# Patient Record
Sex: Female | Born: 1945 | Race: White | Hispanic: No | Marital: Married | Smoking: Former smoker
Health system: Southern US, Community
[De-identification: ages and names within clinical notes are randomized; demographics above are authoritative.]

## PROBLEM LIST (undated history)

## (undated) DIAGNOSIS — F32A Depression, unspecified: Secondary | ICD-10-CM

## (undated) DIAGNOSIS — I10 Essential (primary) hypertension: Secondary | ICD-10-CM

## (undated) DIAGNOSIS — F419 Anxiety disorder, unspecified: Secondary | ICD-10-CM

## (undated) DIAGNOSIS — F329 Major depressive disorder, single episode, unspecified: Secondary | ICD-10-CM

## (undated) DIAGNOSIS — I509 Heart failure, unspecified: Secondary | ICD-10-CM

## (undated) HISTORY — DX: Anxiety disorder, unspecified: F41.9

## (undated) HISTORY — PX: TONSILLECTOMY AND ADENOIDECTOMY: SUR1326

## (undated) HISTORY — PX: ABDOMINAL HYSTERECTOMY: SHX81

## (undated) HISTORY — DX: Essential (primary) hypertension: I10

## (undated) HISTORY — DX: Heart failure, unspecified: I50.9

## (undated) HISTORY — DX: Depression, unspecified: F32.A

## (undated) HISTORY — DX: Major depressive disorder, single episode, unspecified: F32.9

---

## 2006-06-13 ENCOUNTER — Ambulatory Visit: Payer: Self-pay | Admitting: Cardiology

## 2006-06-19 ENCOUNTER — Ambulatory Visit: Payer: Self-pay | Admitting: Cardiology

## 2006-06-26 ENCOUNTER — Ambulatory Visit: Payer: Self-pay | Admitting: Cardiology

## 2006-07-01 ENCOUNTER — Ambulatory Visit: Payer: Self-pay | Admitting: Cardiology

## 2008-09-26 ENCOUNTER — Ambulatory Visit: Payer: Self-pay | Admitting: Cardiology

## 2014-02-04 ENCOUNTER — Other Ambulatory Visit: Payer: Self-pay | Admitting: *Deleted

## 2014-02-04 ENCOUNTER — Inpatient Hospital Stay
Admission: RE | Admit: 2014-02-04 | Discharge: 2014-02-23 | Disposition: A | Discharge status: Home / Self Care | Payer: Medicare Other | Source: Ambulatory Visit | Attending: Internal Medicine | Admitting: Internal Medicine

## 2014-02-04 MED ORDER — ALPRAZOLAM 0.25 MG PO TABS: ORAL_TABLET | ORAL | Status: DC

## 2014-02-04 NOTE — Telephone Encounter (Signed)
rx faxed to [facility] @ [phone] 

## 2014-02-05 LAB — GLUCOSE, CAPILLARY
GLUCOSE-CAPILLARY: 171 mg/dL — AB (ref 70–99)
Glucose-Capillary: 181 mg/dL — ABNORMAL HIGH (ref 70–99)
Glucose-Capillary: 188 mg/dL — ABNORMAL HIGH (ref 70–99)

## 2014-02-06 LAB — GLUCOSE, CAPILLARY
GLUCOSE-CAPILLARY: 206 mg/dL — AB (ref 70–99)
Glucose-Capillary: 131 mg/dL — ABNORMAL HIGH (ref 70–99)
Glucose-Capillary: 252 mg/dL — ABNORMAL HIGH (ref 70–99)

## 2014-02-07 ENCOUNTER — Non-Acute Institutional Stay (SKILLED_NURSING_FACILITY): Payer: Medicare Other | Admitting: Internal Medicine

## 2014-02-07 ENCOUNTER — Encounter: Payer: Self-pay | Admitting: Internal Medicine

## 2014-02-07 DIAGNOSIS — F3289 Other specified depressive episodes: Secondary | ICD-10-CM

## 2014-02-07 DIAGNOSIS — F32A Depression, unspecified: Secondary | ICD-10-CM

## 2014-02-07 DIAGNOSIS — F329 Major depressive disorder, single episode, unspecified: Secondary | ICD-10-CM

## 2014-02-07 DIAGNOSIS — K7460 Unspecified cirrhosis of liver: Secondary | ICD-10-CM | POA: Insufficient documentation

## 2014-02-07 DIAGNOSIS — F411 Generalized anxiety disorder: Secondary | ICD-10-CM | POA: Insufficient documentation

## 2014-02-07 DIAGNOSIS — E039 Hypothyroidism, unspecified: Secondary | ICD-10-CM

## 2014-02-07 DIAGNOSIS — G459 Transient cerebral ischemic attack, unspecified: Secondary | ICD-10-CM

## 2014-02-07 DIAGNOSIS — I5030 Unspecified diastolic (congestive) heart failure: Secondary | ICD-10-CM

## 2014-02-07 DIAGNOSIS — I509 Heart failure, unspecified: Secondary | ICD-10-CM

## 2014-02-07 DIAGNOSIS — J189 Pneumonia, unspecified organism: Secondary | ICD-10-CM | POA: Insufficient documentation

## 2014-02-07 LAB — GLUCOSE, CAPILLARY
GLUCOSE-CAPILLARY: 216 mg/dL — AB (ref 70–99)
Glucose-Capillary: 206 mg/dL — ABNORMAL HIGH (ref 70–99)
Glucose-Capillary: 112 mg/dL — ABNORMAL HIGH (ref 70–99)
Glucose-Capillary: 108 mg/dL — ABNORMAL HIGH (ref 70–99)
Glucose-Capillary: 92 mg/dL (ref 70–99)

## 2014-02-07 NOTE — Progress Notes (Signed)
Patient ID: KESTREL MIS, female   DOB: 06/22/1946, 68 y.o.   MRN: [id number]   This is an acute visit.  Level of care skilled.  Facility [facility].  Chief complaint-acute visit status post hospitalization for CHF-pneumonia.  History of present illness.  Patient is a very pleasant 68 year old female who was admitted to [facility] with worsening shortness of breath and slurred speech.  There was concern for TIA initially-MRI only showed small vessel ischemia no definite CVA.  She also had a CT of the chest done-she was diagnosed with diastolic CHF as well as pneumonia.  She also received an abdominal ultrasound that showed mild ascites and cirrhosis of the liver.  Her ammonia level was slightly elevated at 46.  In regards to the CHF she was treated with diuresis Lasix 80 mg twice a day she has been discharged on 60 mg twice a day.  Aldactone and propanolol also were added  Cardiac echo did show a normal ejection fraction.  Patient was also seen by gastroenterologist and recommendation for followup for a colonoscopy.  Patient was also seen by pulmonology-she continues on Duo nebs 4 times a day for shortness of breath and also is completing a course of Levaquin for the pneumonia.  She is here for rehabilitation  currently lives in a duplex near this facility with her husband.  Previous medical history.  CHF likely diastolic.  Pneumonia resolving.  Cirrhosis via abdominal ultrasound.  Diabetes type 2 diet controlled.  Hypertension  Apparently some previous history of TIA.  GERD.  Vitamin D deficiency.  Hypothyroidism.  Deviated nasal septum.  Fibromyalgia.  Hyperlipidemia.  History of degenerative disc disease.  Depression.  Anxiety.  Seasonal allergies.  Urinary incontinence.  Previous surgeries.  Tonsillectomy -- adenoidectomy-hysterectomy  Family history. Father with history of MI and kidney problems.  Mother with history of lung  cancer  .  Social history.  She is a former smoker no significant history of alcohol or illicit drug use.  No particular dust or fume exposure that patient is aware of.  She is married and lives with her husband in a duplex near this facility.  She has 4 children 3 are still living    Hospital studies as noted below.  Cardio echo showed normal ejection fraction-with an ejection fraction of 65% and aortic valve gradient of 16 mm HG  aortic valve appeared to be calcified.  . CT of the chest did not show any evidence of pulmonary embolism did show bilateral pleural effusions with airspace disease consistent with pulmonary edema and heart failure although pneumonia not excluded.  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  MRI of the brain showed small vessel ischemia otherwise negative for acute process.  Medications.  Levaquin 500 mg daily until 02/11/2014.  Lasix 60 mg twice a day.  Potassium 10 mEq daily.  Effexor XR 75 mg twice a day.  Duo-nebs 4 times a day when necessary.  Aspirin 81 mg daily.  Aldactone 12.5 mg twice a day.  Tylenol 650 mg every 4 hours when necessary fever or pain.  Synthroid 112 mcg daily  Propanolol 10 mg twice a day.  Symbicort 160-4.5 mcg actuation 2 puffs twice a day inhale  Xanax 0.25 mg when necessary 3 times a day.  Review of systems.  In general no complaints of fever chills.  Skin does not complaining of any rash or itching.  Head ears eyes nose mouth and  throat-does not complaining of visual changes or sore throat.  Respiratory-history of pneumonia CHF but does not complaining of shortness of breath or cough currently.  Cardiac-does not complaining of chest pain or significant lower extremity edema.  GI-no complaints of nausea vomiting diarrhea constipation or abdominal pain.  Muscle skeletal-is not complaining of joint pain does have a history of DJD currently  controlled on current pain medications.  Neurologic-no complaints of syncope headache dizziness or numbness.  Psych does have a history of anxiety and depression at this point does not complaining of either   Physical exam.  Temperature is 97.7 pulse 56 respirations 20 blood pressure 127/74 O2 saturation is 95% on room air weight is 210.  In general this is a somewhat obese elderly female in no distress sitting comfortably in her chair.  Her skin is warm and dry.  Eyes pupils appear reactive to light sclera and conjunctiva are clear visual acuity appears grossly intact extraocular movements intact.  Oropharynx is clear mucous membranes moist.  Chest is clear to auscultation there is no labored breathing.  Heart is regular rate and rhythm without murmur gallop or rub she is slightly bradycardic in the 50s--quite minimal lower extremity edema.  Abdomen is soft nontender with positive bowel sounds somewhat obese.  Muscle skeletal-moves all extremities x4 I do not note any deformities she does ambulate with the assistance of a cane-grip strength appears to be intact and appropriate.  Neurologic is grossly intact speech is clear no lateralizing findings.  Psych she is alert and oriented x3 pleasant and appropriate.  Labs.  02/07/2014.  WBC 6.9 hemoglobin 10.1 platelets 155.  Sodium 137 potassium 4.2 BUN 16 creatinine 0.8.  Liver function tests within normal limits except a bilirubin of 1.4 AST of 43 and albumin of 2.8   Assessment and plan.  #1-history of diastolic CHF- continues on Lasix with potassium supplementation we'll order weights  Monday Wednesday Friday notify provider gain greater than 3 pounds Continues on propanolol as well as Aldactone as well.  #2-history of pneumonia she is completing course of Levaquin this appears to be stable and resolving clinically she is on nebulizers as needed as well as on Symbicort routinely.  History of diabetes type 2 apparently  this is diet controlled CBGs so far appear to be in the low 100s in the morning-more mid 100s at noon-higher 100s to 200 range at 4 PM as well as at at bedtime we'll continue to monitor for now  #4-history hypothyroidism-she is on Synthroid we'll update TSH.  #5-history of cirrhosis of liver-recommendation for outpatient GI consultation this has been written apparently she is scheduled to at some point get a colonoscopy as well--he  Also will update a CBC and liver function tests.  #6 past history depression she continues on Effexor will have  nursing staff check to make sure she is on the correct dose of twice a day this appears to be stable clinically.  #7-anxiety she is on Xanax as needed again this appears clinically to be stable.  #8 past history of anemia-hemoglobin appears to be stable - there was also some thrombocytopenia in hospital again this will be rechecked  #9-history of TIA-she is on aspirin   CPT--99310-of note greater than 40 minutes spent assessing patient-reviewing her hospital records- and coordinating and formulating a plan of care-of note greater than 50% of time spent coordinating plan of care.  .Marland Kitchen

## 2014-02-08 ENCOUNTER — Encounter: Payer: Self-pay | Admitting: Internal Medicine

## 2014-02-08 LAB — GLUCOSE, CAPILLARY
GLUCOSE-CAPILLARY: 237 mg/dL — AB (ref 70–99)
GLUCOSE-CAPILLARY: 307 mg/dL — AB (ref 70–99)
GLUCOSE-CAPILLARY: 269 mg/dL — AB (ref 70–99)
Glucose-Capillary: 138 mg/dL — ABNORMAL HIGH (ref 70–99)
Glucose-Capillary: 116 mg/dL — ABNORMAL HIGH (ref 70–99)
Glucose-Capillary: 270 mg/dL — ABNORMAL HIGH (ref 70–99)

## 2014-02-10 LAB — GLUCOSE, CAPILLARY
GLUCOSE-CAPILLARY: 172 mg/dL — AB (ref 70–99)
GLUCOSE-CAPILLARY: 269 mg/dL — AB (ref 70–99)
Glucose-Capillary: 162 mg/dL — ABNORMAL HIGH (ref 70–99)
Glucose-Capillary: 139 mg/dL — ABNORMAL HIGH (ref 70–99)
Glucose-Capillary: 181 mg/dL — ABNORMAL HIGH (ref 70–99)
Glucose-Capillary: 172 mg/dL — ABNORMAL HIGH (ref 70–99)
Glucose-Capillary: 258 mg/dL — ABNORMAL HIGH (ref 70–99)

## 2014-02-11 LAB — GLUCOSE, CAPILLARY
GLUCOSE-CAPILLARY: 200 mg/dL — AB (ref 70–99)
GLUCOSE-CAPILLARY: 203 mg/dL — AB (ref 70–99)
Glucose-Capillary: 101 mg/dL — ABNORMAL HIGH (ref 70–99)
Glucose-Capillary: 184 mg/dL — ABNORMAL HIGH (ref 70–99)

## 2014-02-12 LAB — GLUCOSE, CAPILLARY
Glucose-Capillary: 106 mg/dL — ABNORMAL HIGH (ref 70–99)
Glucose-Capillary: 154 mg/dL — ABNORMAL HIGH (ref 70–99)
Glucose-Capillary: 202 mg/dL — ABNORMAL HIGH (ref 70–99)
Glucose-Capillary: 228 mg/dL — ABNORMAL HIGH (ref 70–99)

## 2014-02-13 LAB — GLUCOSE, CAPILLARY
GLUCOSE-CAPILLARY: 105 mg/dL — AB (ref 70–99)
GLUCOSE-CAPILLARY: 311 mg/dL — AB (ref 70–99)

## 2014-02-14 LAB — GLUCOSE, CAPILLARY
GLUCOSE-CAPILLARY: 152 mg/dL — AB (ref 70–99)
GLUCOSE-CAPILLARY: 158 mg/dL — AB (ref 70–99)
GLUCOSE-CAPILLARY: 261 mg/dL — AB (ref 70–99)
Glucose-Capillary: 246 mg/dL — ABNORMAL HIGH (ref 70–99)
Glucose-Capillary: 114 mg/dL — ABNORMAL HIGH (ref 70–99)
Glucose-Capillary: 121 mg/dL — ABNORMAL HIGH (ref 70–99)
Glucose-Capillary: 141 mg/dL — ABNORMAL HIGH (ref 70–99)

## 2014-02-15 LAB — GLUCOSE, CAPILLARY
GLUCOSE-CAPILLARY: 229 mg/dL — AB (ref 70–99)
Glucose-Capillary: 114 mg/dL — ABNORMAL HIGH (ref 70–99)
Glucose-Capillary: 187 mg/dL — ABNORMAL HIGH (ref 70–99)
Glucose-Capillary: 142 mg/dL — ABNORMAL HIGH (ref 70–99)

## 2014-02-16 LAB — GLUCOSE, CAPILLARY
GLUCOSE-CAPILLARY: 211 mg/dL — AB (ref 70–99)
Glucose-Capillary: 129 mg/dL — ABNORMAL HIGH (ref 70–99)
Glucose-Capillary: 117 mg/dL — ABNORMAL HIGH (ref 70–99)

## 2014-02-17 LAB — GLUCOSE, CAPILLARY
GLUCOSE-CAPILLARY: 158 mg/dL — AB (ref 70–99)
GLUCOSE-CAPILLARY: 199 mg/dL — AB (ref 70–99)
Glucose-Capillary: 194 mg/dL — ABNORMAL HIGH (ref 70–99)
Glucose-Capillary: 128 mg/dL — ABNORMAL HIGH (ref 70–99)
Glucose-Capillary: 217 mg/dL — ABNORMAL HIGH (ref 70–99)

## 2014-02-18 LAB — GLUCOSE, CAPILLARY
GLUCOSE-CAPILLARY: 119 mg/dL — AB (ref 70–99)
GLUCOSE-CAPILLARY: 254 mg/dL — AB (ref 70–99)
Glucose-Capillary: 183 mg/dL — ABNORMAL HIGH (ref 70–99)
Glucose-Capillary: 159 mg/dL — ABNORMAL HIGH (ref 70–99)

## 2014-02-19 LAB — GLUCOSE, CAPILLARY
GLUCOSE-CAPILLARY: 131 mg/dL — AB (ref 70–99)
GLUCOSE-CAPILLARY: 197 mg/dL — AB (ref 70–99)
GLUCOSE-CAPILLARY: 175 mg/dL — AB (ref 70–99)
Glucose-Capillary: 286 mg/dL — ABNORMAL HIGH (ref 70–99)

## 2014-02-20 LAB — GLUCOSE, CAPILLARY
GLUCOSE-CAPILLARY: 117 mg/dL — AB (ref 70–99)
Glucose-Capillary: 194 mg/dL — ABNORMAL HIGH (ref 70–99)
Glucose-Capillary: 171 mg/dL — ABNORMAL HIGH (ref 70–99)
Glucose-Capillary: 265 mg/dL — ABNORMAL HIGH (ref 70–99)

## 2014-02-21 LAB — GLUCOSE, CAPILLARY
GLUCOSE-CAPILLARY: 109 mg/dL — AB (ref 70–99)
Glucose-Capillary: 172 mg/dL — ABNORMAL HIGH (ref 70–99)
Glucose-Capillary: 157 mg/dL — ABNORMAL HIGH (ref 70–99)
Glucose-Capillary: 166 mg/dL — ABNORMAL HIGH (ref 70–99)

## 2014-02-22 ENCOUNTER — Non-Acute Institutional Stay (SKILLED_NURSING_FACILITY): Payer: Medicare Other | Admitting: Internal Medicine

## 2014-02-22 ENCOUNTER — Encounter: Payer: Self-pay | Admitting: Internal Medicine

## 2014-02-22 DIAGNOSIS — J189 Pneumonia, unspecified organism: Secondary | ICD-10-CM

## 2014-02-22 DIAGNOSIS — F32A Depression, unspecified: Secondary | ICD-10-CM

## 2014-02-22 DIAGNOSIS — I509 Heart failure, unspecified: Secondary | ICD-10-CM

## 2014-02-22 DIAGNOSIS — E039 Hypothyroidism, unspecified: Secondary | ICD-10-CM

## 2014-02-22 DIAGNOSIS — F411 Generalized anxiety disorder: Secondary | ICD-10-CM

## 2014-02-22 DIAGNOSIS — F3289 Other specified depressive episodes: Secondary | ICD-10-CM

## 2014-02-22 DIAGNOSIS — F329 Major depressive disorder, single episode, unspecified: Secondary | ICD-10-CM

## 2014-02-22 DIAGNOSIS — G459 Transient cerebral ischemic attack, unspecified: Secondary | ICD-10-CM

## 2014-02-22 DIAGNOSIS — I5030 Unspecified diastolic (congestive) heart failure: Secondary | ICD-10-CM

## 2014-02-22 DIAGNOSIS — K7460 Unspecified cirrhosis of liver: Secondary | ICD-10-CM

## 2014-02-22 LAB — GLUCOSE, CAPILLARY
GLUCOSE-CAPILLARY: 249 mg/dL — AB (ref 70–99)
Glucose-Capillary: 132 mg/dL — ABNORMAL HIGH (ref 70–99)
Glucose-Capillary: 198 mg/dL — ABNORMAL HIGH (ref 70–99)
Glucose-Capillary: 160 mg/dL — ABNORMAL HIGH (ref 70–99)

## 2014-02-22 NOTE — Progress Notes (Signed)
Patient ID: Nancy Baldwin, female   DOB: 15-Jan-1946, 68 y.o.   MRN: [id number]   This is a discharge note.  Level of care skilled.  Facility [facility].   Chief complain-discharge note   History of present illness .  Patient is a very pleasant 68 year old female who was admitted to [facility] with worsening shortness of breath and slurred speech.  There was concern for TIA initially-MRI only showed small vessel ischemia no definite CVA.  She also had a CT of the chest done-she was diagnosed with diastolic CHF as well as pneumonia.  She also received an abdominal ultrasound that showed mild ascites and cirrhosis of the liver.  Her ammonia level was slightly elevated at 46.  In regards to the CHF she was treated with diuresis Lasix 80 mg twice a day she has been discharged on 60 mg twice a day.  Aldactone and propanolol also were added  Cardiac echo did show a normal ejection fraction.  Patient was also seen by gastroenterologist and recommendation for followup for a colonoscopy--they are  Following this.  Patient was also seen by pulmonology-she continues on Duo nebs 4 times a day for shortness of breath  But rarely needs this---and has completed course of Levaquin for the pneumonia.  She was here for rehabilitation currently lives in a duplex near this facility with her husband--who is very supportive in fact he is in the room with her today .  Previous medical history.  CHF likely diastolic.  Pneumonia resolving.  Cirrhosis via abdominal ultrasound.  Diabetes type 2 diet controlled.  Hypertension  Apparently some previous history of TIA.  GERD.  Vitamin D deficiency.  Hypothyroidism.  Deviated nasal septum.  Fibromyalgia.  Hyperlipidemia.  History of degenerative disc disease.  Depression.  Anxiety.  Seasonal allergies.  Urinary incontinence.  Previous surgeries.  Tonsillectomy -- adenoidectomy-hysterectomy  Family history.  Father with history of MI and kidney problems.    Mother with history of lung cancer  .  Social history.  She is a former smoker no significant history of alcohol or illicit drug use.  No particular dust or fume exposure that patient is aware of.  She is married and lives with her husband in a duplex near this facility.  She has 4 children 3 are still living   Hospital studies as noted below.  Cardio echo showed normal ejection fraction-with an ejection fraction of 65% and aortic valve gradient of 16 mm HG aortic valve appeared to be calcified.  .  CT of the chest did not show any evidence of pulmonary embolism did show bilateral pleural effusions with airspace disease consistent with pulmonary edema and heart failure although pneumonia not excluded.  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  MRI of the brain showed small vessel ischemia otherwise negative for acute process.   Medications.    Lasix 60 mg twice a day.  Potassium 10 mEq daily.  Effexor XR 75 mg twice a day.  Duo-nebs 4 times a day when necessary.  Aspirin 81 mg daily.  Aldactone 12.5 mg twice a day.  Tylenol 650 mg every 4 hours when necessary fever or pain.  Synthroid 112 mcg daily  Propanolol 10 mg twice a day.  Symbicort 160-4.5 mcg actuation 2 puffs twice a day inhale  Xanax 0.25 mg when necessary 3 times a day .  Review of systems.  In general no complaints of fever chills.  Skin  does not complaining of any rash or itching.  Head ears eyes nose mouth and throat-does not complaining of visual changes or sore throat.  Respiratory-history of pneumonia CHF but does not complaining of shortness of breath or cough currently.  Cardiac-does not complaining of chest pain or significant lower extremity edema.--no palpitations  GI-no complaints of nausea vomiting diarrhea constipation or abdominal pain.  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  Neurologic-no complaints of syncope headache dizziness or numbness.  Psych does have a history of anxiety and depression at this point does not complaining of either   Physical exam.  . temperature 97.6 pulse 68 respirations 20 blood pressure 130/44 weight is relatively stable at 214 she appears to fluctuate between 210-214 area  In general this is a somewhat obese elderly female in no distress s  Her skin is warm and dry.  Eyes pupils appear reactive to light sclera and conjunctiva are clear visual acuity appears grossly intact extraocular movements intact.  Oropharynx is clear mucous membranes moist.  Chest is clear to auscultation there is no labored breathing.  Heart is regular rate and rhythm without murmur gallop or rub --there is no lower extremity edema.  Abdomen is soft nontender with positive bowel sounds somewhat obese.  Muscle skeletal-moves all extremities x4 I do not note any deformities she does ambulate with the assistance of a cane-appears to ambulate fairly well  With some weakness... but would benefit from an assistance device i.e. rolling walker   Neurologic is grossly intact speech is clear no lateralizing findings.  Psych she is alert and oriented x3 pleasant and appropriate .  Labs. 02/10/2014.  WBC 7.7 hemoglobin 10.3 platelets 145.  Sodium 140 potassium 3.8 BUN 22 creatinine 0.97.  Liver function tests within normal limits except AST 39 and albumin of 2.9  TSH-3.392    02/07/2014.  WBC 6.9 hemoglobin 10.1 platelets 155.  Sodium 137 potassium 4.2 BUN 16 creatinine 0.8.  Liver function tests within normal limits except a bilirubin of 1.4 AST of 43 and albumin of 2.8   Assessment and plan .  #1-history of diastolic CHF- continues on Lasix with potassium supplementation --  Continues on propanolol as well as Aldactone as well-- she has been seen by cardiology and followup has been scheduled--Will update a metabolic panel.-- \ Clinically she  appears stable do not really see any significant edema no complaints of shortness of breath she does not really use her nebulizers anymore--does not require oxygen  #2-history of pneumonia she has completed a course of Levaquin this appears to be stable and resolving clinically she is on nebulizers as needed but not use these much at all as well as on Symbicort routinely.  History of diabetes type 2 apparently this is diet controlled --CBGs the morning  in the lower 100s and in the mid 100s-lower 200s later in the day will defer aggressive workup of this to her primary care provider #4-history hypothyroidism-she is on Synthroid TSH was within normal limits on lab done here  #5-history of cirrhosis of liver-recommendation for outpatient GI consultation--and she has seen them followup has been scheduled by GI   .  #6 past history depression she continues on Effexor-- this appears to be stable clinically.  #7-anxiety she is on Xanax as needed again this appears clinically to be stable.  #8 past history of anemia-hemoglobin appears to be stable - there was also some thrombocytopenia in  hospital  this will be rechecked  #9-history of TIA-she is on aspirin   10-history of deconditioning-she would benefit from continued PT and OT for her weakness also would benefit from home health to follow her numerous medical issues including CHF. Also will need a rolling walker secondary to her weakness with ambulation and fall risk  Again she will be going home to a house near the facility she lives with her husband  CPT--99316--of note greater than 30 minutes spent assessing patient and preparing this discharge summary-of note greater than 50% of time spent coordinating plan of care .  .Marland Kitchen

## 2014-02-23 LAB — GLUCOSE, CAPILLARY
Glucose-Capillary: 117 mg/dL — ABNORMAL HIGH (ref 70–99)
Glucose-Capillary: 173 mg/dL — ABNORMAL HIGH (ref 70–99)

## 2014-04-22 ENCOUNTER — Other Ambulatory Visit (HOSPITAL_BASED_OUTPATIENT_CLINIC_OR_DEPARTMENT_OTHER): Payer: Self-pay | Admitting: Internal Medicine

## 2014-08-15 ENCOUNTER — Other Ambulatory Visit (HOSPITAL_COMMUNITY): Payer: Self-pay | Admitting: Interventional Radiology

## 2014-08-15 ENCOUNTER — Telehealth (HOSPITAL_COMMUNITY): Payer: Self-pay | Admitting: Interventional Radiology

## 2014-08-15 DIAGNOSIS — M4854XA Collapsed vertebra, not elsewhere classified, thoracic region, initial encounter for fracture: Secondary | ICD-10-CM

## 2014-08-15 NOTE — Telephone Encounter (Signed)
Called pt, left VM for her to call to schedule new pt consult with Deveshwar. JM

## 2014-08-17 ENCOUNTER — Ambulatory Visit (HOSPITAL_COMMUNITY)
Admission: RE | Admit: 2014-08-17 | Discharge: 2014-08-17 | Disposition: A | Discharge status: Home / Self Care | Payer: Medicare Other | Source: Ambulatory Visit | Attending: Interventional Radiology | Admitting: Interventional Radiology

## 2014-08-17 DIAGNOSIS — M4854XA Collapsed vertebra, not elsewhere classified, thoracic region, initial encounter for fracture: Secondary | ICD-10-CM

## 2014-09-08 ENCOUNTER — Other Ambulatory Visit (HOSPITAL_COMMUNITY): Payer: Self-pay | Admitting: Interventional Radiology

## 2014-09-08 DIAGNOSIS — IMO0002 Reserved for concepts with insufficient information to code with codable children: Secondary | ICD-10-CM

## 2014-09-08 DIAGNOSIS — M546 Pain in thoracic spine: Secondary | ICD-10-CM

## 2014-09-16 ENCOUNTER — Other Ambulatory Visit: Payer: Self-pay | Admitting: Radiology

## 2014-09-21 ENCOUNTER — Other Ambulatory Visit: Payer: Self-pay | Admitting: Radiology

## 2014-09-22 ENCOUNTER — Ambulatory Visit (HOSPITAL_COMMUNITY)
Admission: RE | Admit: 2014-09-22 | Discharge: 2014-09-22 | Disposition: A | Discharge status: Home / Self Care | Payer: Medicare Other | Source: Ambulatory Visit | Attending: Interventional Radiology | Admitting: Interventional Radiology

## 2014-09-22 ENCOUNTER — Other Ambulatory Visit (HOSPITAL_COMMUNITY): Payer: Self-pay | Admitting: Interventional Radiology

## 2014-09-22 DIAGNOSIS — S22000S Wedge compression fracture of unspecified thoracic vertebra, sequela: Secondary | ICD-10-CM | POA: Diagnosis present

## 2014-09-22 DIAGNOSIS — I10 Essential (primary) hypertension: Secondary | ICD-10-CM | POA: Insufficient documentation

## 2014-09-22 DIAGNOSIS — M546 Pain in thoracic spine: Secondary | ICD-10-CM

## 2014-09-22 DIAGNOSIS — F419 Anxiety disorder, unspecified: Secondary | ICD-10-CM | POA: Insufficient documentation

## 2014-09-22 DIAGNOSIS — F329 Major depressive disorder, single episode, unspecified: Secondary | ICD-10-CM | POA: Insufficient documentation

## 2014-09-22 DIAGNOSIS — Z87891 Personal history of nicotine dependence: Secondary | ICD-10-CM | POA: Diagnosis not present

## 2014-09-22 DIAGNOSIS — Y92009 Unspecified place in unspecified non-institutional (private) residence as the place of occurrence of the external cause: Secondary | ICD-10-CM | POA: Insufficient documentation

## 2014-09-22 DIAGNOSIS — S22060S Wedge compression fracture of T7-T8 vertebra, sequela: Secondary | ICD-10-CM | POA: Insufficient documentation

## 2014-09-22 DIAGNOSIS — W19XXXA Unspecified fall, initial encounter: Secondary | ICD-10-CM | POA: Diagnosis not present

## 2014-09-22 DIAGNOSIS — I509 Heart failure, unspecified: Secondary | ICD-10-CM | POA: Diagnosis not present

## 2014-09-22 DIAGNOSIS — IMO0002 Reserved for concepts with insufficient information to code with codable children: Secondary | ICD-10-CM

## 2014-09-22 LAB — BASIC METABOLIC PANEL
ANION GAP: 10 (ref 5–15)
BUN: 24 mg/dL — ABNORMAL HIGH (ref 6–23)
CO2: 30 mEq/L (ref 19–32)
Calcium: 9.3 mg/dL (ref 8.4–10.5)
Chloride: 101 mEq/L (ref 96–112)
Creatinine, Ser: 1.17 mg/dL — ABNORMAL HIGH (ref 0.50–1.10)
GFR calc Af Amer: 55 mL/min — ABNORMAL LOW (ref >90)
GFR, EST NON AFRICAN AMERICAN: 47 mL/min — AB (ref >90)
Glucose, Bld: 127 mg/dL — ABNORMAL HIGH (ref 70–99)
Potassium: 4.2 mEq/L (ref 3.7–5.3)
SODIUM: 141 meq/L (ref 137–147)

## 2014-09-22 LAB — CBC
HCT: 38.3 % (ref 36.0–46.0)
Hemoglobin: 12.3 g/dL (ref 12.0–15.0)
MCH: 31 pg (ref 26.0–34.0)
MCHC: 32.1 g/dL (ref 30.0–36.0)
MCV: 96.5 fL (ref 78.0–100.0)
PLATELETS: 100 10*3/uL — AB (ref 150–400)
RBC: 3.97 MIL/uL (ref 3.87–5.11)
RDW: 15.1 % (ref 11.5–15.5)
WBC: 5.7 10*3/uL (ref 4.0–10.5)

## 2014-09-22 LAB — GLUCOSE, CAPILLARY: Glucose-Capillary: 116 mg/dL — ABNORMAL HIGH (ref 70–99)

## 2014-09-22 LAB — PROTIME-INR
INR: 1.4 (ref 0.00–1.49)
PROTHROMBIN TIME: 17.3 s — AB (ref 11.6–15.2)

## 2014-09-22 LAB — APTT: aPTT: 30 seconds (ref 24–37)

## 2014-09-22 MED ORDER — VANCOMYCIN HCL IN DEXTROSE 1-5 GM/200ML-% IV SOLN
1000.0000 mg | Freq: Once | INTRAVENOUS | Status: AC
  Administered 2014-09-22: 1000 mg via INTRAVENOUS
  Filled 2014-09-22: qty 200

## 2014-09-22 MED ORDER — TOBRAMYCIN SULFATE 1.2 G IJ SOLR
INTRAMUSCULAR | Status: AC
  Filled 2014-09-22: qty 1.2

## 2014-09-22 MED ORDER — IOHEXOL 300 MG/ML  SOLN
50.0000 mL | Freq: Once | INTRAMUSCULAR | Status: AC | PRN
  Administered 2014-09-22: 5 mL

## 2014-09-22 MED ORDER — HYDROMORPHONE HCL 1 MG/ML IJ SOLN
INTRAMUSCULAR | Status: AC
  Filled 2014-09-22: qty 2

## 2014-09-22 MED ORDER — SODIUM CHLORIDE 0.9 % IV SOLN
Freq: Once | INTRAVENOUS | Status: AC
  Administered 2014-09-22: 1000 mL via INTRAVENOUS

## 2014-09-22 MED ORDER — FENTANYL CITRATE 0.05 MG/ML IJ SOLN
INTRAMUSCULAR | Status: AC
  Filled 2014-09-22: qty 4

## 2014-09-22 MED ORDER — FENTANYL CITRATE 0.05 MG/ML IJ SOLN
INTRAMUSCULAR | Status: DC | PRN
  Administered 2014-09-22: 12.5 ug via INTRAVENOUS
  Administered 2014-09-22: 25 ug via INTRAVENOUS

## 2014-09-22 MED ORDER — MIDAZOLAM HCL 2 MG/2ML IJ SOLN
INTRAMUSCULAR | Status: DC | PRN
  Administered 2014-09-22: 0.5 mg via INTRAVENOUS
  Administered 2014-09-22: 1 mg via INTRAVENOUS

## 2014-09-22 MED ORDER — BUPIVACAINE HCL (PF) 0.25 % IJ SOLN
INTRAMUSCULAR | Status: AC
  Filled 2014-09-22: qty 30

## 2014-09-22 MED ORDER — MIDAZOLAM HCL 5 MG/ML IJ SOLN
INTRAMUSCULAR | Status: AC
  Filled 2014-09-22: qty 1

## 2014-09-22 MED ORDER — GELATIN ABSORBABLE 12-7 MM EX MISC
CUTANEOUS | Status: AC
  Filled 2014-09-22: qty 1

## 2014-09-22 MED ORDER — SODIUM CHLORIDE 0.9 % IV SOLN: INTRAVENOUS | Status: AC

## 2014-09-22 NOTE — Sedation Documentation (Signed)
Patient denies pain and is resting comfortably.  

## 2014-09-22 NOTE — Discharge Instructions (Signed)
1.No stooping,bending or lifting more than 10 lbs for 2 weeks 2.Use walker for ambulation for 2 weeks. 3.RTC in 2 weeks  KYPHOPLASTY/VERTEBROPLASTY DISCHARGE INSTRUCTIONS  Medications: (check all that apply)     Resume all home medications as before procedure.       Resume your (aspirin/Plavix/Coumadin) on [phone].                  Continue your pain medications as prescribed as needed.  Over the next 3-5 days, decrease your pain medication as tolerated.  Over the counter medications (i.e. Tylenol, ibuprofen, and aleve) may be substituted once severe/moderate pain symptoms have subsided.   Wound Care: - Bandages may be removed the day following your procedure.  You may get your incision wet once bandages are removed.  Bandaids may be used to cover the incisions until scab formation.  Topical ointments are optional.  - If you develop a fever greater than 101 degrees, have increased skin redness at the incision sites or pus-like oozing from incisions occurring within 1 week of the procedure, contact radiology at [phone] or [phone].  - Ice pack to back for 15-20 minutes 2-3 time per day for first 2-3 days post procedure.  The ice will expedite muscle healing and help with the pain from the incisions.   Activity: - Bedrest today with limited activity for 24 hours post procedure.  - No driving for 48 hours.  - Increase your activity as tolerated after bedrest (with assistance if necessary).  - Refrain from any strenuous activity or heavy lifting (greater than 10 lbs.).   Follow up: - Contact radiology at [phone] or [phone] if any questions/concerns.  - A physician assistant from radiology will contact you in approximately 1 week.  - If a biopsy was performed at the time of your procedure, your referring physician should receive the results in usually 2-3 days.

## 2014-09-22 NOTE — H&P (Signed)
Chief Complaint: Continued back pain Worsening pain T7/T8 fx  Referring Physician(s): Deveshwar,Sanjeev K  History of Present Illness: Nancy Baldwin is a 68 y.o. female   Pt fell at home in June 2015 Has had continued and worsening back pain MRI reveals acute fracture at Thoracic 7 and 8 Consulted with Dr Corliss Skainseveshwar Now scheduled for T7/8 vertebroplasty/kyphoplasty   Past Medical History  Diagnosis Date  . CHF (congestive heart failure)   . Depression   . Hypertension   . Anxiety   . Lung cancer     Past Surgical History  Procedure Laterality Date  . Abdominal hysterectomy    . Tonsillectomy and adenoidectomy      Allergies: Amoxicillin; Codeine; Decongest-aid; Decongestant; Metformin and related; and Tricor  Medications: Prior to Admission medications   Medication Sig Start Date End Date Taking? Authorizing Provider  acetaminophen (TYLENOL) 325 MG tablet Take 650 mg by mouth every 4 (four) hours as needed.   Yes Historical Provider, MD  ALPRAZolam (XANAX) 0.25 MG tablet Take 1 tablet by mouth 3 times daily as needed for Anxiety. 02/04/14  Yes Arlo C Trudie ReedLassen, PA-C  aspirin 81 MG tablet Take 81 mg by mouth daily.   Yes Historical Provider, MD  budesonide-formoterol (SYMBICORT) 160-4.5 MCG/ACT inhaler Inhale 2 puffs into the lungs 2 (two) times daily.   Yes Historical Provider, MD  furosemide (LASIX) 20 MG tablet Take 60 mg by mouth 2 (two) times daily.    Yes Historical Provider, MD  insulin glargine (LANTUS) 100 unit/mL SOPN Inject 30 Units into the skin daily before breakfast.   Yes Historical Provider, MD  ipratropium-albuterol (DUONEB) 0.5-2.5 (3) MG/3ML SOLN Take 3 mLs by nebulization every 6 (six) hours as needed.   Yes Historical Provider, MD  levothyroxine (SYNTHROID, LEVOTHROID) 112 MCG tablet Take 112 mcg by mouth daily before breakfast.   Yes Historical Provider, MD  levothyroxine (SYNTHROID, LEVOTHROID) 88 MCG tablet Take 88 mcg by mouth daily before  breakfast.   Yes Historical Provider, MD  potassium chloride (K-DUR) 10 MEQ tablet Take 10 mEq by mouth daily.   Yes Historical Provider, MD  propranolol (INDERAL) 10 MG tablet Take 10 mg by mouth 2 (two) times daily.   Yes Historical Provider, MD  propranolol ER (INDERAL LA) 60 MG 24 hr capsule Take 60 mg by mouth daily.   Yes Historical Provider, MD  spironolactone (ALDACTONE) 25 MG tablet Take 12.5 mg by mouth 2 (two) times daily.   Yes Historical Provider, MD  venlafaxine XR (EFFEXOR-XR) 75 MG 24 hr capsule Take 75 mg by mouth 2 (two) times daily.   Yes Historical Provider, MD    Family History  Problem Relation Age of Onset  . Lung cancer Mother   . Heart disease Father   . Kidney disease Father   . Heart attack Father     History   Social History  . Marital Status: Married    Spouse Name: N/A    Number of Children: N/A  . Years of Education: N/A   Social History Main Topics  . Smoking status: Former Games developermoker  . Smokeless tobacco: Not on file  . Alcohol Use: No  . Drug Use: No  . Sexual Activity: Not on file   Other Topics Concern  . Not on file   Social History Narrative  . No narrative on file     Review of Systems: A 12 point ROS discussed and pertinent positives are indicated in the HPI above.  All other  systems are negative.  Review of Systems  Constitutional: Positive for activity change and fatigue. Negative for appetite change.  Respiratory: Negative for cough and shortness of breath.   Cardiovascular: Negative for chest pain.  Gastrointestinal: Negative for abdominal pain.  Genitourinary: Negative for difficulty urinating.  Musculoskeletal: Positive for back pain.  Neurological: Positive for weakness. Negative for dizziness, facial asymmetry and headaches.  Psychiatric/Behavioral: Negative for behavioral problems and confusion.    Vital Signs: BP 112/57 mmHg  Pulse 49  Temp(Src) 97.7 F (36.5 C) (Oral)  Resp 20  Ht 5\' 6"  (1.676 m)  Wt 93.895 kg  (207 lb)  BMI 33.43 kg/m2  SpO2 100%  Physical Exam  Constitutional: She is oriented to person, place, and time. She appears well-nourished.  Cardiovascular: Normal rate and regular rhythm.   Murmur heard. Pulmonary/Chest: Effort normal and breath sounds normal. She has no wheezes.  Abdominal: Soft. Bowel sounds are normal. There is no tenderness.  Musculoskeletal: Normal range of motion.  Back pain  Neurological: She is alert and oriented to person, place, and time.  Skin: Skin is warm and dry.  Psychiatric: She has a normal mood and affect. Her behavior is normal. Judgment and thought content normal.  Nursing note and vitals reviewed.   Imaging: No results found.  Labs:  CBC:  Recent Labs  09/22/14 0718  WBC 5.7  HGB 12.3  HCT 38.3  PLT PENDING    COAGS: No results for input(s): INR, APTT in the last 8760 hours.  BMP:  Recent Labs  09/22/14 0718  NA 141  K 4.2  CL 101  CO2 30  GLUCOSE 127*  BUN 24*  CALCIUM 9.3  CREATININE 1.17*  GFRNONAA 47*  GFRAA 55*    LIVER FUNCTION TESTS: No results for input(s): BILITOT, AST, ALT, ALKPHOS, PROT, ALBUMIN in the last 8760 hours.  TUMOR MARKERS: No results for input(s): AFPTM, CEA, CA199, CHROMGRNA in the last 8760 hours.  Assessment and Plan:  Worsening back pain post fall 04/2014 MRI reveals fx T7/8 Now scheduled for VP/KP Pt aware of procedure benefits and risks and agreeable to proceed Consent signed andin chart  Thank you for this interesting consult.  I greatly enjoyed meeting Nancy Baldwin and look forward to participating in their care.    I spent a total of 20 minutes face to face in clinical consultation, greater than 50% of which was counseling/coordinating care for T7/8 VP/KP  Signed: Jonell Krontz A 09/22/2014, 8:08 AM

## 2014-09-22 NOTE — Procedures (Signed)
S/P T7 and T 8 VP 

## 2014-09-27 ENCOUNTER — Other Ambulatory Visit (HOSPITAL_COMMUNITY): Payer: Self-pay | Admitting: Interventional Radiology

## 2014-09-27 DIAGNOSIS — S22000A Wedge compression fracture of unspecified thoracic vertebra, initial encounter for closed fracture: Secondary | ICD-10-CM

## 2014-12-02 ENCOUNTER — Ambulatory Visit (HOSPITAL_COMMUNITY): Admission: RE | Admit: 2014-12-02 | Payer: Self-pay | Source: Ambulatory Visit

## 2014-12-07 ENCOUNTER — Ambulatory Visit (HOSPITAL_COMMUNITY)
Admission: RE | Admit: 2014-12-07 | Discharge: 2014-12-07 | Disposition: A | Discharge status: Home / Self Care | Payer: Medicare Other | Source: Ambulatory Visit | Attending: Interventional Radiology | Admitting: Interventional Radiology

## 2014-12-07 DIAGNOSIS — S22000A Wedge compression fracture of unspecified thoracic vertebra, initial encounter for closed fracture: Secondary | ICD-10-CM

## 2017-02-18 ENCOUNTER — Emergency Department (HOSPITAL_COMMUNITY): Payer: Medicare Other

## 2017-02-18 ENCOUNTER — Emergency Department (HOSPITAL_COMMUNITY)
Admission: EM | Admit: 2017-02-18 | Discharge: 2017-02-18 | Disposition: A | Discharge status: Home / Self Care | Payer: Medicare Other | Attending: Emergency Medicine | Admitting: Emergency Medicine

## 2017-02-18 ENCOUNTER — Encounter (HOSPITAL_COMMUNITY): Payer: Self-pay

## 2017-02-18 DIAGNOSIS — W182XXA Fall in (into) shower or empty bathtub, initial encounter: Secondary | ICD-10-CM | POA: Insufficient documentation

## 2017-02-18 DIAGNOSIS — S8002XA Contusion of left knee, initial encounter: Secondary | ICD-10-CM | POA: Diagnosis not present

## 2017-02-18 DIAGNOSIS — Z7982 Long term (current) use of aspirin: Secondary | ICD-10-CM | POA: Diagnosis not present

## 2017-02-18 DIAGNOSIS — S8001XA Contusion of right knee, initial encounter: Secondary | ICD-10-CM | POA: Diagnosis not present

## 2017-02-18 DIAGNOSIS — Z79899 Other long term (current) drug therapy: Secondary | ICD-10-CM | POA: Diagnosis not present

## 2017-02-18 DIAGNOSIS — S42201A Unspecified fracture of upper end of right humerus, initial encounter for closed fracture: Secondary | ICD-10-CM | POA: Insufficient documentation

## 2017-02-18 DIAGNOSIS — I110 Hypertensive heart disease with heart failure: Secondary | ICD-10-CM | POA: Diagnosis not present

## 2017-02-18 DIAGNOSIS — Z794 Long term (current) use of insulin: Secondary | ICD-10-CM | POA: Insufficient documentation

## 2017-02-18 DIAGNOSIS — I509 Heart failure, unspecified: Secondary | ICD-10-CM | POA: Diagnosis not present

## 2017-02-18 DIAGNOSIS — W19XXXA Unspecified fall, initial encounter: Secondary | ICD-10-CM

## 2017-02-18 DIAGNOSIS — Y92009 Unspecified place in unspecified non-institutional (private) residence as the place of occurrence of the external cause: Secondary | ICD-10-CM

## 2017-02-18 DIAGNOSIS — Z87891 Personal history of nicotine dependence: Secondary | ICD-10-CM | POA: Insufficient documentation

## 2017-02-18 DIAGNOSIS — Y929 Unspecified place or not applicable: Secondary | ICD-10-CM | POA: Insufficient documentation

## 2017-02-18 DIAGNOSIS — S42291A Other displaced fracture of upper end of right humerus, initial encounter for closed fracture: Secondary | ICD-10-CM

## 2017-02-18 DIAGNOSIS — Y93E1 Activity, personal bathing and showering: Secondary | ICD-10-CM | POA: Insufficient documentation

## 2017-02-18 DIAGNOSIS — S8000XA Contusion of unspecified knee, initial encounter: Secondary | ICD-10-CM

## 2017-02-18 DIAGNOSIS — S6991XA Unspecified injury of right wrist, hand and finger(s), initial encounter: Secondary | ICD-10-CM | POA: Diagnosis present

## 2017-02-18 DIAGNOSIS — Y999 Unspecified external cause status: Secondary | ICD-10-CM | POA: Insufficient documentation

## 2017-02-18 MED ORDER — OXYCODONE-ACETAMINOPHEN 5-325 MG PO TABS
1.0000 | ORAL_TABLET | Freq: Once | ORAL | Status: AC
  Administered 2017-02-18: 1 via ORAL
  Filled 2017-02-18: qty 1

## 2017-02-18 MED ORDER — OXYCODONE-ACETAMINOPHEN 5-325 MG PO TABS: 1.0000 | ORAL_TABLET | Freq: Four times a day (QID) | ORAL | 0 refills | Status: DC | PRN

## 2017-02-18 NOTE — ED Notes (Signed)
Patient returned to room. 

## 2017-02-18 NOTE — Discharge Instructions (Signed)
Wear the sling to stabilize your fracture. Use ice packs over your shoulder for comfort and to keep the swelling down. Take the percocet for pain. Call Dr Mort Sawyers office to get an appointment to have him recheck your fracture.  Return to the ED if your fingers get painful or numb or get cold and pale.

## 2017-02-18 NOTE — ED Notes (Signed)
Pt made aware to return if symptoms worsen or if any life threatening symptoms occur.   

## 2017-02-18 NOTE — ED Notes (Signed)
ED Provider at bedside. 

## 2017-02-18 NOTE — ED Triage Notes (Signed)
Patient slipped and fell in the bathtub around 3 hours ago.  Did not get dizzy.  There was a lot of soap in the bathtub and I slipped and fell.  Did not hid her head, did not lose consciousness.  Having pain in her right lower arm below her elbow.  She was taking a shower, did not sit in cold water.  Abrasions noted to bi-lateral knees.  Scattered bruising noted to right arm.

## 2017-02-18 NOTE — ED Provider Notes (Signed)
[facility]Y DEPT Provider Note   CSN: [id number] Arrival date & time: 02/18/17  0219  Time sen 02:40 AM   History   Chief Complaint Chief Complaint  Patient presents with  . Fall    HPI Nancy Baldwin is a 71 y.o. female.  HPI patient reports she was taking a shower and the floor of the bathtub was slippery from soap. She states when she turned off the water she fell in tub. She really can't tell me how she fell, whether she fell backwards forwards all she knows that she ended up in the tub. She denies hitting her arm on anything. She states however she heard a significant pop and she complains of pain in her forearm. She denies hitting her head or having loss of consciousness. She denies any numbness in her fingers. Patient states she is right-handed.  PCP Ignatius Specking, MD   Past Medical History:  Diagnosis Date  . Anxiety   . CHF (congestive heart failure) (HCC)   . Depression   . Hypertension   . Lung cancer [facility])     Patient Active Problem List   Diagnosis Date Noted  . Diastolic CHF (HCC) 02/07/2014  . Pneumonia 02/07/2014  . Cirrhosis (HCC) 02/07/2014  . Unspecified hypothyroidism 02/07/2014  . Anxiety state, unspecified 02/07/2014  . Depression 02/07/2014  . TIA (transient ischemic attack) 02/07/2014    Past Surgical History:  Procedure Laterality Date  . ABDOMINAL HYSTERECTOMY    . TONSILLECTOMY AND ADENOIDECTOMY      OB History    No data available       Home Medications    Prior to Admission medications   Medication Sig Start Date End Date Taking? Authorizing Provider  acetaminophen (TYLENOL) 325 MG tablet Take 650 mg by mouth every 4 (four) hours as needed.   Yes Historical Provider, MD  ALPRAZolam (XANAX) 0.25 MG tablet Take 1 tablet by mouth 3 times daily as needed for Anxiety. 02/04/14  Yes Arlo C Trudie Reed, PA-C  aspirin 81 MG tablet Take 81 mg by mouth daily.   Yes Historical Provider, MD  furosemide (LASIX) 20 MG tablet Take 60 mg by mouth  2 (two) times daily.    Yes Historical Provider, MD  insulin glargine (LANTUS) 100 unit/mL SOPN Inject 30 Units into the skin daily before breakfast.   Yes Historical Provider, MD  levothyroxine (SYNTHROID, LEVOTHROID) 112 MCG tablet Take 112 mcg by mouth daily before breakfast.   Yes Historical Provider, MD  levothyroxine (SYNTHROID, LEVOTHROID) 88 MCG tablet Take 88 mcg by mouth daily before breakfast.   Yes Historical Provider, MD  potassium chloride (K-DUR) 10 MEQ tablet Take 10 mEq by mouth daily.   Yes Historical Provider, MD  propranolol (INDERAL) 10 MG tablet Take 10 mg by mouth 2 (two) times daily.   Yes Historical Provider, MD  propranolol ER (INDERAL LA) 60 MG 24 hr capsule Take 60 mg by mouth daily.   Yes Historical Provider, MD  spironolactone (ALDACTONE) 25 MG tablet Take 12.5 mg by mouth 2 (two) times daily.   Yes Historical Provider, MD  venlafaxine XR (EFFEXOR-XR) 75 MG 24 hr capsule Take 75 mg by mouth 2 (two) times daily.   Yes Historical Provider, MD  budesonide-formoterol (SYMBICORT) 160-4.5 MCG/ACT inhaler Inhale 2 puffs into the lungs 2 (two) times daily.    Historical Provider, MD  ipratropium-albuterol (DUONEB) 0.5-2.5 (3) MG/3ML SOLN Take 3 mLs by nebulization every 6 (six) hours as needed.    Historical Provider,  MD  oxyCODONE-acetaminophen (PERCOCET/ROXICET) 5-325 MG tablet Take 1 tablet by mouth every 6 (six) hours as needed for severe pain. 02/18/17   Devoria Albe, MD    Family History Family History  Problem Relation Age of Onset  . Lung cancer Mother   . Heart disease Father   . Kidney disease Father   . Heart attack Father     Social History Social History  Substance Use Topics  . Smoking status: Former Games developer  . Smokeless tobacco: Never Used  . Alcohol use No  lives at home Lives with spouse   Allergies   Amoxicillin; Codeine; Decongest-aid [pseudoephedrine]; Decongestant [oxymetazoline]; Metformin and related; and Tricor [fenofibrate]   Review of  Systems Review of Systems  All other systems reviewed and are negative.    Physical Exam Updated Vital Signs BP 112/77 (BP Location: Left Arm)   Pulse (!) 58   Temp 99 F (37.2 C) (Oral)   Resp 16   Ht  (1.702 m)   Wt 207 lb (93.9 kg)   SpO2 99%   BMI 32.42 kg/m   Vital signs normal except bradycardia   Physical Exam  Constitutional: She is oriented to person, place, and time. She appears well-developed and well-nourished.  Non-toxic appearance. She does not appear ill. No distress.  obese  HENT:  Head: Normocephalic and atraumatic.  Right Ear: External ear normal.  Left Ear: External ear normal.  Nose: Nose normal. No mucosal edema or rhinorrhea.  Mouth/Throat: Oropharynx is clear and moist and mucous membranes are normal. No dental abscesses or uvula swelling.  Eyes: Conjunctivae and EOM are normal. Pupils are equal, round, and reactive to light.  Neck: Normal range of motion and full passive range of motion without pain. Neck supple.  Cardiovascular: Normal rate, regular rhythm and normal heart sounds.  Exam reveals no gallop and no friction rub.   No murmur heard. Pulmonary/Chest: Effort normal and breath sounds normal. No respiratory distress. She has no wheezes. She has no rhonchi. She has no rales. She exhibits no tenderness and no crepitus.  Abdominal: Soft. Normal appearance and bowel sounds are normal. She exhibits no distension. There is no tenderness. There is no rebound and no guarding.  Musculoskeletal: Normal range of motion. She exhibits tenderness.  Moves all extremities well except her LUE. Patient points to her elbow and proximal forearm as to where her pain is located. When she dorsiflexes her hand it causes pain in her forearm. She is unable to supinate her hand because of pain in her elbow. She has good distal pulses and capillary refill in her fingers.  She is also noted to have bilateral knee pain without effusion. She has some red superficial  epidermal abrasions on both knees. She thinks those are from a prior injury not from tonight.  Neurological: She is alert and oriented to person, place, and time. She has normal strength. No cranial nerve deficit.  Skin: Skin is warm, dry and intact. No rash noted. No erythema. No pallor.  Psychiatric: She has a normal mood and affect. Her speech is normal and behavior is normal. Her mood appears not anxious.  Nursing note and vitals reviewed.    ED Treatments / Results  Labs (all labs ordered are listed, but only abnormal results are displayed) Labs Reviewed - No data to display  EKG  EKG Interpretation None       Radiology Dg Elbow Complete Right  Result Date: 02/18/2017 CLINICAL DATA:  Status post fall, with diffuse  right elbow pain. Initial encounter. EXAM: RIGHT ELBOW - COMPLETE 3+ VIEW COMPARISON:  None. FINDINGS: There is no evidence of fracture or dislocation. The visualized joint spaces are preserved. No significant joint effusion is identified. The soft tissues are unremarkable in appearance. IMPRESSION: No evidence of fracture or dislocation. Electronically Signed   By: Roanna Raider M.D.   On: 02/18/2017 04:53   Dg Forearm Right  Result Date: 02/18/2017 CLINICAL DATA:  Status post fall, with right forearm pain. Initial encounter. EXAM: RIGHT FOREARM - 2 VIEW COMPARISON:  None. FINDINGS: There is no evidence of fracture or dislocation. The radius and ulna appear grossly intact. The elbow joint is incompletely assessed, but appears grossly unremarkable. No elbow joint effusion is seen. Negative ulnar variance is noted. The carpal rows appear grossly intact, and demonstrate normal alignment. No definite soft abnormalities are characterized on radiograph. IMPRESSION: No evidence of fracture or dislocation. Electronically Signed   By: Roanna Raider M.D.   On: 02/18/2017 03:34   Dg Knee Complete 4 Views Left  Result Date: 02/18/2017 CLINICAL DATA:  Status post fall, with left  knee pain. Initial encounter. EXAM: LEFT KNEE - COMPLETE 4+ VIEW COMPARISON:  None. FINDINGS: There is no evidence of fracture or dislocation. The joint spaces are preserved. No significant degenerative change is seen; the patellofemoral joint is grossly unremarkable in appearance. No significant joint effusion is seen. The visualized soft tissues are normal in appearance. IMPRESSION: No evidence of fracture or dislocation. Electronically Signed   By: Roanna Raider M.D.   On: 02/18/2017 03:34   Dg Knee Complete 4 Views Right  Result Date: 02/18/2017 CLINICAL DATA:  Status post fall, with right knee pain. Initial encounter. EXAM: RIGHT KNEE - COMPLETE 4+ VIEW COMPARISON:  None. FINDINGS: There is no evidence of fracture or dislocation. The joint spaces are preserved. No significant degenerative change is seen; the patellofemoral joint is grossly unremarkable in appearance. No significant joint effusion is seen. The visualized soft tissues are normal in appearance. IMPRESSION: No evidence of fracture or dislocation. Electronically Signed   By: Roanna Raider M.D.   On: 02/18/2017 03:34   Dg Humerus Right  Result Date: 02/18/2017 CLINICAL DATA:  Fall in the bathtub EXAM: RIGHT HUMERUS - 2+ VIEW COMPARISON:  Right elbow radiograph 02/18/2017 FINDINGS: There is a comminuted, moderately displaced and angulated fracture of the proximal right humerus. The distal right humerus is unremarkable. IMPRESSION: Comminuted, moderately displaced and angulated fracture of the humeral head. Electronically Signed   By: Deatra Robinson M.D.   On: 02/18/2017 06:21    Procedures Procedures (including critical care time)  Medications Ordered in ED Medications  oxyCODONE-acetaminophen (PERCOCET/ROXICET) 5-325 MG per tablet 1 tablet (1 tablet Oral Given 02/18/17 1610)     Initial Impression / Assessment and Plan / ED Course  I have reviewed the triage vital signs and the nursing notes.  Pertinent labs & imaging results  that were available during my care of the patient were reviewed by me and considered in my medical decision making (see chart for details).  X-rays were ordered of her right forearm which she states is where her pain is located.  After her first set of x-rays returned patient still was having a lot of pain. I read exam in terms she again said it was painful in her elbow. Since the elbow was not well visualized on the other x-rays an elbow x-ray was ordered.  After reviewing her elbow x-rays went back and examined patient again.  She again points to her elbow area in her proximal forearm as to where her pain is located. However when I palpate up above her arm she seems painful there also. Humerus x-ray was ordered.  After reviewing her humerus x-ray patient was placed in a shoulder immobilizer and given Percocet for pain. She will be given orthopedic referral.  Review of the [place] shows no injuries for the past 6 months.  Final Clinical Impressions(s) / ED Diagnoses   Final diagnoses:  Fall in home, initial encounter  Closed fracture of head of right humerus, initial encounter  Contusion of knee, unspecified laterality, initial encounter    New Prescriptions New Prescriptions   OXYCODONE-ACETAMINOPHEN (PERCOCET/ROXICET) 5-325 MG TABLET    Take 1 tablet by mouth every 6 (six) hours as needed for severe pain.    Plan discharge  Devoria Albe, MD, Concha Pyo, MD 02/18/17 [phone]

## 2017-02-18 NOTE — ED Notes (Signed)
Po fluids given to patient at this time.

## 2017-06-12 ENCOUNTER — Encounter (HOSPITAL_COMMUNITY): Payer: Self-pay | Admitting: Emergency Medicine

## 2017-06-12 ENCOUNTER — Inpatient Hospital Stay (HOSPITAL_COMMUNITY)
Admission: EM | Admit: 2017-06-12 | Discharge: 2017-06-19 | DRG: 542 | Disposition: A | Discharge status: Skilled Nursing Facility | Payer: Medicare Other | Attending: Internal Medicine | Admitting: Internal Medicine

## 2017-06-12 ENCOUNTER — Emergency Department (HOSPITAL_COMMUNITY): Payer: Medicare Other

## 2017-06-12 DIAGNOSIS — E1169 Type 2 diabetes mellitus with other specified complication: Secondary | ICD-10-CM | POA: Diagnosis present

## 2017-06-12 DIAGNOSIS — E119 Type 2 diabetes mellitus without complications: Secondary | ICD-10-CM | POA: Diagnosis present

## 2017-06-12 DIAGNOSIS — Z801 Family history of malignant neoplasm of trachea, bronchus and lung: Secondary | ICD-10-CM

## 2017-06-12 DIAGNOSIS — F419 Anxiety disorder, unspecified: Secondary | ICD-10-CM | POA: Diagnosis present

## 2017-06-12 DIAGNOSIS — Z888 Allergy status to other drugs, medicaments and biological substances status: Secondary | ICD-10-CM

## 2017-06-12 DIAGNOSIS — D696 Thrombocytopenia, unspecified: Secondary | ICD-10-CM | POA: Diagnosis present

## 2017-06-12 DIAGNOSIS — I5033 Acute on chronic diastolic (congestive) heart failure: Secondary | ICD-10-CM | POA: Diagnosis present

## 2017-06-12 DIAGNOSIS — M4855XA Collapsed vertebra, not elsewhere classified, thoracolumbar region, initial encounter for fracture: Principal | ICD-10-CM | POA: Diagnosis present

## 2017-06-12 DIAGNOSIS — R296 Repeated falls: Secondary | ICD-10-CM | POA: Diagnosis present

## 2017-06-12 DIAGNOSIS — Y92012 Bathroom of single-family (private) house as the place of occurrence of the external cause: Secondary | ICD-10-CM

## 2017-06-12 DIAGNOSIS — Z87891 Personal history of nicotine dependence: Secondary | ICD-10-CM

## 2017-06-12 DIAGNOSIS — I5030 Unspecified diastolic (congestive) heart failure: Secondary | ICD-10-CM | POA: Diagnosis present

## 2017-06-12 DIAGNOSIS — Z6841 Body Mass Index (BMI) 40.0 and over, adult: Secondary | ICD-10-CM

## 2017-06-12 DIAGNOSIS — K7682 Hepatic encephalopathy: Secondary | ICD-10-CM | POA: Diagnosis present

## 2017-06-12 DIAGNOSIS — Z880 Allergy status to penicillin: Secondary | ICD-10-CM

## 2017-06-12 DIAGNOSIS — S2242XK Multiple fractures of ribs, left side, subsequent encounter for fracture with nonunion: Secondary | ICD-10-CM | POA: Diagnosis present

## 2017-06-12 DIAGNOSIS — R52 Pain, unspecified: Secondary | ICD-10-CM | POA: Diagnosis present

## 2017-06-12 DIAGNOSIS — E039 Hypothyroidism, unspecified: Secondary | ICD-10-CM | POA: Diagnosis present

## 2017-06-12 DIAGNOSIS — M4856XA Collapsed vertebra, not elsewhere classified, lumbar region, initial encounter for fracture: Secondary | ICD-10-CM | POA: Diagnosis present

## 2017-06-12 DIAGNOSIS — Z85118 Personal history of other malignant neoplasm of bronchus and lung: Secondary | ICD-10-CM

## 2017-06-12 DIAGNOSIS — S2242XA Multiple fractures of ribs, left side, initial encounter for closed fracture: Secondary | ICD-10-CM | POA: Diagnosis present

## 2017-06-12 DIAGNOSIS — K7200 Acute and subacute hepatic failure without coma: Secondary | ICD-10-CM | POA: Diagnosis present

## 2017-06-12 DIAGNOSIS — K7460 Unspecified cirrhosis of liver: Secondary | ICD-10-CM | POA: Diagnosis present

## 2017-06-12 DIAGNOSIS — Z90710 Acquired absence of both cervix and uterus: Secondary | ICD-10-CM

## 2017-06-12 DIAGNOSIS — S22080A Wedge compression fracture of T11-T12 vertebra, initial encounter for closed fracture: Secondary | ICD-10-CM | POA: Diagnosis present

## 2017-06-12 DIAGNOSIS — Z885 Allergy status to narcotic agent status: Secondary | ICD-10-CM

## 2017-06-12 DIAGNOSIS — I110 Hypertensive heart disease with heart failure: Secondary | ICD-10-CM | POA: Diagnosis present

## 2017-06-12 DIAGNOSIS — W19XXXA Unspecified fall, initial encounter: Secondary | ICD-10-CM

## 2017-06-12 DIAGNOSIS — F329 Major depressive disorder, single episode, unspecified: Secondary | ICD-10-CM | POA: Diagnosis present

## 2017-06-12 DIAGNOSIS — I5031 Acute diastolic (congestive) heart failure: Secondary | ICD-10-CM | POA: Diagnosis present

## 2017-06-12 DIAGNOSIS — M4850XA Collapsed vertebra, not elsewhere classified, site unspecified, initial encounter for fracture: Secondary | ICD-10-CM

## 2017-06-12 DIAGNOSIS — S42201A Unspecified fracture of upper end of right humerus, initial encounter for closed fracture: Secondary | ICD-10-CM | POA: Diagnosis present

## 2017-06-12 DIAGNOSIS — E860 Dehydration: Secondary | ICD-10-CM | POA: Diagnosis present

## 2017-06-12 DIAGNOSIS — Z8249 Family history of ischemic heart disease and other diseases of the circulatory system: Secondary | ICD-10-CM

## 2017-06-12 DIAGNOSIS — N179 Acute kidney failure, unspecified: Secondary | ICD-10-CM | POA: Diagnosis present

## 2017-06-12 DIAGNOSIS — IMO0001 Reserved for inherently not codable concepts without codable children: Secondary | ICD-10-CM

## 2017-06-12 DIAGNOSIS — J9811 Atelectasis: Secondary | ICD-10-CM | POA: Diagnosis present

## 2017-06-12 DIAGNOSIS — Z794 Long term (current) use of insulin: Secondary | ICD-10-CM

## 2017-06-12 DIAGNOSIS — Z7982 Long term (current) use of aspirin: Secondary | ICD-10-CM

## 2017-06-12 DIAGNOSIS — Z79899 Other long term (current) drug therapy: Secondary | ICD-10-CM

## 2017-06-12 DIAGNOSIS — W010XXA Fall on same level from slipping, tripping and stumbling without subsequent striking against object, initial encounter: Secondary | ICD-10-CM | POA: Diagnosis present

## 2017-06-12 LAB — CBC WITH DIFFERENTIAL/PLATELET
Basophils Absolute: 0 10*3/uL (ref 0.0–0.1)
Basophils Relative: 0 %
Eosinophils Absolute: 0.2 10*3/uL (ref 0.0–0.7)
Eosinophils Relative: 2 %
HEMATOCRIT: 42.1 % (ref 36.0–46.0)
HEMOGLOBIN: 13.9 g/dL (ref 12.0–15.0)
LYMPHS ABS: 1.3 10*3/uL (ref 0.7–4.0)
LYMPHS PCT: 11 %
MCH: 33.1 pg (ref 26.0–34.0)
MCHC: 33 g/dL (ref 30.0–36.0)
MCV: 100.2 fL — AB (ref 78.0–100.0)
MONO ABS: 1.1 10*3/uL — AB (ref 0.1–1.0)
Monocytes Relative: 10 %
NEUTROS ABS: 8.4 10*3/uL — AB (ref 1.7–7.7)
NEUTROS PCT: 77 %
Platelets: 126 10*3/uL — ABNORMAL LOW (ref 150–400)
RBC: 4.2 MIL/uL (ref 3.87–5.11)
RDW: 15 % (ref 11.5–15.5)
WBC: 11 10*3/uL — ABNORMAL HIGH (ref 4.0–10.5)

## 2017-06-12 LAB — BASIC METABOLIC PANEL
Anion gap: 11 (ref 5–15)
BUN: 28 mg/dL — ABNORMAL HIGH (ref 6–20)
CHLORIDE: 101 mmol/L (ref 101–111)
CO2: 28 mmol/L (ref 22–32)
Calcium: 9.1 mg/dL (ref 8.9–10.3)
Creatinine, Ser: 1.12 mg/dL — ABNORMAL HIGH (ref 0.44–1.00)
GFR calc non Af Amer: 49 mL/min — ABNORMAL LOW (ref >60)
GFR, EST AFRICAN AMERICAN: 56 mL/min — AB (ref >60)
GLUCOSE: 93 mg/dL (ref 65–99)
Potassium: 4.4 mmol/L (ref 3.5–5.1)
Sodium: 140 mmol/L (ref 135–145)

## 2017-06-12 LAB — BRAIN NATRIURETIC PEPTIDE: B NATRIURETIC PEPTIDE 5: 539 pg/mL — AB (ref 0.0–100.0)

## 2017-06-12 LAB — GLUCOSE, CAPILLARY: Glucose-Capillary: 186 mg/dL — ABNORMAL HIGH (ref 65–99)

## 2017-06-12 LAB — TROPONIN I: Troponin I: <0.03 ng/mL (ref <0.03)

## 2017-06-12 MED ORDER — ONDANSETRON HCL 4 MG/2ML IJ SOLN
4.0000 mg | Freq: Four times a day (QID) | INTRAMUSCULAR | Status: DC | PRN
Start: 2017-06-12 — End: 2017-06-19

## 2017-06-12 MED ORDER — IOPAMIDOL (ISOVUE-300) INJECTION 61%
100.0000 mL | Freq: Once | INTRAVENOUS | Status: AC | PRN
  Administered 2017-06-12: 100 mL via INTRAVENOUS

## 2017-06-12 MED ORDER — PROPRANOLOL HCL 20 MG PO TABS
20.0000 mg | ORAL_TABLET | Freq: Two times a day (BID) | ORAL | Status: DC
  Administered 2017-06-13 – 2017-06-19 (×9): 20 mg via ORAL
  Filled 2017-06-12 (×14): qty 1

## 2017-06-12 MED ORDER — VENLAFAXINE HCL ER 75 MG PO CP24
150.0000 mg | ORAL_CAPSULE | Freq: Two times a day (BID) | ORAL | Status: DC
  Administered 2017-06-12 – 2017-06-19 (×13): 150 mg via ORAL
  Filled 2017-06-12 (×13): qty 2

## 2017-06-12 MED ORDER — FENTANYL CITRATE (PF) 100 MCG/2ML IJ SOLN
50.0000 ug | Freq: Once | INTRAMUSCULAR | Status: AC
  Administered 2017-06-12: 50 ug via INTRAVENOUS
  Filled 2017-06-12: qty 2

## 2017-06-12 MED ORDER — MORPHINE SULFATE (PF) 2 MG/ML IV SOLN: 1.0000 mg | INTRAVENOUS | Status: DC | PRN

## 2017-06-12 MED ORDER — KETOROLAC TROMETHAMINE 15 MG/ML IJ SOLN
15.0000 mg | Freq: Four times a day (QID) | INTRAMUSCULAR | Status: DC | PRN
  Administered 2017-06-12: 15 mg via INTRAVENOUS
  Filled 2017-06-12: qty 1

## 2017-06-12 MED ORDER — PANTOPRAZOLE SODIUM 40 MG PO TBEC
40.0000 mg | DELAYED_RELEASE_TABLET | Freq: Every day | ORAL | Status: DC
  Administered 2017-06-12 – 2017-06-19 (×8): 40 mg via ORAL
  Filled 2017-06-12 (×8): qty 1

## 2017-06-12 MED ORDER — DEXTROSE-NACL 5-0.9 % IV SOLN
INTRAVENOUS | Status: DC
  Administered 2017-06-12 – 2017-06-14 (×4): via INTRAVENOUS

## 2017-06-12 MED ORDER — ASPIRIN 81 MG PO CHEW
81.0000 mg | CHEWABLE_TABLET | Freq: Every day | ORAL | Status: DC
  Administered 2017-06-12 – 2017-06-19 (×8): 81 mg via ORAL
  Filled 2017-06-12 (×9): qty 1

## 2017-06-12 MED ORDER — ACETAMINOPHEN 325 MG PO TABS
650.0000 mg | ORAL_TABLET | Freq: Four times a day (QID) | ORAL | Status: DC | PRN
  Administered 2017-06-17: 650 mg via ORAL
  Filled 2017-06-12: qty 2

## 2017-06-12 MED ORDER — ENOXAPARIN SODIUM 40 MG/0.4ML ~~LOC~~ SOLN
40.0000 mg | SUBCUTANEOUS | Status: DC
  Administered 2017-06-13 – 2017-06-18 (×6): 40 mg via SUBCUTANEOUS
  Filled 2017-06-12 (×7): qty 0.4

## 2017-06-12 MED ORDER — PRAVASTATIN SODIUM 10 MG PO TABS
10.0000 mg | ORAL_TABLET | Freq: Every day | ORAL | Status: DC
  Administered 2017-06-12 – 2017-06-18 (×7): 10 mg via ORAL
  Filled 2017-06-12 (×7): qty 1

## 2017-06-12 MED ORDER — ACETAMINOPHEN 650 MG RE SUPP: 650.0000 mg | Freq: Four times a day (QID) | RECTAL | Status: DC | PRN

## 2017-06-12 MED ORDER — INSULIN ASPART 100 UNIT/ML ~~LOC~~ SOLN
0.0000 [IU] | Freq: Three times a day (TID) | SUBCUTANEOUS | Status: DC
  Administered 2017-06-13: 1 [IU] via SUBCUTANEOUS
  Administered 2017-06-13 – 2017-06-14 (×2): 2 [IU] via SUBCUTANEOUS
  Administered 2017-06-15: 3 [IU] via SUBCUTANEOUS
  Administered 2017-06-16 – 2017-06-18 (×2): 1 [IU] via SUBCUTANEOUS
  Administered 2017-06-18: 2 [IU] via SUBCUTANEOUS
  Administered 2017-06-19: 1 [IU] via SUBCUTANEOUS
  Administered 2017-06-19: 3 [IU] via SUBCUTANEOUS

## 2017-06-12 MED ORDER — ONDANSETRON HCL 4 MG PO TABS: 4.0000 mg | ORAL_TABLET | Freq: Four times a day (QID) | ORAL | Status: DC | PRN

## 2017-06-12 MED ORDER — LEVOTHYROXINE SODIUM 100 MCG PO TABS
100.0000 ug | ORAL_TABLET | Freq: Every day | ORAL | Status: DC
  Administered 2017-06-13 – 2017-06-16 (×4): 100 ug via ORAL
  Filled 2017-06-12 (×4): qty 1

## 2017-06-12 MED ORDER — ORAL CARE MOUTH RINSE
15.0000 mL | Freq: Two times a day (BID) | OROMUCOSAL | Status: DC
  Administered 2017-06-12 – 2017-06-18 (×12): 15 mL via OROMUCOSAL

## 2017-06-12 MED ORDER — IBUPROFEN 400 MG PO TABS
400.0000 mg | ORAL_TABLET | Freq: Three times a day (TID) | ORAL | Status: DC
  Administered 2017-06-12 – 2017-06-13 (×2): 400 mg via ORAL
  Filled 2017-06-12 (×2): qty 1

## 2017-06-12 NOTE — ED Provider Notes (Signed)
[facility] DE[facility] Provider Note   CSN: [id number] Arrival date & time: 06/12/17  1153     History   Chief Complaint Chief Complaint  Patient presents with  . Fall    HPI APPLE DEARMAS is a 71 y.o. female.  HPI Patient presents after fall. Trying to get to the bathroom and slipped and fell. Landed with her left axilla on the edge of the tub. Complaining of pain in that area. Denies hitting her head. States that she hurts "everywhere". 4 months ago she fell and broke her right proximal humerus and never followed up. She is right-handed. She is not on anticoagulation. Past Medical History:  Diagnosis Date  . Anxiety   . CHF (congestive heart failure) (HCC)   . Depression   . Hypertension   . Lung cancer [facility])     Patient Active Problem List   Diagnosis Date Noted  . Diastolic CHF (HCC) 02/07/2014  . Pneumonia 02/07/2014  . Cirrhosis (HCC) 02/07/2014  . Unspecified hypothyroidism 02/07/2014  . Anxiety state, unspecified 02/07/2014  . Depression 02/07/2014  . TIA (transient ischemic attack) 02/07/2014    Past Surgical History:  Procedure Laterality Date  . ABDOMINAL HYSTERECTOMY    . TONSILLECTOMY AND ADENOIDECTOMY      OB History    No data available       Home Medications    Prior to Admission medications   Medication Sig Start Date End Date Taking? Authorizing Provider  aspirin 81 MG tablet Take 81 mg by mouth daily.   Yes [provider]  furosemide (LASIX) 20 MG tablet Take 60 mg by mouth 2 (two) times daily.    Yes [provider]  HUMALOG KWIKPEN 100 UNIT/ML KiwkPen INJECT 12 UNITS SUBCUTANEOUSLY TWICE DAILY. 05/12/17  Yes [provider]  insulin glargine (LANTUS) 100 unit/mL SOPN Inject 52 Units into the skin daily before breakfast.    Yes [provider]  levothyroxine (SYNTHROID, LEVOTHROID) 100 MCG tablet  06/11/17  Yes [provider]  potassium chloride (K-DUR) 10 MEQ tablet Take 10 mEq by mouth daily.    Yes [provider]  pravastatin (PRAVACHOL) 10 MG tablet Take by mouth daily.  06/11/17  Yes [provider]  propranolol (INDERAL) 20 MG tablet 20 mg 2 (two) times daily.  06/11/17  Yes [provider]  spironolactone (ALDACTONE) 25 MG tablet Take 12.5 mg by mouth 2 (two) times daily.   Yes [provider]  SSD 1 % cream Apply 1 application topically daily.  06/11/17  Yes [provider]  venlafaxine XR (EFFEXOR-XR) 150 MG 24 hr capsule Take 150 mg by mouth 2 (two) times daily.    Yes [provider]    Family History Family History  Problem Relation Age of Onset  . Lung cancer Mother   . Heart disease Father   . Kidney disease Father   . Heart attack Father     Social History Social History  Substance Use Topics  . Smoking status: Former Games developer  . Smokeless tobacco: Never Used  . Alcohol use No     Allergies   Amoxicillin; Codeine; Decongest-aid [pseudoephedrine]; Decongestant [oxymetazoline]; Metformin and related; and Tricor [fenofibrate]   Review of Systems Review of Systems  Constitutional: Negative for appetite change.  HENT: Negative for trouble swallowing.   Eyes: Negative for visual disturbance.  Respiratory: Negative for shortness of breath.   Cardiovascular: Positive for chest pain.  Gastrointestinal: Negative for abdominal pain.  Genitourinary: Negative for dysuria  and flank pain.  Musculoskeletal: Negative for back pain and neck pain.     Physical Exam Updated Vital Signs BP 122/61   Pulse (!) 49   Temp (!) 97.5 F (36.4 C) (Oral)   Resp 20   Ht 5\' 7"  (1.702 m)   Wt 104.3 kg (230 lb)   SpO2 100%   BMI 36.02 kg/m   Physical Exam  Constitutional: She appears well-developed.  HENT:  Head: Normocephalic and atraumatic.  Eyes: Pupils are equal, round, and reactive to light.  Neck: Neck supple.  Mild lower cervical spine tenderness.  Cardiovascular: Normal rate.   Pulmonary/Chest: She exhibits  tenderness.  Moderate tenderness to left anterior upper chest wall and left axilla area. Equal breath sounds bilaterally  Abdominal: There is no tenderness.  Musculoskeletal: She exhibits edema and tenderness.  Tenderness and report with chronic deformity and right upper shoulder. Decreased range of motion in this area. Neurovascular intact in hand bilaterally. Decreased range of motion left shoulder due to pain in axillary area. Pulse intact in left wrist. Tenderness to left axillary without crepitance deformity or subcutaneous emphysema.  Skin: Skin is warm. Capillary refill takes less than 2 seconds.  Psychiatric: She has a normal mood and affect.     ED Treatments / Results  Labs (all labs ordered are listed, but only abnormal results are displayed) Labs Reviewed  BASIC METABOLIC PANEL - Abnormal; Notable for the following:       Result Value   BUN 28 (*)    Creatinine, Ser 1.12 (*)    GFR calc non Af Amer 49 (*)    GFR calc Af Amer 56 (*)    All other components within normal limits  CBC WITH DIFFERENTIAL/PLATELET - Abnormal; Notable for the following:    WBC 11.0 (*)    MCV 100.2 (*)    Platelets 126 (*)    Neutro Abs 8.4 (*)    Monocytes Absolute 1.1 (*)    All other components within normal limits  BRAIN NATRIURETIC PEPTIDE - Abnormal; Notable for the following:    B Natriuretic Peptide 539.0 (*)    All other components within normal limits  TROPONIN I    EKG  EKG Interpretation None       Radiology Ct Head Wo Contrast  Result Date: 06/12/2017 CLINICAL DATA:  Larey Seat in the bathroom. EXAM: CT HEAD WITHOUT CONTRAST TECHNIQUE: Contiguous axial images were obtained from the base of the skull through the vertex without intravenous contrast. COMPARISON:  None. FINDINGS: Brain: Mild generalized atrophy. Mild chronic small-vessel change of the hemispheric white matter. No sign of acute infarction, mass lesion, hemorrhage, hydrocephalus or extra-axial collection. Vascular:  There is atherosclerotic calcification of the major vessels at the base of the brain. Skull: No skull fracture. Sinuses/Orbits: Clear/normal Other: None significant IMPRESSION: No acute or traumatic finding. Atrophy and chronic small-vessel ischemic changes. Electronically Signed   By: Paulina Fusi M.D.   On: 06/12/2017 14:20   Ct Chest W Contrast  Result Date: 06/12/2017 CLINICAL DATA:  Larey Seat in the bathroom. Trauma to the left chest with pain. EXAM: CT CHEST WITH CONTRAST TECHNIQUE: Multidetector CT imaging of the chest was performed during intravenous contrast administration. CONTRAST:  ISOVUE-300 IOPAMIDOL (ISOVUE-300) INJECTION 61% COMPARISON:  CT abdomen same day FINDINGS: Cardiovascular: Aortic atherosclerosis. No aneurysm or dissection. Coronary artery calcification. Heart size upper limits of normal. No pericardial fluid. Multiple esophageal and paraesophageal varices. Mediastinum/Nodes: No mass or lymphadenopathy. Lungs/Pleura: Pulmonary atelectasis and/or scarring in both  lungs, most pronounced in the lower lobes left more than right. Small pleural effusions. No pneumothorax. Upper Abdomen: See results of abdominal scan.  Cirrhosis. Musculoskeletal: Comminuted displaced left posterior seventh rib fracture. Displaced left posterior eighth rib fracture. Displaced posterior left ninth rib fracture. Nondisplaced left posterior tenth rib fracture. No right rib fractures are seen. Massively comminuted fracture of the right proximal humerus. This could be an acute on chronic injury. Old augmented thoracic compression fractures at T7 and T8. Old appearing superior endplate compression fracture at T11. This appears healed. Acute fractures at T12 and L1 as described on the abdominal study. IMPRESSION: Acute left posterior rib fractures affecting the seventh, eighth, ninth and tenth ribs. Seventh, eighth and ninth ribs are displaced. No pneumothorax. Dependent pulmonary atelectasis left more than right.  Acute T12 and L1 fractures as described on the abdominal CT. Probably old superior endplate fracture at T11. Old augmented fractures of T7 and T8. Electronically Signed   By: Paulina FusiMark  Shogry M.D.   On: 06/12/2017 14:27   Ct Cervical Spine Wo Contrast  Result Date: 06/12/2017 CLINICAL DATA:  Larey SeatFell in the bath room. EXAM: CT CERVICAL SPINE WITHOUT CONTRAST TECHNIQUE: Multidetector CT imaging of the cervical spine was performed without intravenous contrast. Multiplanar CT image reconstructions were also generated. COMPARISON:  None. FINDINGS: Alignment: Normal Skull base and vertebrae: No cervical fracture. Soft tissues and spinal canal: Negative Disc levels: Degenerative spondylosis C5-6 and C6-7 with osteophytic encroachment upon the canal and foramina. IMPRESSION: No traumatic cervical spine finding.  Mid cervical spondylosis. Electronically Signed   By: Paulina FusiMark  Shogry M.D.   On: 06/12/2017 14:28   Ct Abdomen Pelvis W Contrast  Result Date: 06/12/2017 CLINICAL DATA:  Larey SeatFell in the bathroom.  Left-sided chest pain. EXAM: CT ABDOMEN AND PELVIS WITH CONTRAST TECHNIQUE: Multidetector CT imaging of the abdomen and pelvis was performed using the standard protocol following bolus administration of intravenous contrast. CONTRAST:  100mL ISOVUE-300 IOPAMIDOL (ISOVUE-300) INJECTION 61% COMPARISON:  None. FINDINGS: Lower chest: Atelectasis at both lung bases. See results of chest CT. Hepatobiliary: Cirrhosis pattern of the liver. No evidence of focal liver lesion or liver injury. No calcified stones. Pancreas: Normal Spleen: Mild splenomegaly. 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 Multiple splenic varices. Adrenals/Urinary Tract: Adrenal glands are normal. Multiple retroperitoneal varices on the left. No renal lesion. Stomach/Bowel: No acute bowel finding. Vascular/Lymphatic: Aortic atherosclerosis. No aneurysm. IVC is normal. No retroperitoneal adenopathy. Reproductive: Previous hysterectomy. No pelvic  mass. Tiny amount of free fluid in the pelvis. Other: No free air. Musculoskeletal: Spinal curvature. Acute superior endplate fracture at T12 with loss of height of 30%. Acute compression fracture at L1 with loss of height of 80%. No retropulsed bone. See results of chest CT for thoracic spinal findings. IMPRESSION: No acute intraabdominal finding. Cirrhosis and portal venous hypertension. Splenomegaly with extensive varices. Acute superior endplate compression fracture at T12 with loss of height of 30%. Acute compression fracture at L1 with loss of height of 80%. Electronically Signed   By: Paulina FusiMark  Shogry M.D.   On: 06/12/2017 14:19   Dg Chest Portable 1 View  Result Date: 06/12/2017 CLINICAL DATA:  Left-sided chest pain status post fall between the bathtub in the commode. History of lung malignancy, CHF, cirrhosis, and former smoker. EXAM: PORTABLE CHEST 1 VIEW COMPARISON:  Report of a chest x-ray dated February 03, 2014 FINDINGS: The lungs are borderline hypoinflated. There is increased density at the left lung base with partial obscuration of the  hemidiaphragm. There is no pneumothorax. No large pleural effusion is observed. The cardiac silhouette is enlarged. The central pulmonary vascularity is engorged with mild cephalization. There is calcification in the wall of the aortic arch. The observed bony thorax is unremarkable. There is an old fracture of the proximal left humerus with displacement of the humeral head. IMPRESSION: CHF with mild pulmonary interstitial edema. I cannot exclude bibasilar atelectasis greatest on the left. There is no pneumothorax or pleural effusion. Given these findings and the patient's symptoms, chest CT scanning would be a useful next imaging step. Electronically Signed   By: David  SwazilandJordan M.D.   On: 06/12/2017 12:49    Procedures Procedures (including critical care time)  Medications Ordered in ED Medications  fentaNYL (SUBLIMAZE) injection 50 mcg (50 mcg Intravenous Given  06/12/17 1256)  iopamidol (ISOVUE-300) 61 % injection 100 mL (100 mLs Intravenous Contrast Given 06/12/17 1355)     Initial Impression / Assessment and Plan / ED Course  I have reviewed the triage vital signs and the nursing notes.  Pertinent labs & imaging results that were available during my care of the patient were reviewed by me and considered in my medical decision making (see chart for details).     Patient with fall. Has several chronic illnesses. Has 4 left-sided rib fractures. Possible acute on chronic right shoulder fracture and 2 new compression fractures. With comorbidities and age of things first and would benefit from admission. Admit to internal medicine.  Final Clinical Impressions(s) / ED Diagnoses   Final diagnoses:  Fall, initial encounter  Closed fracture of multiple ribs of left side, initial encounter  Spinal compression fracture, initial encounter [facility](HCC)    New Prescriptions New Prescriptions   No medications on file     Benjiman CorePickering, Kalani Baray, MD 06/12/17 1511

## 2017-06-12 NOTE — H&P (Signed)
History and Physical    SAHIAN KERNEY Baldwin:[id number] DOB: January 06, 1946 DOA: 06/12/2017  PCP: Ignatius Specking, MD    Patient coming from: Home     Chief Complaint: Left chest wall pain.  HPI: Nancy Baldwin is a 71 y.o. female with medical history significant of severe right humeral fracture in April 2018 with a consequent ambulatory dysfunction. Patient complains of multiple mechanical falls, the last event happened today when she slipped on the bathroom floor, landing between the bathtub and the commode, hitting her left shoulder, no head trauma or loss of consciousness. After the fall patient experienced excruciating pain on her left chest wall, worse with deep inspiration or movement, no radiation, no improving factors, no other associated symptoms. Patient was unable to stand back on her feet, EMS was called and patient was brought into the hospital.  She has had no follow-up for her left humeral fracture, she has been using a sling intermittently.   ED Course: Patient was diagnosed with multiple re-fractures on the left, new compression fractures, intractable pain, not able to ambulate, referred for admission.  Review of Systems:  1. General. No fevers no chills 2. ENT no drysore throat 3. Pulmonary no shortness of breath, cough or hemoptysis 4. Cardiovascular, neurological claudication 5. Musculoskeletal left shoulder pain 6. Gastrointestinal no nausea vomiting or diarrhea 7. Hematology no easy bruisability or frequent infections 8. Neurology no seizures or paresthesias 9. Endocrine no heat or cold intolerance 10. Urology no dysuria or increased urinary frequency  Past Medical History:  Diagnosis Date  . Anxiety   . CHF (congestive heart failure) (HCC)   . Depression   . Hypertension   . Lung cancer [facility])     Past Surgical History:  Procedure Laterality Date  . ABDOMINAL HYSTERECTOMY    . TONSILLECTOMY AND ADENOIDECTOMY       reports that she has quit smoking. She has  never used smokeless tobacco. She reports that she does not drink alcohol or use drugs.  Allergies  Allergen Reactions  . Amoxicillin Other (See Comments)    Unknown  . Codeine Other (See Comments)    Unknown  . Decongest-Aid [Pseudoephedrine] Other (See Comments)    Unknown  . Decongestant [Oxymetazoline] Other (See Comments)    Unknown  . Metformin And Related Other (See Comments)    Unkown  . Tricor [Fenofibrate] Other (See Comments)    Unknown    Family History  Problem Relation Age of Onset  . Lung cancer Mother   . Heart disease Father   . Kidney disease Father   . Heart attack Father      Prior to Admission medications   Medication Sig Start Date End Date Taking? Authorizing Provider  aspirin 81 MG tablet Take 81 mg by mouth daily.   Yes [provider]  furosemide (LASIX) 20 MG tablet Take 60 mg by mouth 2 (two) times daily.    Yes [provider]  HUMALOG KWIKPEN 100 UNIT/ML KiwkPen INJECT 12 UNITS SUBCUTANEOUSLY TWICE DAILY. 05/12/17  Yes [provider]  insulin glargine (LANTUS) 100 unit/mL SOPN Inject 52 Units into the skin daily before breakfast.    Yes [provider]  levothyroxine (SYNTHROID, LEVOTHROID) 100 MCG tablet  06/11/17  Yes [provider]  potassium chloride (K-DUR) 10 MEQ tablet Take 10 mEq by mouth daily.   Yes [provider]  pravastatin (PRAVACHOL) 10 MG tablet Take by mouth daily.  06/11/17  Yes [provider]  propranolol (INDERAL)  20 MG tablet 20 mg 2 (two) times daily.  06/11/17  Yes [provider]  spironolactone (ALDACTONE) 25 MG tablet Take 12.5 mg by mouth 2 (two) times daily.   Yes [provider]  SSD 1 % cream Apply 1 application topically daily.  06/11/17  Yes [provider]  venlafaxine XR (EFFEXOR-XR) 150 MG 24 hr capsule Take 150 mg by mouth 2 (two) times daily.    Yes [provider]    Physical Exam: Vitals:   06/12/17 1157 06/12/17  1200 06/12/17 1300  BP:  126/62 122/61  Pulse:  92 (!) 49  Resp:  20 20  Temp:  (!) 97.5 F (36.4 C)   TempSrc:  Oral   SpO2:  100% 100%  Weight: 104.3 kg (230 lb)    Height: 5\' 7"  (1.702 m)      Constitutional: Deconditioning Vitals:   06/12/17 1157 06/12/17 1200 06/12/17 1300  BP:  126/62 122/61  Pulse:  92 (!) 49  Resp:  20 20  Temp:  (!) 97.5 F (36.4 C)   TempSrc:  Oral   SpO2:  100% 100%  Weight: 104.3 kg (230 lb)    Height: 5\' 7"  (1.702 m)     Eyes: PERRL, lids and conjunctivae pale. Head normocephalic, nose and ears with no deformities.  ENMT: Mucous membranes are dry. Posterior pharynx clear of any exudate or lesions.Normal dentition.  Neck: normal, supple, no masses, no thyromegaly Respiratory: decreased inspiratory effort, no wheezing, no crackles. Normal respiratory effort. No accessory muscle use. Decreased breath sounds at bases.  Cardiovascular: Regular rate and rhythm, no murmurs / rubs / gallops. No extremity edema. 2+ pedal pulses. No carotid bruits.  Abdomen: no tenderness, no masses palpated. No hepatosplenomegaly. Bowel sounds positive.  Musculoskeletal: no clubbing / cyanosis. Positive deformity left shoulder. Good ROM, no contractures. Normal muscle tone.  Skin: Large ecchymosis on the right proximal internal thigh, ulcerated lesion on the inferior aspect of first toe left foot.  Neurologic: CN 2-12 grossly intact. Sensation intact, DTR normal. Strength 5/5 in all 4.     Labs on Admission: I have personally reviewed following labs and imaging studies  CBC:  Recent Labs Lab 06/12/17 1244  WBC 11.0*  NEUTROABS 8.4*  HGB 13.9  HCT 42.1  MCV 100.2*  PLT 126*   Basic Metabolic Panel:  Recent Labs Lab 06/12/17 1244  NA 140  K 4.4  CL 101  CO2 28  GLUCOSE 93  BUN 28*  CREATININE 1.12*  CALCIUM 9.1   GFR: Estimated Creatinine Clearance: 58.1 mL/min (A) (by C-G formula based on SCr of 1.12 mg/dL (H)). Liver Function Tests: No results  for input(s): AST, ALT, ALKPHOS, BILITOT, PROT, ALBUMIN in the last 168 hours. No results for input(s): LIPASE, AMYLASE in the last 168 hours. No results for input(s): AMMONIA in the last 168 hours. Coagulation Profile: No results for input(s): INR, PROTIME in the last 168 hours. Cardiac Enzymes:  Recent Labs Lab 06/12/17 1244  TROPONINI <0.03   BNP (last 3 results) No results for input(s): PROBNP in the last 8760 hours. HbA1C: No results for input(s): HGBA1C in the last 72 hours. CBG: No results for input(s): GLUCAP in the last 168 hours. Lipid Profile: No results for input(s): CHOL, HDL, LDLCALC, TRIG, CHOLHDL, LDLDIRECT in the last 72 hours. Thyroid Function Tests: No results for input(s): TSH, T4TOTAL, FREET4, T3FREE, THYROIDAB in the last 72 hours. Anemia Panel: No results for input(s): VITAMINB12, FOLATE, FERRITIN, TIBC, IRON, RETICCTPCT in  the last 72 hours. Urine analysis: No results found for: COLORURINE, APPEARANCEUR, LABSPEC, PHURINE, GLUCOSEU, HGBUR, BILIRUBINUR, KETONESUR, PROTEINUR, UROBILINOGEN, NITRITE, LEUKOCYTESUR  Radiological Exams on Admission: Ct Head Wo Contrast  Result Date: 06/12/2017 CLINICAL DATA:  Larey SeatFell in the bathroom. EXAM: CT HEAD WITHOUT CONTRAST TECHNIQUE: Contiguous axial images were obtained from the base of the skull through the vertex without intravenous contrast. COMPARISON:  None. FINDINGS: Brain: Mild generalized atrophy. Mild chronic small-vessel change of the hemispheric white matter. No sign of acute infarction, mass lesion, hemorrhage, hydrocephalus or extra-axial collection. Vascular: There is atherosclerotic calcification of the major vessels at the base of the brain. Skull: No skull fracture. Sinuses/Orbits: Clear/normal Other: None significant IMPRESSION: No acute or traumatic finding. Atrophy and chronic small-vessel ischemic changes. Electronically Signed   By: Paulina FusiMark  Shogry M.D.   On: 06/12/2017 14:20   Ct Chest W Contrast  Result  Date: 06/12/2017 CLINICAL DATA:  Larey SeatFell in the bathroom. Trauma to the left chest with pain. EXAM: CT CHEST WITH CONTRAST TECHNIQUE: Multidetector CT imaging of the chest was performed during intravenous contrast administration. CONTRAST:  100mL ISOVUE-300 IOPAMIDOL (ISOVUE-300) INJECTION 61% COMPARISON:  CT abdomen same day FINDINGS: Cardiovascular: Aortic atherosclerosis. No aneurysm or dissection. Coronary artery calcification. Heart size upper limits of normal. No pericardial fluid. Multiple esophageal and paraesophageal varices. Mediastinum/Nodes: No mass or lymphadenopathy. Lungs/Pleura: Pulmonary atelectasis and/or scarring in both lungs, most pronounced in the lower lobes left more than right. Small pleural effusions. No pneumothorax. Upper Abdomen: See results of abdominal scan.  Cirrhosis. Musculoskeletal: Comminuted displaced left posterior seventh rib fracture. Displaced left posterior eighth rib fracture. Displaced posterior left ninth rib fracture. Nondisplaced left posterior tenth rib fracture. No right rib fractures are seen. Massively comminuted fracture of the right proximal humerus. This could be an acute on chronic injury. Old augmented thoracic compression fractures at T7 and T8. Old appearing superior endplate compression fracture at T11. This appears healed. Acute fractures at T12 and L1 as described on the abdominal study. IMPRESSION: Acute left posterior rib fractures affecting the seventh, eighth, ninth and tenth ribs. Seventh, eighth and ninth ribs are displaced. No pneumothorax. Dependent pulmonary atelectasis left more than right. Acute T12 and L1 fractures as described on the abdominal CT. Probably old superior endplate fracture at T11. Old augmented fractures of T7 and T8. Electronically Signed   By: Paulina FusiMark  Shogry M.D.   On: 06/12/2017 14:27   Ct Cervical Spine Wo Contrast  Result Date: 06/12/2017 CLINICAL DATA:  Larey SeatFell in the bath room. EXAM: CT CERVICAL SPINE WITHOUT CONTRAST TECHNIQUE:  Multidetector CT imaging of the cervical spine was performed without intravenous contrast. Multiplanar CT image reconstructions were also generated. COMPARISON:  None. FINDINGS: Alignment: Normal Skull base and vertebrae: No cervical fracture. Soft tissues and spinal canal: Negative Disc levels: Degenerative spondylosis C5-6 and C6-7 with osteophytic encroachment upon the canal and foramina. IMPRESSION: No traumatic cervical spine finding.  Mid cervical spondylosis. Electronically Signed   By: Paulina FusiMark  Shogry M.D.   On: 06/12/2017 14:28   Ct Abdomen Pelvis W Contrast  Result Date: 06/12/2017 CLINICAL DATA:  Larey SeatFell in the bathroom.  Left-sided chest pain. EXAM: CT ABDOMEN AND PELVIS WITH CONTRAST TECHNIQUE: Multidetector CT imaging of the abdomen and pelvis was performed using the standard protocol following bolus administration of intravenous contrast. CONTRAST:  100mL ISOVUE-300 IOPAMIDOL (ISOVUE-300) INJECTION 61% COMPARISON:  None. FINDINGS: Lower chest: Atelectasis at both lung bases. See results of chest CT. Hepatobiliary: Cirrhosis pattern of the liver. No evidence of focal  liver lesion or liver injury. No calcified stones. Pancreas: Normal Spleen: Mild splenomegaly. 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 Multiple splenic varices. Adrenals/Urinary Tract: Adrenal glands are normal. Multiple retroperitoneal varices on the left. No renal lesion. Stomach/Bowel: No acute bowel finding. Vascular/Lymphatic: Aortic atherosclerosis. No aneurysm. IVC is normal. No retroperitoneal adenopathy. Reproductive: Previous hysterectomy. No pelvic mass. Tiny amount of free fluid in the pelvis. Other: No free air. Musculoskeletal: Spinal curvature. Acute superior endplate fracture at T12 with loss of height of 30%. Acute compression fracture at L1 with loss of height of 80%. No retropulsed bone. See results of chest CT for thoracic spinal findings. IMPRESSION: No acute intraabdominal finding. Cirrhosis and  portal venous hypertension. Splenomegaly with extensive varices. Acute superior endplate compression fracture at T12 with loss of height of 30%. Acute compression fracture at L1 with loss of height of 80%. Electronically Signed   By: Paulina Fusi M.D.   On: 06/12/2017 14:19   Dg Chest Portable 1 View  Result Date: 06/12/2017 CLINICAL DATA:  Left-sided chest pain status post fall between the bathtub in the commode. History of lung malignancy, CHF, cirrhosis, and former smoker. EXAM: PORTABLE CHEST 1 VIEW COMPARISON:  Report of a chest x-ray dated February 03, 2014 FINDINGS: The lungs are borderline hypoinflated. There is increased density at the left lung base with partial obscuration of the hemidiaphragm. There is no pneumothorax. No large pleural effusion is observed. The cardiac silhouette is enlarged. The central pulmonary vascularity is engorged with mild cephalization. There is calcification in the wall of the aortic arch. The observed bony thorax is unremarkable. There is an old fracture of the proximal left humerus with displacement of the humeral head. IMPRESSION: CHF with mild pulmonary interstitial edema. I cannot exclude bibasilar atelectasis greatest on the left. There is no pneumothorax or pleural effusion. Given these findings and the patient's symptoms, chest CT scanning would be a useful next imaging step. Electronically Signed   By: David  [place] M.D.   On: 06/12/2017 12:49    EKG: Independently reviewed.   Assessment/Plan Active Problems:   Intractable pain   This is a 71 year old female who presented after suffering a mechanical fall, developing excruciating pain on the left rib cage. She had developed ambulatory dysfunction after a fracture on her left humerus that has affected her balance. On initial physical examination blood pressure 125/58, heart rate 53, respiratory rate 17, oxygen saturation 100%, oral mucosa is dry, lungs were clear to auscultation, decreased ventilation, heart  S1-S2 present rhythmic, the abdomen is protuberant with no organomegaly or peritoneal signs, no lower extremity edema, she does have deformity left shoulder, ecchymosis on the right thigh. Sodium 140, potassium 4.4, chloride 101, bicarbonate 28, glucose 96, BUN 28, creatinine 1.12, white count 11.0, hemoglobin 13.9, hematocrit 42.1, platelets 126. Spine imaging with acute superior endplate fracture at T12 with loss of height of 30%, acute compression fracture L1 with loss of height 80%. Acute left posterior rib fractures affecting the seventh, eighth, ninth and 10th ribs. The seventh eighth and ninth ribs are displaced. No pneumothorax.  Working diagnosis: Intractable pain due to multiple, displaced rib fractures, complicated by compression fracture of T12 and L1.  1. Multiple rib fractures, left rib cage, displaced. No pneumothorax, continue oximetry monitoring, will start patient on a regimen of as needed IV morphine, as needed IV ketorolac and schedule ibuprofen. Physical therapy evaluation. Fall precautions.   2.  Hypertension. Continue propanolol.  3. Type 2 diabetes mellitus. Admission  glucose 96, will place patient on insulin sliding scale for glucose coverage and monitoring, will hold the long-acting insulin.  4. Hypothyroidism. Continue levothyroxine.  5. Depression. Continue Effexor.  6. Diastolic heart failure. Compensated and stable, clinically patient dehydrated, will hold on diuresis, continue gentle IV fluids. Continue beta blockade.  DVT prophylaxis: enoxaparin  Code Status: Full  Family Communication: ] Disposition Plan:  SNF Consults called:  Admission status: inpatient    Uzma Hellmer Annett Gulaaniel Briah Nary MD [facility] Pager [phone]  If 7PM-7AM, please contact night-coverage [email or web address] Password [email or web address]  06/12/2017, 3:31 PM

## 2017-06-12 NOTE — ED Notes (Addendum)
Pt placed on 2L [place] for comfort at this time. O2 sat 92% on RA.

## 2017-06-12 NOTE — ED Notes (Signed)
[facility] at this time.

## 2017-06-12 NOTE — ED Triage Notes (Addendum)
Pt states she was rushing to get to the bathroom and fell landing between the tub and commode. Denies LOC or hitting head. C/o L ribcage pain. Also has old humeral head fx in April, per EMS that she did not follow with ortho and has difficulty moving R arm, not new.

## 2017-06-13 ENCOUNTER — Observation Stay (HOSPITAL_COMMUNITY): Payer: Medicare Other

## 2017-06-13 DIAGNOSIS — S22080A Wedge compression fracture of T11-T12 vertebra, initial encounter for closed fracture: Secondary | ICD-10-CM | POA: Diagnosis present

## 2017-06-13 DIAGNOSIS — F329 Major depressive disorder, single episode, unspecified: Secondary | ICD-10-CM | POA: Diagnosis present

## 2017-06-13 DIAGNOSIS — Z880 Allergy status to penicillin: Secondary | ICD-10-CM | POA: Diagnosis not present

## 2017-06-13 DIAGNOSIS — I5032 Chronic diastolic (congestive) heart failure: Secondary | ICD-10-CM

## 2017-06-13 DIAGNOSIS — M4850XA Collapsed vertebra, not elsewhere classified, site unspecified, initial encounter for fracture: Secondary | ICD-10-CM | POA: Diagnosis present

## 2017-06-13 DIAGNOSIS — R296 Repeated falls: Secondary | ICD-10-CM | POA: Diagnosis present

## 2017-06-13 DIAGNOSIS — Z8249 Family history of ischemic heart disease and other diseases of the circulatory system: Secondary | ICD-10-CM | POA: Diagnosis not present

## 2017-06-13 DIAGNOSIS — K7460 Unspecified cirrhosis of liver: Secondary | ICD-10-CM | POA: Diagnosis present

## 2017-06-13 DIAGNOSIS — Z85118 Personal history of other malignant neoplasm of bronchus and lung: Secondary | ICD-10-CM | POA: Diagnosis not present

## 2017-06-13 DIAGNOSIS — Z6841 Body Mass Index (BMI) 40.0 and over, adult: Secondary | ICD-10-CM | POA: Diagnosis not present

## 2017-06-13 DIAGNOSIS — S2242XK Multiple fractures of ribs, left side, subsequent encounter for fracture with nonunion: Secondary | ICD-10-CM

## 2017-06-13 DIAGNOSIS — E119 Type 2 diabetes mellitus without complications: Secondary | ICD-10-CM | POA: Diagnosis present

## 2017-06-13 DIAGNOSIS — Y92012 Bathroom of single-family (private) house as the place of occurrence of the external cause: Secondary | ICD-10-CM | POA: Diagnosis not present

## 2017-06-13 DIAGNOSIS — Z87891 Personal history of nicotine dependence: Secondary | ICD-10-CM | POA: Diagnosis not present

## 2017-06-13 DIAGNOSIS — J9811 Atelectasis: Secondary | ICD-10-CM | POA: Diagnosis present

## 2017-06-13 DIAGNOSIS — Z90710 Acquired absence of both cervix and uterus: Secondary | ICD-10-CM | POA: Diagnosis not present

## 2017-06-13 DIAGNOSIS — E039 Hypothyroidism, unspecified: Secondary | ICD-10-CM | POA: Diagnosis present

## 2017-06-13 DIAGNOSIS — K7200 Acute and subacute hepatic failure without coma: Secondary | ICD-10-CM | POA: Diagnosis present

## 2017-06-13 DIAGNOSIS — W010XXA Fall on same level from slipping, tripping and stumbling without subsequent striking against object, initial encounter: Secondary | ICD-10-CM | POA: Diagnosis present

## 2017-06-13 DIAGNOSIS — R52 Pain, unspecified: Secondary | ICD-10-CM | POA: Diagnosis not present

## 2017-06-13 DIAGNOSIS — Z801 Family history of malignant neoplasm of trachea, bronchus and lung: Secondary | ICD-10-CM | POA: Diagnosis not present

## 2017-06-13 DIAGNOSIS — E860 Dehydration: Secondary | ICD-10-CM | POA: Diagnosis present

## 2017-06-13 DIAGNOSIS — N179 Acute kidney failure, unspecified: Secondary | ICD-10-CM | POA: Diagnosis present

## 2017-06-13 DIAGNOSIS — I5033 Acute on chronic diastolic (congestive) heart failure: Secondary | ICD-10-CM | POA: Diagnosis present

## 2017-06-13 DIAGNOSIS — I5031 Acute diastolic (congestive) heart failure: Secondary | ICD-10-CM | POA: Diagnosis present

## 2017-06-13 DIAGNOSIS — F419 Anxiety disorder, unspecified: Secondary | ICD-10-CM | POA: Diagnosis present

## 2017-06-13 DIAGNOSIS — K7469 Other cirrhosis of liver: Secondary | ICD-10-CM | POA: Diagnosis not present

## 2017-06-13 DIAGNOSIS — M4855XA Collapsed vertebra, not elsewhere classified, thoracolumbar region, initial encounter for fracture: Secondary | ICD-10-CM | POA: Diagnosis present

## 2017-06-13 DIAGNOSIS — I110 Hypertensive heart disease with heart failure: Secondary | ICD-10-CM | POA: Diagnosis present

## 2017-06-13 DIAGNOSIS — S2242XA Multiple fractures of ribs, left side, initial encounter for closed fracture: Secondary | ICD-10-CM | POA: Diagnosis present

## 2017-06-13 DIAGNOSIS — Z885 Allergy status to narcotic agent status: Secondary | ICD-10-CM | POA: Diagnosis not present

## 2017-06-13 DIAGNOSIS — S42201A Unspecified fracture of upper end of right humerus, initial encounter for closed fracture: Secondary | ICD-10-CM | POA: Diagnosis present

## 2017-06-13 LAB — GLUCOSE, CAPILLARY
GLUCOSE-CAPILLARY: 200 mg/dL — AB (ref 65–99)
Glucose-Capillary: 102 mg/dL — ABNORMAL HIGH (ref 65–99)
Glucose-Capillary: 127 mg/dL — ABNORMAL HIGH (ref 65–99)
Glucose-Capillary: 167 mg/dL — ABNORMAL HIGH (ref 65–99)

## 2017-06-13 LAB — COMPREHENSIVE METABOLIC PANEL
ALT: 36 U/L (ref 14–54)
AST: 70 U/L — AB (ref 15–41)
Albumin: 2.7 g/dL — ABNORMAL LOW (ref 3.5–5.0)
Alkaline Phosphatase: 166 U/L — ABNORMAL HIGH (ref 38–126)
Anion gap: 8 (ref 5–15)
BILIRUBIN TOTAL: 4.4 mg/dL — AB (ref 0.3–1.2)
BUN: 39 mg/dL — AB (ref 6–20)
CO2: 27 mmol/L (ref 22–32)
CREATININE: 1.64 mg/dL — AB (ref 0.44–1.00)
Calcium: 8.6 mg/dL — ABNORMAL LOW (ref 8.9–10.3)
Chloride: 103 mmol/L (ref 101–111)
GFR, EST AFRICAN AMERICAN: 36 mL/min — AB (ref >60)
GFR, EST NON AFRICAN AMERICAN: 31 mL/min — AB (ref >60)
Glucose, Bld: 138 mg/dL — ABNORMAL HIGH (ref 65–99)
POTASSIUM: 3.7 mmol/L (ref 3.5–5.1)
Sodium: 138 mmol/L (ref 135–145)
TOTAL PROTEIN: 6.4 g/dL — AB (ref 6.5–8.1)

## 2017-06-13 LAB — CBC
HEMATOCRIT: 35.9 % — AB (ref 36.0–46.0)
HEMOGLOBIN: 11.8 g/dL — AB (ref 12.0–15.0)
MCH: 33.3 pg (ref 26.0–34.0)
MCHC: 32.9 g/dL (ref 30.0–36.0)
MCV: 101.4 fL — ABNORMAL HIGH (ref 78.0–100.0)
Platelets: 100 10*3/uL — ABNORMAL LOW (ref 150–400)
RBC: 3.54 MIL/uL — AB (ref 3.87–5.11)
RDW: 15.5 % (ref 11.5–15.5)
WBC: 7.9 10*3/uL (ref 4.0–10.5)

## 2017-06-13 LAB — PROTIME-INR
INR: 1.38
PROTHROMBIN TIME: 17.1 s — AB (ref 11.4–15.2)

## 2017-06-13 MED ORDER — TRAMADOL HCL 50 MG PO TABS
50.0000 mg | ORAL_TABLET | Freq: Four times a day (QID) | ORAL | Status: DC | PRN
  Administered 2017-06-14: 50 mg via ORAL
  Filled 2017-06-13 (×2): qty 1

## 2017-06-13 NOTE — Progress Notes (Signed)
Aware of request for eval for VP/KP.  Patient will need to get an MRI to better evaluate these fractures to determine if she is a candidate for intervention.  She has significant cirrhosis and elevation of her LFTs.  We will get an updated PT/INR as well given her liver dysfunction.  We will await MRI results to determine if she is a candidate.  We also need to determine, given patient's documented pain, whether she could lay flat for hours for the procedure and then recovery.  Will follow.  Jennye Runquist E 10:57 AM 06/13/2017

## 2017-06-13 NOTE — Progress Notes (Signed)
PROGRESS NOTE  Nancy Baldwin ZOX:[id number] DOB: 13-Oct-1946 DOA: 06/12/2017 PCP: Ignatius SpeckingVyas, Dhruv B, MD  HPI/Recap of past 5124 hours: 71 year old female with past medical history of diastolic heart failure and cirrhosis and recent severe right humeral fracture this past April who continues to have multiple mechanical falls and presented to the emergency room on 8/2 after she had fallen earlier in the day landing between the bathtub and commode hitting her left shoulder. Patient unable to stand due to excruciating pain on the left side. Found have multiple displaced rib fractures on the left along with an acute compression fracture at T12/L1.   Patient continues to have severe pain on that left side. Interventional radiology consulted for evaluation of compression fracture. Patient complains of hurting all over, mostly on that left side below the axilla  Assessment/Plan: Active Problems:   Diastolic CHF (HCC): No echo on chart. Have ordered 1. BNP mildly elevated today. Noted effusion seen on x-ray on admission   Cirrhosis (HCC): Stable. Patient gets intervention, follow PT/INR   Hypothyroidism continue Synthroid    T12 compression fracture (HCC): Interventional radiology to see. 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  Code Status: Full code   Family Communication: Left message for family   Disposition Plan: We'll be her for several days as we better assess her CHF, get pain under control and INR decides on spinal fracture. May end up needing CIR or skilled nursing   Consultants:  Interventional radiology   Procedures:  Echocardiogram pending   Antimicrobials:  None   DVT prophylaxis: SCDs   Objective: Vitals:   06/13/17 0548 06/13/17  0600 06/13/17 0940 06/13/17 1409  BP: (!) 75/24 90/60 (!) 106/36   Pulse: (!) 57  (!) 59   Resp: 16  14   Temp: 97.7 F (36.5 C)     TempSrc: Oral     SpO2: 100%   100%  Weight:      Height:        Intake/Output Summary (Last 24 hours) at 06/13/17 1612 Last data filed at 06/13/17 1145  Gross per 24 hour  Intake             1920 ml  Output                0 ml  Net             1920 ml   Filed Weights   06/12/17 1157  Weight: 104.3 kg (230 lb)    Exam:   General:  Alert and oriented 3, moderate distress secondary to pain  HEENT: Normocephalic, Atraumatic, mucous membranes are moist  Neck: Supple, no JVD   Cardiovascular: Regular rate and rhythm, S1-S2, 2/6 systolic ejection murmur   Respiratory: Poor inspiratory effort decreased breath sounds bibasilar, breathing not labored   Abdomen: Soft, nontender, obese, positive bowel sounds   Musculoskeletal: No clubbing or cyanosis, trace pitting edema. Right arm in sling  Neuro: No focal deficits   Skin: No skin breaks, tears or lesions other than some bruising on left side on back,   Psychiatry: Patient is appropriate, no evidence of psychoses    Data Reviewed: CBC:  Recent Labs Lab 06/12/17 1244 06/13/17 0742  WBC 11.0* 7.9  NEUTROABS 8.4*  --   HGB 13.9  11.8*  HCT 42.1 35.9*  MCV 100.2* 101.4*  PLT 126* 100*   Basic Metabolic Panel:  Recent Labs Lab 06/12/17 1244 06/13/17 0844  NA 140 138  K 4.4 3.7  CL 101 103  CO2 28 27  GLUCOSE 93 138*  BUN 28* 39*  CREATININE 1.12* 1.64*  CALCIUM 9.1 8.6*   GFR: Estimated Creatinine Clearance: 39.7 mL/min (A) (by C-G formula based on SCr of 1.64 mg/dL (H)). Liver Function Tests:  Recent Labs Lab 06/13/17 0844  AST 70*  ALT 36  ALKPHOS 166*  BILITOT 4.4*  PROT 6.4*  ALBUMIN 2.7*   No results for input(s): LIPASE, AMYLASE in the last 168 hours. No results for input(s): AMMONIA in the last 168 hours. Coagulation Profile:  Recent Labs Lab  06/13/17 0844  INR 1.38   Cardiac Enzymes:  Recent Labs Lab 06/12/17 1244  TROPONINI <0.03   BNP (last 3 results) No results for input(s): PROBNP in the last 8760 hours. HbA1C: No results for input(s): HGBA1C in the last 72 hours. CBG:  Recent Labs Lab 06/12/17 2036 06/13/17 0750 06/13/17 1152 06/13/17 1605  GLUCAP 186* 102* 200* 127*   Lipid Profile: No results for input(s): CHOL, HDL, LDLCALC, TRIG, CHOLHDL, LDLDIRECT in the last 72 hours. Thyroid Function Tests: No results for input(s): TSH, T4TOTAL, FREET4, T3FREE, THYROIDAB in the last 72 hours. Anemia Panel: No results for input(s): VITAMINB12, FOLATE, FERRITIN, TIBC, IRON, RETICCTPCT in the last 72 hours. Urine analysis: No results found for: COLORURINE, APPEARANCEUR, LABSPEC, PHURINE, GLUCOSEU, HGBUR, BILIRUBINUR, KETONESUR, PROTEINUR, UROBILINOGEN, NITRITE, LEUKOCYTESUR Sepsis Labs: @LABRCNTIP (procalcitonin:4,lacticidven:4)  )No results found for this or any previous visit (from the past 240 hour(s)).    Studies: No results found.  Scheduled Meds: . aspirin  81 mg Oral Daily  . enoxaparin (LOVENOX) injection  40 mg Subcutaneous Q24H  . insulin aspart  0-9 Units Subcutaneous TID WC  . levothyroxine  100 mcg Oral QAC breakfast  . mouth rinse  15 mL Mouth Rinse BID  . pantoprazole  40 mg Oral Daily  . pravastatin  10 mg Oral q1800  . propranolol  20 mg Oral BID  . venlafaxine XR  150 mg Oral BID    Continuous Infusions: . dextrose 5 % and 0.9% NaCl 100 mL/hr at 06/13/17 1535     LOS: 0 days     Hollice EspyKRISHNAN,Judah Carchi K, MD [facility] Pager [phone]  If 7PM-7AM, please contact night-coverage [email or web address] Password [email or web address] 06/13/2017, 4:12 PM
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
## 2017-06-13 NOTE — Care Management Obs Status (Signed)
MEDICARE OBSERVATION STATUS NOTIFICATION   Patient Details  Name: Nancy Baldwin MRN: [id number] Date of Birth: April 26, 1946   Medicare Observation Status Notification Given:  Yes    Malcolm MetroChildress, Thi Klich Demske, RN 06/13/2017, 3:08 PM

## 2017-06-13 NOTE — Care Management (Signed)
Pt admitted with intractable pain after falling at home and suffering multiple fx. Pt lives with her husband and is ind pta. [facility] and SW [facility]ta, [facility] rep aware of admission. Pt has cane and walker to use if needed but she was not using them pta. PT eval pending at this time. DC plan SNF vs return home with HH. CSW to follow.

## 2017-06-14 ENCOUNTER — Inpatient Hospital Stay (HOSPITAL_COMMUNITY): Payer: Medicare Other

## 2017-06-14 DIAGNOSIS — I5031 Acute diastolic (congestive) heart failure: Secondary | ICD-10-CM

## 2017-06-14 LAB — ECHOCARDIOGRAM COMPLETE
AO mean calculated velocity dopler: 181 cm/s
AOVTI: 67.9 cm
AV Area VTI: 0.98 cm2
AV Peak grad: 34 mmHg
AV VEL mean LVOT/AV: 0.47
AV peak Index: 0.43
AV pk vel: 290 cm/s
AV vel: 1.07
AVA: 1.07 cm2
AVAREAMEANV: 1.06 cm2
AVAREAMEANVIN: 0.47 cm2/m2
AVAREAVTIIND: 0.47 cm2/m2
AVG: 16 mmHg
Ao pk vel: 0.43 m/s
CHL CUP DOP CALC LVOT VTI: 31.9 cm
E/e' ratio: 14.84
EWDT: 363 ms
FS: 47 % — AB (ref 28–44)
Height: 67 in
IVS/LV PW RATIO, ED: 0.98
LA diam index: 1.81 cm/m2
LA vol index: 30 mL/m2
LA vol: 67.9 mL
LASIZE: 41 mm
LAVOLA4C: 80.9 mL
LEFT ATRIUM END SYS DIAM: 41 mm
LV PW d: 12.7 mm — AB (ref 0.6–1.1)
LV SIMPSON'S DISK: 64
LV TDI E'LATERAL: 9.03
LV TDI E'MEDIAL: 7.72
LV dias vol index: 25 mL/m2
LVDIAVOL: 56 mL (ref 46–106)
LVEEAVG: 14.84
LVEEMED: 14.84
LVELAT: 9.03 cm/s
LVOT area: 2.27 cm2
LVOT diameter: 17 mm
LVOT peak grad rest: 6 mmHg
LVOTPV: 125 cm/s
LVOTSV: 72 mL
LVOTVTI: 0.47 cm
LVSYSVOL: 20 mL (ref 14–42)
LVSYSVOLIN: 9 mL/m2
MV Dec: 363
MV Peak grad: 7 mmHg
MV pk E vel: 134 m/s
MVPKAVEL: 93.6 m/s
RV LATERAL S' VELOCITY: 15.3 cm/s
RV sys press: 31 mmHg
Reg peak vel: 263 cm/s
Stroke v: 36 ml
TAPSE: 28.7 mm
TRMAXVEL: 263 cm/s
Valve area index: 0.47
Weight: 3680 oz

## 2017-06-14 LAB — CBC
HCT: 38.4 % (ref 36.0–46.0)
HEMOGLOBIN: 12.7 g/dL (ref 12.0–15.0)
MCH: 33.5 pg (ref 26.0–34.0)
MCHC: 33.1 g/dL (ref 30.0–36.0)
MCV: 101.3 fL — ABNORMAL HIGH (ref 78.0–100.0)
PLATELETS: 96 10*3/uL — AB (ref 150–400)
RBC: 3.79 MIL/uL — AB (ref 3.87–5.11)
RDW: 15.6 % — ABNORMAL HIGH (ref 11.5–15.5)
WBC: 7.1 10*3/uL (ref 4.0–10.5)

## 2017-06-14 LAB — GLUCOSE, CAPILLARY
GLUCOSE-CAPILLARY: 42 mg/dL — AB (ref 65–99)
GLUCOSE-CAPILLARY: 190 mg/dL — AB (ref 65–99)
Glucose-Capillary: 33 mg/dL — CL (ref 65–99)
Glucose-Capillary: 139 mg/dL — ABNORMAL HIGH (ref 65–99)
Glucose-Capillary: 182 mg/dL — ABNORMAL HIGH (ref 65–99)
Glucose-Capillary: 71 mg/dL (ref 65–99)

## 2017-06-14 LAB — BASIC METABOLIC PANEL
ANION GAP: 6 (ref 5–15)
BUN: 30 mg/dL — ABNORMAL HIGH (ref 6–20)
CALCIUM: 8.3 mg/dL — AB (ref 8.9–10.3)
CO2: 28 mmol/L (ref 22–32)
CREATININE: 1.32 mg/dL — AB (ref 0.44–1.00)
Chloride: 106 mmol/L (ref 101–111)
GFR calc non Af Amer: 40 mL/min — ABNORMAL LOW (ref >60)
GFR, EST AFRICAN AMERICAN: 46 mL/min — AB (ref >60)
Glucose, Bld: 119 mg/dL — ABNORMAL HIGH (ref 65–99)
Potassium: 4.3 mmol/L (ref 3.5–5.1)
Sodium: 140 mmol/L (ref 135–145)

## 2017-06-14 LAB — BRAIN NATRIURETIC PEPTIDE: B NATRIURETIC PEPTIDE 5: 306 pg/mL — AB (ref 0.0–100.0)

## 2017-06-14 MED ORDER — PERFLUTREN LIPID MICROSPHERE
1.0000 mL | INTRAVENOUS | Status: AC | PRN
  Administered 2017-06-14 (×2): 1 mL via INTRAVENOUS
  Filled 2017-06-14: qty 10

## 2017-06-14 MED ORDER — GLUCOSE 40 % PO GEL
ORAL | Status: AC
  Administered 2017-06-14: 37.5 g
  Filled 2017-06-14: qty 1

## 2017-06-14 NOTE — Evaluation (Signed)
Occupational Therapy Evaluation Patient Details Name: Nancy Baldwin MRN: [id number] DOB: [birth year] Today's Date: 06/14/2017    History of Present Illness 71 year old female with past medical history of diastolic heart failure and cirrhosis and recent severe right humeral fracture this past April who continues to have multiple mechanical falls and presented to the emergency room on 8/2 after she had fallen earlier in the day landing between the bathtub and commode hitting her left shoulder. Patient unable to stand due to excruciating pain on the left side. Found have multiple displaced rib fractures on the left along with an acute compression fracture at T12/L1.    Clinical Impression   Pt in bed upon therapy arrival with right UE in sling although not correctly. Patient's chart is very confusing regarding her RUE and LUE. Orders are placed for the sling to be placed on her LUE. Per patient and husband, the RUE has been re-fractured. (Chronic right proximal humerus in April 2018). No x-ray has been completed of RUE. In Chest CT finding it reports: "Massively comminuted fracture of the right proximal humerus. This could be an acute on chronic injury." Patient is in severe pain with all movement. Limited UE movement achieved due to pain in right shoulder and left side ribs. Patient was educated on HEP including active ROM bilateral elbow, wrist, and hand ranges, right shoulder precautions, proper positioning of right UE while in sling and in bed. Husband present during portion of evaluation and verbalized understanding of positioning and exercises. Patient will benefit from skilled OT services for mentioned deficits to allow her to complete functional daily tasks with less difficulty. Recommend D/C to SNF prior to returning home. Patient left with husband. RUE positioned correctly in sling with pillow under elbow. Ice packs placed on RUE and left side ribs for pain. Communication completed with nursing  regarding confusion of UE orders.      Follow Up Recommendations  SNF    Equipment Recommendations  None recommended by OT       Precautions / Restrictions Precautions Precautions: Fall;Shoulder;Back;Other (comment) Type of Shoulder Precautions: broken Rt humerus.  Shoulder Interventions: Shoulder sling/immobilizer;At all times;Off for dressing/bathing/exercises Restrictions Weight Bearing Restrictions: Yes RUE Weight Bearing: Non weight bearing (Due to recent right humerus fracture)             ADL either performed or assessed with clinical judgement   ADL Overall ADL's : Needs assistance/impaired         General ADL Comments: Patient is Total Assist for bathing, dressing, grooming and toileting at this time due to pain.      Vision Baseline Vision/History: No visual deficits Patient Visual Report: No change from baseline              Pertinent Vitals/Pain Pain Assessment: No/denies pain Pain Score: 7  Pain Location: Left side ribs and right shoulder pain Pain Intervention(s): Premedicated before session;Ice applied;Limited activity within patient's tolerance;Monitored during session     Hand Dominance Right   Extremity/Trunk Assessment Upper Extremity Assessment Upper Extremity Assessment: RUE deficits/detail;LUE deficits/detail RUE Deficits / Details: Patient in sling to immobilize RUE. Patient able to complete active elbow flexion/extension, supination/pronation, and wrist flexion/extension. ROM is not functional and is limited due to right shoulder pain. Therapist able to passively range elbow  and wrist fully with pain noted in right shoulder. Gross grip strength is weak although functional.  LUE Deficits / Details: patient's ROM limited due to pain of left rib fracture. Passively patient was able to complete  shoulder flexion to [facility] and shoulder abduction to 90 degrees. Patient able to complete active elbow flexion/extension, pronation/supination, and wrist  flexion/extension although range was not full or functional. This limitation in ROM was due to left side rib pain. LUE: Unable to fully assess due to pain   Lower Extremity Assessment Lower Extremity Assessment: Defer to PT evaluation       Communication Communication Communication: No difficulties   Cognition Arousal/Alertness: Lethargic Behavior During Therapy: WFL for tasks assessed/performed Overall Cognitive Status: Within Functional Limits for tasks assessed            Exercises Hand Exercises Forearm Supination: PROM;10 reps;AROM;5 reps;Supine Forearm Pronation: PROM;10 reps;AROM;5 reps;Supine Wrist Flexion: PROM;AROM;10 reps;Supine Wrist Extension: PROM;AROM;10 reps;Supine Other Exercises Other Exercises: towel squeeze for bilateral grip: 10 reps. 1 set   Shoulder Instructions Shoulder Instructions Donning/doffing sling/immobilizer:  (Total Assist with education provided to patient and husband) Correct positioning of sling/immobilizer:  (Total assist with education provided to patient and husband) Sling wearing schedule (on at all times/off for ADL's):  (Total assist) Positioning of UE while sleeping:  (Total Assist)    Home Living Family/patient expects to be discharged to:: Skilled nursing facility Living Arrangements: Spouse/significant other Available Help at Discharge:  (Husband works 5 jobs and is not available to assist with patient's physical needs) Type of Home: House Home Access: Stairs to enter [employer] of Steps: 7 Entrance Stairs-Rails: Right;Left;Can reach both Home Layout: One level               Home Equipment: Cane - single point;Walker - 2 wheels;Shower seat          Prior Functioning/Environment Level of Independence: Needs assistance  Gait / Transfers Assistance Needed: homebound furniture walker x2+ years  ADL's / Homemaking Assistance Needed: Reports that her husband assists her with bathing, dressing, and toiletting.              OT Problem List: Decreased strength;Decreased range of motion;Decreased activity tolerance;Decreased knowledge of use of DME or AE;Impaired UE functional use;Decreased safety awareness;Pain;Decreased knowledge of precautions      OT Treatment/Interventions: Self-care/ADL training;Therapeutic exercise;Patient/family education;Modalities;Manual therapy;Therapeutic activities;DME and/or AE instruction    OT Goals(Current goals can be found in the care plan section) Acute Rehab OT Goals Patient Stated Goal: To be pain free OT Goal Formulation: With patient Time For Goal Achievement: 06/28/17 Potential to Achieve Goals: Fair  OT Frequency: Min 2X/week   Barriers to D/C: Decreased caregiver support  Husband works 5 jobs and is unable to provide physical assistance.          AM-PAC PT "6 Clicks" Daily Activity     Outcome Measure Help from another person eating meals?: Total Help from another person taking care of personal grooming?: Total Help from another person toileting, which includes using toliet, bedpan, or urinal?: Total Help from another person bathing (including washing, rinsing, drying)?: Total Help from another person to put on and taking off regular upper body clothing?: Total Help from another person to put on and taking off regular lower body clothing?: Total 6 Click Score: 6   End of Session Equipment Utilized During Treatment: Other (comment);Oxygen (right UE sling) Nurse Communication: Precautions;Other (comment) (confusion on UE that is needing a sling. Order states LUE although patient is wearing it on RUE. Confirmed RUE by husband and patient)  Activity Tolerance: Patient limited by fatigue;Patient limited by pain Patient left: in bed;with call bell/phone within reach;with family/visitor present  OT Visit Diagnosis: Muscle weakness (generalized) (  M62.81)                Time: 0981-1914: 1858-1956 OT Time Calculation (min): 58 min Charges:  OT General  Charges $OT Visit: 1 Procedure OT Evaluation $OT Eval Moderate Complexity: 1 Procedure OT Treatments $Self Care/Home Management : 8-22 mins (21') $Therapeutic Exercise: 23-37 mins (30') G-Codes: OT G-codes **NOT FOR INPATIENT CLASS** Functional Assessment Tool Used: AM-PAC 6 Clicks Daily Activity Functional Limitation: Self care Self Care Current Status (N8295(G8987): 100 percent impaired, limited or restricted Self Care Goal Status (A2130(G8988): At least 80 percent but less than 100 percent impaired, limited or restricted   Limmie PatriciaLaura Daryn Pisani, OTR/L,CBIS  [phone]   Kayson Bullis, Nancy MarchLaura D 06/14/2017, 8:28 PM

## 2017-06-14 NOTE — Progress Notes (Signed)
PROGRESS NOTE  Nancy GentileKacie S Baldwin AVW:[id number] DOB: August 12, 1946 DOA: 06/12/2017 PCP: Ignatius SpeckingVyas, Dhruv B, MD  HPI/Recap of past 8424 hours: 71 year old female with past medical history of diastolic heart failure and cirrhosis and recent severe right humeral fracture this past April who continues to have multiple mechanical falls and presented to the emergency room on 8/2 after she had fallen earlier in the day landing between the bathtub and commode hitting her left shoulder. Patient unable to stand due to excruciating pain on the left side. Found have multiple displaced rib fractures on the left along with an acute compression fracture at T12/L1.   Patient continues to have severe pain on that left side. Interventional radiology consulted for evaluation of compression fracture. Patient complains of hurting all over, mostly on that left side below the axilla.  She did however get a good night sleep is feeling a little bit better. Breathing is stable.  Assessment/Plan: Active Problems:   Diastolic CHF [facility](HCC): Echocardiogram ordered and results pending. No previous echo in records. BNP mildly elevated today. Noted effusion seen on x-ray on admission   Cirrhosis (HCC): Stable. If patient gets kyphoplasty, follow PT/INR   Hypothyroidism continue Synthroid   T12 compression fracture (HCC): Interventional radiology to see. 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 Responding to fluids. Down to 1.32 creatinine today. Baseline around 1.12    Code Status: Full code   Family Communication: Left message for family   Disposition Plan: We'll be her for several days as we better  assess her CHF, get pain under control and I interventional radiology decides on spinal fracture. For skilled nursing, and a few days   Consultants:  Interventional radiology   Procedures:  Echocardiogram pending   Antimicrobials:  None   DVT prophylaxis: SCDs   Objective: Vitals:   06/13/17 1409 06/13/17 1700 06/13/17 2130 06/14/17 0559  BP: 109/63  138/82 (!) 111/52  Pulse: 63 (!) 58 (!) 58 (!) 54  Resp: 17  18 20   Temp: 97.9 F (36.6 C)  97.8 F (36.6 C) 97.7 F (36.5 C)  TempSrc: Oral  Oral Oral  SpO2: 100% 99% 98% 97%  Weight:      Height:        Intake/Output Summary (Last 24 hours) at 06/14/17 1414 Last data filed at 06/13/17 1830  Gross per 24 hour  Intake                0 ml  Output              250 ml  Net             -250 ml   Filed Weights   06/12/17 1157  Weight: 104.3 kg (230 lb)    Exam:   General:  Alert and oriented 3Mild distress secondary to pain  HEENT: Normocephalic, Atraumatic, mucous membranes are moist  Neck: Supple, no JVD   Cardiovascular: Regular rate and rhythm, S1-S2, 2/6 systolic ejection murmur   Respiratory: Poor inspiratory effort decreased breath sounds bibasilar-better from previous day , breathing not labored   Abdomen: Soft, nontender, obese, positive bowel sounds   Musculoskeletal: No clubbing or cyanosis, trace pitting edema. Right arm in sling  Neuro: No  focal deficits   Skin: No skin breaks, tears or lesions other than some bruising on left side on back,   Psychiatry: Patient is appropriate, no evidence of psychoses    Data Reviewed: CBC:  Recent Labs Lab 06/12/17 1244 06/13/17 0742 06/14/17 0624  WBC 11.0* 7.9 7.1  NEUTROABS 8.4*  --   --   HGB 13.9 11.8* 12.7  HCT 42.1 35.9* 38.4  MCV 100.2* 101.4* 101.3*  PLT 126* 100* 96*   Basic Metabolic Panel:  Recent Labs Lab 06/12/17 1244 06/13/17 0844 06/14/17 0624  NA 140 138 140  K 4.4 3.7 4.3  CL 101 103 106  CO2 28 27 28   GLUCOSE 93  138* 119*  BUN 28* 39* 30*  CREATININE 1.12* 1.64* 1.32*  CALCIUM 9.1 8.6* 8.3*   GFR: Estimated Creatinine Clearance: 49.3 mL/min (A) (by C-G formula based on SCr of 1.32 mg/dL (H)). Liver Function Tests:  Recent Labs Lab 06/13/17 0844  AST 70*  ALT 36  ALKPHOS 166*  BILITOT 4.4*  PROT 6.4*  ALBUMIN 2.7*   No results for input(s): LIPASE, AMYLASE in the last 168 hours. No results for input(s): AMMONIA in the last 168 hours. Coagulation Profile:  Recent Labs Lab 06/13/17 0844  INR 1.38   Cardiac Enzymes:  Recent Labs Lab 06/12/17 1244  TROPONINI <0.03   BNP (last 3 results) No results for input(s): PROBNP in the last 8760 hours. HbA1C: No results for input(s): HGBA1C in the last 72 hours. CBG:  Recent Labs Lab 06/13/17 2127 06/14/17 0728 06/14/17 0748 06/14/17 0809 06/14/17 1130  GLUCAP 167* 33* 42* 139* 182*   Lipid Profile: No results for input(s): CHOL, HDL, LDLCALC, TRIG, CHOLHDL, LDLDIRECT in the last 72 hours. Thyroid Function Tests: No results for input(s): TSH, T4TOTAL, FREET4, T3FREE, THYROIDAB in the last 72 hours. Anemia Panel: No results for input(s): VITAMINB12, FOLATE, FERRITIN, TIBC, IRON, RETICCTPCT in the last 72 hours. Urine analysis: No results found for: COLORURINE, APPEARANCEUR, LABSPEC, PHURINE, GLUCOSEU, HGBUR, BILIRUBINUR, KETONESUR, PROTEINUR, UROBILINOGEN, NITRITE, LEUKOCYTESUR Sepsis Labs: @LABRCNTIP (procalcitonin:4,lacticidven:4)  )No results found for this or any previous visit (from the past 240 hour(s)).    Studies: No results found.  Scheduled Meds: . aspirin  81 mg Oral Daily  . enoxaparin (LOVENOX) injection  40 mg Subcutaneous Q24H  . insulin aspart  0-9 Units Subcutaneous TID WC  . levothyroxine  100 mcg Oral QAC breakfast  . mouth rinse  15 mL Mouth Rinse BID  . pantoprazole  40 mg Oral Daily  . pravastatin  10 mg Oral q1800  . propranolol  20 mg Oral BID  . venlafaxine XR  150 mg Oral BID     Continuous Infusions: . dextrose 5 % and 0.9% NaCl 100 mL/hr at 06/14/17 0640     LOS: 1 day     Hollice EspyKRISHNAN,Arlynn Stare K, MD [facility] Pager [phone]  If 7PM-7AM, please contact night-coverage [email or web address] Password [email or web address] 06/14/2017, 2:14 PM

## 2017-06-14 NOTE — Progress Notes (Signed)
*  PRELIMINARY RESULTS* Echocardiogram 2D Echocardiogram has been performed with Definity.  Stacey DrainWhite, Keimani Laufer J 06/14/2017, 2:23 PM

## 2017-06-14 NOTE — Progress Notes (Signed)
Glucose gel given for low blood glucose. Pt. Alert, no complaints.

## 2017-06-15 DIAGNOSIS — S2242XA Multiple fractures of ribs, left side, initial encounter for closed fracture: Secondary | ICD-10-CM | POA: Diagnosis present

## 2017-06-15 LAB — BASIC METABOLIC PANEL
ANION GAP: 5 (ref 5–15)
BUN: 18 mg/dL (ref 6–20)
CALCIUM: 8.2 mg/dL — AB (ref 8.9–10.3)
CO2: 26 mmol/L (ref 22–32)
Chloride: 110 mmol/L (ref 101–111)
Creatinine, Ser: 0.93 mg/dL (ref 0.44–1.00)
GLUCOSE: 136 mg/dL — AB (ref 65–99)
POTASSIUM: 4.3 mmol/L (ref 3.5–5.1)
Sodium: 141 mmol/L (ref 135–145)

## 2017-06-15 LAB — GLUCOSE, CAPILLARY
GLUCOSE-CAPILLARY: 219 mg/dL — AB (ref 65–99)
GLUCOSE-CAPILLARY: 98 mg/dL (ref 65–99)
Glucose-Capillary: 78 mg/dL (ref 65–99)
Glucose-Capillary: 149 mg/dL — ABNORMAL HIGH (ref 65–99)

## 2017-06-15 LAB — HEMOGLOBIN A1C
HEMOGLOBIN A1C: 5.6 % (ref 4.8–5.6)
Mean Plasma Glucose: 114 mg/dL

## 2017-06-15 MED ORDER — TRAMADOL HCL 50 MG PO TABS
50.0000 mg | ORAL_TABLET | Freq: Four times a day (QID) | ORAL | Status: DC | PRN
  Administered 2017-06-15: 50 mg via ORAL
  Filled 2017-06-15: qty 1
  Filled 2017-06-15: qty 2

## 2017-06-15 NOTE — Progress Notes (Signed)
On assessment tonight patient is unable to follow directions according to pain score, PAINAD scale used instead of verbal scale, patient cannot explain her pain level.  Patient is alert and oriented to self, cannot tell me the day of week, is able to describe she is in hospital and which one, unable to tell me her birth date, does know the name of the president.

## 2017-06-15 NOTE — Progress Notes (Signed)
PROGRESS NOTE    Nancy GentileKacie S Baldwin  ZOX:[id number] DOB: 09-06-1946 DOA: 06/12/2017 PCP: Ignatius SpeckingVyas, Dhruv B, MD   Brief Narrative:  71 year old female with past medical history of diastolic heart failure and cirrhosis and recent severe right humeral fracture this past April who continues to have multiple mechanical falls and presented to the emergency room on 8/2 after she had fallen earlier in the day landing between the bathtub and commode hitting her left shoulder. Patient unable to stand due to excruciating pain on the left side. Found have multiple displaced rib fractures on the left along with an acute compression fracture at T12/L1.   Patient continues to have severe pain on that left side. Interventional radiology consulted for evaluation of compression fracture. Patient complains of hurting all over, mostly on that left side below the axilla.     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 I/O's not correctly recorded. Daily weights none recorded either. Patient with clinical improvement. 2-D echo with a EF of 60-65%, no wall motion abnormalities, grade 2 diastolic dysfunction, mild to moderate aortic valve stenosis. Continue aspirin, propranolol, Pravachol. Outpatient follow-up.  #2 hypothyroidism Continue home dose Synthroid.  #3 cirrhosis Stable. Patient seems compensated. Spironolactone and Lasix have been held since admission due to dehydration. Saline lock IV fluids. Monitor renal function and blood pressure closely. Likely need to resume spironolactone and Lasix in the near future. I   #4 T12 compression fracture/rib fractures Patient was seen by interventional radiology to  evaluate for VP versus kyphoplasty. MRI was ordered however the Epic notes is noted that patient was too obese to fit in the MRI machine and as such this was not done. Continue pain management. Incentive spirometry. Follow.  #5 recurrent falls PT/OT. Patient needs skilled nursing facility.   #6 morbid obesity    DVT prophylaxis: Lovenox Code Status: Full Family Communication: Updated patient. No family at bedside. Disposition Plan: Skilled nursing facility once pain is better controlled and pending IR recommendations as patient was unable to have the MRI done as it was stated on prior notes that patient was too obese.   Consultants:   Interventional radiology 06/13/2017  Procedures:   Chest x-ray 06/12/2017  CT head 06/12/2017  CT chest 06/12/2017  CT C-spine 06/12/2017  CT abdomen and pelvis 06/12/2017  2-D echo 06/14/2017  Antimicrobials:   None   Subjective: Patient complaining of left-sided rib pain. Patient denies shortness of breath. No chest pain.  Objective: Vitals:   06/14/17 0559 06/14/17 1426 06/14/17 2140 06/15/17 0552  BP: (!) 111/52 114/62 (!) 161/49 (!) 161/58  Pulse: (!) 54 99 (!) 57 (!) 53  Resp: 20 20 20 13   Temp: 97.7 F (36.5 C) 97.8 F (36.6 C) 98.2 F (36.8 C) 98.2 F (36.8 C)  TempSrc: Oral Oral Oral Oral  SpO2: 97% 100% 100% 100%  Weight:      Height:        Intake/Output Summary (Last 24 hours) at 06/15/17 1231 Last data filed at 06/15/17 0800  Gross per 24 hour  Intake              240 ml  Output  0 ml  Net              240 ml   Filed Weights   06/12/17 1157  Weight: 104.3 kg (230 lb)    Examination:  General exam: Appears calm and comfortable  Respiratory system: Clear to auscultation. Respiratory effort normal. Cardiovascular system: S1 & S2 heard, RRR. No JVD, murmurs, rubs, gallops or clicks. No pedal edema. Gastrointestinal system: Abdomen is nondistended, soft and nontender. No organomegaly or  masses felt. Normal bowel sounds heard. Central nervous system: Alert and oriented. No focal neurological deficits. Extremities: Right upper extremity in sling. Symmetric 5 x 5 power. Skin: No rashes, lesions or ulcers Psychiatry: Judgement and insight appear normal. Mood & affect appropriate.     Data Reviewed: I have personally reviewed following labs and imaging studies  CBC:  Recent Labs Lab 06/12/17 1244 06/13/17 0742 06/14/17 0624  WBC 11.0* 7.9 7.1  NEUTROABS 8.4*  --   --   HGB 13.9 11.8* 12.7  HCT 42.1 35.9* 38.4  MCV 100.2* 101.4* 101.3*  PLT 126* 100* 96*   Basic Metabolic Panel:  Recent Labs Lab 06/12/17 1244 06/13/17 0844 06/14/17 0624 06/15/17 0606  NA 140 138 140 141  K 4.4 3.7 4.3 4.3  CL 101 103 106 110  CO2 28 27 28 26   GLUCOSE 93 138* 119* 136*  BUN 28* 39* 30* 18  CREATININE 1.12* 1.64* 1.32* 0.93  CALCIUM 9.1 8.6* 8.3* 8.2*   GFR: Estimated Creatinine Clearance: 69.9 mL/min (by C-G formula based on SCr of 0.93 mg/dL). Liver Function Tests:  Recent Labs Lab 06/13/17 0844  AST 70*  ALT 36  ALKPHOS 166*  BILITOT 4.4*  PROT 6.4*  ALBUMIN 2.7*   No results for input(s): LIPASE, AMYLASE in the last 168 hours. No results for input(s): AMMONIA in the last 168 hours. Coagulation Profile:  Recent Labs Lab 06/13/17 0844  INR 1.38   Cardiac Enzymes:  Recent Labs Lab 06/12/17 1244  TROPONINI <0.03   BNP (last 3 results) No results for input(s): PROBNP in the last 8760 hours. HbA1C:  Recent Labs  06/14/17 1522  HGBA1C 5.6   CBG:  Recent Labs Lab 06/14/17 1130 06/14/17 1639 06/14/17 2144 06/15/17 0727 06/15/17 1108  GLUCAP 182* 71 190* 78 219*   Lipid Profile: No results for input(s): CHOL, HDL, LDLCALC, TRIG, CHOLHDL, LDLDIRECT in the last 72 hours. Thyroid Function Tests: No results for input(s): TSH, T4TOTAL, FREET4, T3FREE, THYROIDAB in the last 72 hours. Anemia Panel: No results for input(s): VITAMINB12,  FOLATE, FERRITIN, TIBC, IRON, RETICCTPCT in the last 72 hours. Sepsis Labs: No results for input(s): PROCALCITON, LATICACIDVEN in the last 168 hours.  No results found for this or any previous visit (from the past 240 hour(s)).       Radiology Studies: No results found.      Scheduled Meds: . aspirin  81 mg Oral Daily  . enoxaparin (LOVENOX) injection  40 mg Subcutaneous Q24H  . insulin aspart  0-9 Units Subcutaneous TID WC  . levothyroxine  100 mcg Oral QAC breakfast  . mouth rinse  15 mL Mouth Rinse BID  . pantoprazole  40 mg Oral Daily  . pravastatin  10 mg Oral q1800  . propranolol  20 mg Oral BID  . venlafaxine XR  150 mg Oral BID   Continuous Infusions:   LOS: 2 days    Time spent: 35 minutes    Tyresse Jayson, MD [facility] Pager [phone]  If 7PM-7AM, please  contact night-coverage [email or web address] Password [facility] 06/15/2017, 12:31 PM

## 2017-06-15 NOTE — Progress Notes (Signed)
Patient unable to follow instructions for use of IS . Will ask nightshift RT to instruct patient on IS.

## 2017-06-16 DIAGNOSIS — K7682 Hepatic encephalopathy: Secondary | ICD-10-CM | POA: Diagnosis present

## 2017-06-16 DIAGNOSIS — K7200 Acute and subacute hepatic failure without coma: Secondary | ICD-10-CM | POA: Diagnosis present

## 2017-06-16 DIAGNOSIS — IMO0001 Reserved for inherently not codable concepts without codable children: Secondary | ICD-10-CM | POA: Diagnosis present

## 2017-06-16 DIAGNOSIS — M4850XA Collapsed vertebra, not elsewhere classified, site unspecified, initial encounter for fracture: Secondary | ICD-10-CM

## 2017-06-16 LAB — URINALYSIS, ROUTINE W REFLEX MICROSCOPIC
Bilirubin Urine: NEGATIVE
Glucose, UA: NEGATIVE mg/dL
Hgb urine dipstick: NEGATIVE
Ketones, ur: NEGATIVE mg/dL
Nitrite: NEGATIVE
PH: 5 (ref 5.0–8.0)
Protein, ur: NEGATIVE mg/dL
SPECIFIC GRAVITY, URINE: 1.016 (ref 1.005–1.030)

## 2017-06-16 LAB — GLUCOSE, CAPILLARY
GLUCOSE-CAPILLARY: 125 mg/dL — AB (ref 65–99)
GLUCOSE-CAPILLARY: 60 mg/dL — AB (ref 65–99)
GLUCOSE-CAPILLARY: 132 mg/dL — AB (ref 65–99)
GLUCOSE-CAPILLARY: 101 mg/dL — AB (ref 65–99)
Glucose-Capillary: 89 mg/dL (ref 65–99)

## 2017-06-16 LAB — BASIC METABOLIC PANEL
ANION GAP: 4 — AB (ref 5–15)
BUN: 16 mg/dL (ref 6–20)
CALCIUM: 8.3 mg/dL — AB (ref 8.9–10.3)
CO2: 28 mmol/L (ref 22–32)
Chloride: 112 mmol/L — ABNORMAL HIGH (ref 101–111)
Creatinine, Ser: 0.9 mg/dL (ref 0.44–1.00)
GFR calc non Af Amer: >60 mL/min (ref >60)
GLUCOSE: 144 mg/dL — AB (ref 65–99)
Potassium: 4.5 mmol/L (ref 3.5–5.1)
Sodium: 144 mmol/L (ref 135–145)

## 2017-06-16 LAB — TSH: TSH: 26.569 u[IU]/mL — AB (ref 0.350–4.500)

## 2017-06-16 LAB — AMMONIA: Ammonia: 110 umol/L — ABNORMAL HIGH (ref 9–35)

## 2017-06-16 LAB — VITAMIN B12: VITAMIN B 12: 2211 pg/mL — AB (ref 180–914)

## 2017-06-16 MED ORDER — FUROSEMIDE 40 MG PO TABS
40.0000 mg | ORAL_TABLET | Freq: Two times a day (BID) | ORAL | Status: DC
  Administered 2017-06-16 – 2017-06-19 (×7): 40 mg via ORAL
  Filled 2017-06-16 (×7): qty 1

## 2017-06-16 MED ORDER — LACTULOSE 10 GM/15ML PO SOLN
30.0000 g | Freq: Three times a day (TID) | ORAL | Status: DC
  Administered 2017-06-16 – 2017-06-17 (×3): 30 g via ORAL
  Filled 2017-06-16 (×3): qty 60

## 2017-06-16 MED ORDER — LEVOTHYROXINE SODIUM 25 MCG PO TABS
125.0000 ug | ORAL_TABLET | Freq: Every day | ORAL | Status: DC
  Administered 2017-06-17 – 2017-06-19 (×3): 125 ug via ORAL
  Filled 2017-06-16 (×3): qty 1

## 2017-06-16 MED ORDER — RIFAXIMIN 550 MG PO TABS
550.0000 mg | ORAL_TABLET | Freq: Two times a day (BID) | ORAL | Status: DC
  Administered 2017-06-16 – 2017-06-19 (×6): 550 mg via ORAL
  Filled 2017-06-16 (×6): qty 1

## 2017-06-16 NOTE — Progress Notes (Signed)
Urine with some squamous cells present, but some leukocytes as well.  Will await urine culture.  Patient also needs her mentation improved prior to procedure.  Insurance authorization still pending.  Likely will not be tomorrow.  Will follow.  Nancy Baldwin E 3:37 PM 06/16/2017

## 2017-06-16 NOTE — Progress Notes (Signed)
Dr. Corliss Skainseveshwar would be agreeable to fix her T12 fracture despite not having an MRI.  However, she does need a UA prior to her procedure.  Her insurance authorization is also pending.  Overnight it was noted that patient was essentially A&O to herself, but could not give a date or her DOB, etc.  This raises concerns about her ability to follow commands during a procedure.  She also has multiple rib fractures and we need to make sure she can lay flat on her stomach and back for an extended period of time in order to do this procedure.  I have spoken with Dr. Janee Mornhompson.  The confusion is likely felt to be secondary to her cirrhosis as her lasix and spironolactone have been held since admission.  She has an ammonia level pending and may require some lactulose.  Will follow for mentation as well as insurance authorization to determine timing of procedure.  Dane Bloch E 9:24 AM 06/16/2017

## 2017-06-16 NOTE — Progress Notes (Signed)
PROGRESS NOTE    Nancy Baldwin  ZOX:[id number] DOB: [birth year] DOA: 06/12/2017 PCP: Ignatius Specking, MD   Brief Narrative:  71 year old female with past medical history of diastolic heart failure and cirrhosis and recent severe right humeral fracture this past April who continues to have multiple mechanical falls and presented to the emergency room on 8/2 after she had fallen earlier in the day landing between the bathtub and commode hitting her left shoulder. Patient unable to stand due to excruciating pain on the left side. Found have multiple displaced rib fractures on the left along with an acute compression fracture at T12/L1.   Patient continues to have severe pain on that left side. Interventional radiology consulted for evaluation of compression fracture. Patient complains of hurting all over, mostly on that left side below the axilla.   Patient on the evening of 06/15/2017 noted to be confused and alert to self only. Ammonia level obtained elevated at 110. Patient likely with hepatic encephalopathy. Patient's Lasix and spironolactone initially held during the hospitalization. Lasix being resumed at 40 mg daily. Patient will be started on lactulose 30 g 3 times a day as well as Xifaxan.    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 I/O's not correctly recorded. Daily weights none recorded either. Patient with clinical improvement. 2-D echo with a EF of 60-65%, no wall motion abnormalities, grade 2 diastolic dysfunction, mild to moderate aortic valve  stenosis. Continue aspirin, propranolol, Pravachol. Resume Lasix at 40 mg daily. Outpatient follow-up.  #2 hypothyroidism TSH elevated at 26.569. Increased Synthroid to 125 MCG's daily. Will need repeat thyroid function studies done in about 4-6 weeks.  #3 cirrhosis/acute hepatic encephalopathy Stable. Spironolactone and Lasix have been held since admission due to dehydration. Patient now only oriented to self. Ammonia level elevated at 110. Due to patient's borderline blood pressure will place patient back on Lasix 40 mg daily. Hold spironolactone for now. Is on lactulose 30 g 3 times a day. Will also start patient on Xifaxan 550 mg twice a day. Saline lock IV fluids. Monitor renal function and blood pressure closely.  #4 T12 compression fracture/rib fractures Patient was seen by interventional radiology to evaluate for VP versus kyphoplasty. MRI was ordered however the Epic notes is noted that patient was too obese to fit in the MRI machine and as such this was not done. Continue pain management. Incentive spirometry. Patient being followed by interventional radiology who are willing fix patient's compression fractures once mentation has improved and no signs of urinary infection. Follow.  #5 recurrent falls PT/OT. Patient needs skilled nursing facility.   #6 morbid obesity    DVT prophylaxis: Lovenox Code Status: Full Family Communication: Updated patient. No family at bedside. Disposition Plan: Skilled nursing facility once pain is better controlled and pending IR recommendations as patient was unable to have the MRI done as it was stated on prior notes that patient was too obese. Also once confusion has resolved.   Consultants:   Interventional radiology 06/13/2017  Procedures:   Chest x-ray 06/12/2017  CT head  06/12/2017  CT chest 06/12/2017  CT C-spine 06/12/2017  CT abdomen and pelvis 06/12/2017  2-D echo 06/14/2017  Antimicrobials:   None   Subjective: Patient  confused and oriented to self only. Patient states she is just not feeling well. Patient denies shortness of breath. No chest pain.  Objective: Vitals:   06/15/17 1700 06/15/17 2203 06/16/17 0641 06/16/17 1456  BP:  (!) 131/49 (!) 127/59 98/63  Pulse:  (!) 56 (!) 57 (!) 54  Resp:  16 18 16   Temp:  97.6 F (36.4 C) 98.4 F (36.9 C) 98.2 F (36.8 C)  TempSrc:  Oral Oral Oral  SpO2: 97% 99% 100%   Weight:   112.4 kg (247 lb 14.4 oz)   Height:       No intake or output data in the 24 hours ending 06/16/17 1824 Filed Weights   06/12/17 1157 06/16/17 0641  Weight: 104.3 kg (230 lb) 112.4 kg (247 lb 14.4 oz)    Examination:  General exam: Confused. Respiratory system: Clear to auscultation anterior lung fields. Respiratory effort normal. Cardiovascular system: S1 & S2 heard, RRR. No JVD, murmurs, rubs, gallops or clicks. No pedal edema. Gastrointestinal system: Abdomen is nondistended, soft and nontender. No organomegaly or masses felt. Normal bowel sounds heard. Central nervous system: Alert and oriented. No focal neurological deficits. Extremities: Right upper extremity in sling. Symmetric 5 x 5 power. Skin: No rashes, lesions or ulcers Psychiatry: Judgement and insight appear poor. Mood & affect flat.     Data Reviewed: I have personally reviewed following labs and imaging studies  CBC:  Recent Labs Lab 06/12/17 1244 06/13/17 0742 06/14/17 0624  WBC 11.0* 7.9 7.1  NEUTROABS 8.4*  --   --   HGB 13.9 11.8* 12.7  HCT 42.1 35.9* 38.4  MCV 100.2* 101.4* 101.3*  PLT 126* 100* 96*   Basic Metabolic Panel:  Recent Labs Lab 06/12/17 1244 06/13/17 0844 06/14/17 0624 06/15/17 0606 06/16/17 0622  NA 140 138 140 141 144  K 4.4 3.7 4.3 4.3 4.5  CL 101 103 106 110 112*  CO2 28 27 28 26 28   GLUCOSE 93 138* 119* 136* 144*  BUN 28* 39* 30* 18 16  CREATININE 1.12* 1.64* 1.32* 0.93 0.90  CALCIUM 9.1 8.6* 8.3* 8.2* 8.3*   GFR: Estimated Creatinine Clearance: 75.2 mL/min  (by C-G formula based on SCr of 0.9 mg/dL). Liver Function Tests:  Recent Labs Lab 06/13/17 0844  AST 70*  ALT 36  ALKPHOS 166*  BILITOT 4.4*  PROT 6.4*  ALBUMIN 2.7*   No results for input(s): LIPASE, AMYLASE in the last 168 hours.  Recent Labs Lab 06/16/17 0756  AMMONIA 110*   Coagulation Profile:  Recent Labs Lab 06/13/17 0844  INR 1.38   Cardiac Enzymes:  Recent Labs Lab 06/12/17 1244  TROPONINI <0.03   BNP (last 3 results) No results for input(s): PROBNP in the last 8760 hours. HbA1C:  Recent Labs  06/14/17 1522  HGBA1C 5.6   CBG:  Recent Labs Lab 06/15/17 1603 06/15/17 2159 06/16/17 0810 06/16/17 1114 06/16/17 1617  GLUCAP 98 149* 125* 89 60*   Lipid Profile: No results for input(s): CHOL, HDL, LDLCALC, TRIG, CHOLHDL, LDLDIRECT in the last 72 hours. Thyroid Function Tests:  Recent Labs  06/16/17 0756  TSH 26.569*   Anemia Panel:  Recent Labs  06/16/17 0756  VITAMINB12 2,211*   Sepsis Labs: No results for input(s): PROCALCITON, LATICACIDVEN in the last 168 hours.  No results found for this or  any previous visit (from the past 240 hour(s)).       Radiology Studies: No results found.      Scheduled Meds: . aspirin  81 mg Oral Daily  . enoxaparin (LOVENOX) injection  40 mg Subcutaneous Q24H  . furosemide  40 mg Oral BID  . insulin aspart  0-9 Units Subcutaneous TID WC  . lactulose  30 g Oral TID  . levothyroxine  100 mcg Oral QAC breakfast  . mouth rinse  15 mL Mouth Rinse BID  . pantoprazole  40 mg Oral Daily  . pravastatin  10 mg Oral q1800  . propranolol  20 mg Oral BID  . rifaximin  550 mg Oral BID  . venlafaxine XR  150 mg Oral BID   Continuous Infusions:   LOS: 3 days    Time spent: 35 minutes    Ahmad Vanwey, MD [facility] Pager [phone]  If 7PM-7AM, please contact night-coverage [email or web address] Password [email or web address] 06/16/2017, 6:24 PM

## 2017-06-16 NOTE — Progress Notes (Signed)
Physical Therapy Treatment Patient Details Name: Nancy Baldwin MRN: [id number] DOB: Feb 09, 1946 Today's Date: 06/16/2017    History of Present Illness 71 year old female with past medical history of diastolic heart failure and cirrhosis and recent severe right humeral fracture this past April who continues to have multiple mechanical falls and presented to the emergency room on 8/2 after she had fallen earlier in the day landing between the bathtub and commode hitting her left shoulder. Patient unable to stand due to excruciating pain on the left side. Found have multiple displaced rib fractures on the left along with an acute compression fracture at T12/L1.     PT Comments    Pt is up to side of bed but resists and cannot remain there.  Her plan is to continue on with mobility toward a discharge to SNF, and hopefully the acute therapy will shorten her stay.  Pt is too lethargic to follow instructions well so will anticipate her decreasing meds will make her follow through in PT more productive.   Follow Up Recommendations  SNF;Supervision for mobility/OOB     Equipment Recommendations  None recommended by PT    Recommendations for Other Services       Precautions / Restrictions Precautions Precautions: Fall;Shoulder;Back;Other (comment) Type of Shoulder Precautions: broken Rt humerus.  Shoulder Interventions: Shoulder sling/immobilizer;At all times;Off for dressing/bathing/exercises Precaution Booklet Issued: No Restrictions Weight Bearing Restrictions: Yes RUE Weight Bearing: Non weight bearing    Mobility  Bed Mobility Overal bed mobility: Needs Assistance Bed Mobility: Supine to Sit;Rolling Rolling: Max assist;+2 for physical assistance;+2 for safety/equipment   Supine to sit: Total assist     General bed mobility comments: Pt was assisted with hips and center of upper back  Transfers                 General transfer comment: Pt was not able to sit up enough to  attempt  Ambulation/Gait             General Gait Details: could not attempt   Stairs            Wheelchair Mobility    Modified Rankin (Stroke Patients Only)       Balance Overall balance assessment: Needs assistance;History of Falls Sitting-balance support: Feet supported Sitting balance-Leahy Scale: Zero                                      Cognition Arousal/Alertness: Lethargic Behavior During Therapy: Flat affect Overall Cognitive Status: No family/caregiver present to determine baseline cognitive functioning                                 General Comments: Pt was resisting PT and nursing to roll or try to sit up bedside      Exercises Donning/doffing sling/immobilizer: Maximal assistance Correct positioning of sling/immobilizer: Maximal assistance    General Comments        Pertinent Vitals/Pain Pain Assessment: Faces Faces Pain Scale: Hurts whole lot Pain Location: Left side ribs and right shoulder pain Pain Intervention(s): Monitored during session;Limited activity within patient's tolerance;Premedicated before session;Repositioned    Home Living                      Prior Function            PT Goals (current goals  can now be found in the care plan section) Acute Rehab PT Goals Patient Stated Goal: To be pain free Progress towards PT goals: Not progressing toward goals - comment    Frequency    Min 2X/week      PT Plan Current plan remains appropriate    Co-evaluation              AM-PAC PT "6 Clicks" Daily Activity  Outcome Measure  Difficulty turning over in bed (including adjusting bedclothes, sheets and blankets)?: Total Difficulty moving from lying on back to sitting on the side of the bed? : Total Difficulty sitting down on and standing up from a chair with arms (e.g., wheelchair, bedside commode, etc,.)?: Total Help needed moving to and from a bed to chair (including a  wheelchair)?: Total Help needed walking in hospital room?: Total Help needed climbing 3-5 steps with a railing? : Total 6 Click Score: 6    End of Session Equipment Utilized During Treatment: Oxygen Activity Tolerance: Patient tolerated treatment well;Patient limited by pain Patient left: in bed;with call bell/phone within reach;with bed alarm set Nurse Communication: Mobility status PT Visit Diagnosis: Unsteadiness on feet (R26.81);History of falling (Z91.81);Difficulty in walking, not elsewhere classified (R26.2);Pain Pain - Right/Left: Right Pain - part of body: Shoulder     Time: 4098-1191 PT Time Calculation (min) (ACUTE ONLY): 38 min  Charges:  $Therapeutic Activity: 23-37 mins $Neuromuscular Re-education: 8-22 mins                    G Codes:  Functional Assessment Tool Used: AM-PAC 6 Clicks Basic Mobility    Ivar Drape 06/16/2017, 4:58 PM   5:00 PM, 06/16/17 Samul Dada, PT, MS Physical Therapist - [place] [phone] (Office)

## 2017-06-16 NOTE — Progress Notes (Signed)
Pt urine sample obtained, peri care performed. Will continue to monitor.

## 2017-06-17 LAB — CBC WITH DIFFERENTIAL/PLATELET
BASOS ABS: 0 10*3/uL (ref 0.0–0.1)
Basophils Relative: 0 %
Eosinophils Absolute: 0.3 10*3/uL (ref 0.0–0.7)
Eosinophils Relative: 4 %
HEMATOCRIT: 41.5 % (ref 36.0–46.0)
Hemoglobin: 13.5 g/dL (ref 12.0–15.0)
LYMPHS ABS: 1.3 10*3/uL (ref 0.7–4.0)
LYMPHS PCT: 17 %
MCH: 33.3 pg (ref 26.0–34.0)
MCHC: 32.5 g/dL (ref 30.0–36.0)
MCV: 102.5 fL — AB (ref 78.0–100.0)
MONO ABS: 1.4 10*3/uL — AB (ref 0.1–1.0)
Monocytes Relative: 18 %
NEUTROS ABS: 4.8 10*3/uL (ref 1.7–7.7)
Neutrophils Relative %: 61 %
Platelets: 106 10*3/uL — ABNORMAL LOW (ref 150–400)
RBC: 4.05 MIL/uL (ref 3.87–5.11)
RDW: 15.8 % — ABNORMAL HIGH (ref 11.5–15.5)
WBC: 7.9 10*3/uL (ref 4.0–10.5)

## 2017-06-17 LAB — COMPREHENSIVE METABOLIC PANEL
ALT: 37 U/L (ref 14–54)
AST: 59 U/L — AB (ref 15–41)
Albumin: 2.3 g/dL — ABNORMAL LOW (ref 3.5–5.0)
Alkaline Phosphatase: 171 U/L — ABNORMAL HIGH (ref 38–126)
Anion gap: 8 (ref 5–15)
BILIRUBIN TOTAL: 5.3 mg/dL — AB (ref 0.3–1.2)
BUN: 18 mg/dL (ref 6–20)
CO2: 25 mmol/L (ref 22–32)
CREATININE: 0.85 mg/dL (ref 0.44–1.00)
Calcium: 8.5 mg/dL — ABNORMAL LOW (ref 8.9–10.3)
Chloride: 108 mmol/L (ref 101–111)
GFR calc Af Amer: >60 mL/min (ref >60)
Glucose, Bld: 109 mg/dL — ABNORMAL HIGH (ref 65–99)
POTASSIUM: 4.2 mmol/L (ref 3.5–5.1)
Sodium: 141 mmol/L (ref 135–145)
TOTAL PROTEIN: 5.9 g/dL — AB (ref 6.5–8.1)

## 2017-06-17 LAB — FOLATE RBC
FOLATE, RBC: 1412 ng/mL (ref >498)
Folate, Hemolysate: 494.3 ng/mL
HEMATOCRIT: 35 % (ref 34.0–46.6)

## 2017-06-17 LAB — RPR: RPR: NONREACTIVE

## 2017-06-17 LAB — GLUCOSE, CAPILLARY
GLUCOSE-CAPILLARY: 96 mg/dL (ref 65–99)
GLUCOSE-CAPILLARY: 139 mg/dL — AB (ref 65–99)
Glucose-Capillary: 79 mg/dL (ref 65–99)
Glucose-Capillary: 107 mg/dL — ABNORMAL HIGH (ref 65–99)

## 2017-06-17 LAB — MAGNESIUM: MAGNESIUM: 1.6 mg/dL — AB (ref 1.7–2.4)

## 2017-06-17 LAB — HIV ANTIBODY (ROUTINE TESTING W REFLEX): HIV Screen 4th Generation wRfx: NONREACTIVE

## 2017-06-17 LAB — AMMONIA: Ammonia: 22 umol/L (ref 9–35)

## 2017-06-17 MED ORDER — LACTULOSE 10 GM/15ML PO SOLN
30.0000 g | Freq: Three times a day (TID) | ORAL | Status: DC
  Administered 2017-06-17 – 2017-06-19 (×5): 30 g via ORAL
  Filled 2017-06-17 (×4): qty 60

## 2017-06-17 MED ORDER — MAGNESIUM SULFATE 4 GM/100ML IV SOLN
4.0000 g | Freq: Once | INTRAVENOUS | Status: AC
  Administered 2017-06-17: 4 g via INTRAVENOUS
  Filled 2017-06-17: qty 100

## 2017-06-17 MED ORDER — LACTULOSE ENEMA
300.0000 mL | Freq: Two times a day (BID) | RECTAL | Status: DC
Start: 2017-06-17 — End: 2017-06-17
  Administered 2017-06-17: 300 mL via RECTAL
  Filled 2017-06-17 (×3): qty 300

## 2017-06-17 MED ORDER — SPIRONOLACTONE 25 MG PO TABS
12.5000 mg | ORAL_TABLET | Freq: Two times a day (BID) | ORAL | Status: DC
  Administered 2017-06-17 – 2017-06-19 (×4): 12.5 mg via ORAL
  Filled 2017-06-17 (×4): qty 1

## 2017-06-17 NOTE — Clinical Social Work Placement (Signed)
   CLINICAL SOCIAL WORK PLACEMENT  NOTE  Date:  06/17/2017  Patient Details  Name: Celesta GentileKacie S Eley MRN: [id number] Date of Birth: 1946/05/31  Clinical Social Work is seeking post-discharge placement for this patient at the Skilled  Nursing Facility level of care (*CSW will initial, date and re-position this form in  chart as items are completed):  Yes   Patient/family provided with [facility] Clinical Social Work Department's list of facilities offering this level of care within the geographic area requested by the patient (or if unable, by the patient's family).  Yes   Patient/family informed of their freedom to choose among providers that offer the needed level of care, that participate in Medicare, Medicaid or managed care program needed by the patient, have an available bed and are willing to accept the patient.  Yes   Patient/family informed of 's ownership interest in [facility] and [facility], as well as of the fact that they are under no obligation to receive care at these facilities.  PASRR submitted to EDS on       PASRR number received on       Existing PASRR number confirmed on 06/17/17     FL2 transmitted to all facilities in geographic area requested by pt/family on 06/17/17     FL2 transmitted to all facilities within larger geographic area on       Patient informed that his/her managed care company has contracts with or will negotiate with certain facilities, including the following:            Patient/family informed of bed offers received.  Patient chooses bed at       Physician recommends and patient chooses bed at      Patient to be transferred to   on  .  Patient to be transferred to facility by       Patient family notified on   of transfer.  Name of family member notified:        PHYSICIAN       Additional Comment:    _______________________________________________ Annice NeedySettle, Chane Cowden D, LCSW 06/17/2017, 3:04 PM

## 2017-06-17 NOTE — Evaluation (Signed)
Clinical/Bedside Swallow Evaluation Patient Details  Name: Nancy Baldwin MRN: [id number] Date of Birth: [birth year]  Today's Date: 06/17/2017 Time: SLP Start Time (ACUTE ONLY): 1840 SLP Stop Time (ACUTE ONLY): 1910 SLP Time Calculation (min) (ACUTE ONLY): 30 min  Past Medical History:  Past Medical History:  Diagnosis Date  . Anxiety   . CHF (congestive heart failure) (HCC)   . Depression   . Hypertension   . Lung cancer [facility](HCC)    Past Surgical History:  Past Surgical History:  Procedure Laterality Date  . ABDOMINAL HYSTERECTOMY    . TONSILLECTOMY AND ADENOIDECTOMY     HPI:  71 year old female with past medical history of diastolic heart failure and cirrhosis and recent severe right humeral fracture this past April who continues to have multiple mechanical falls and presented to the emergency room on 8/2 after she had fallen earlier in the day landing between the bathtub and commode hitting her left shoulder. Patient unable to stand due to excruciating pain on the left side. Found have multiple displaced rib fractures on the left along with an acute compression fracture at T12/L1. Patient continues to have severe pain on that left side. Interventional radiology consulted for evaluation of compression fracture. Patient complains of hurting all over, mostly on that left side below the axilla. Patient on the evening of 06/15/2017 noted to be confused and alert to self only. Ammonia level obtained elevated at 110. Patient likely with hepatic encephalopathy. Patient's Lasix and spironolactone initially held during the hospitalization. Lasix being resumed at 40 mg daily. Patient will be started on lactulose 30 g 3 times a day as well as Xifaxan. Pt pocketing pills this AM and BSE ordered.   Assessment / Plan / Recommendation Clinical Impression  Clinical swallow evaluation completed at bedside with husband present. Pt initially received reclined in bed self feeding nuts/seeds mixture. She required  extra time to clear oral cavity and benefited from liquid wash. Oral motor examination unremarkable except for some missing dentition. Pt alert and engaged, however exhibited moments of confusion. Her husband reported that he has noticed this recently with this admission. Suspect cognitive based dysphagia characterized by prolonged oral prep and some pocketing. Pt benefits from liquid wash and verbal cues to clear. Aspiration and reflux precautions were reviewed with Pt and spouse, specifically related to current bed-bound status, difficulty with self feeding due to injuries, and pain when generating protective cough. Pt encouraged to sit as upright as possible and remain upright for 30 minutes post meals. OK to continue diet as ordered and SLP will follow x1 for diet tolerance and education as needed. Only offered PO when Pt is alert and upright.  SLP Visit Diagnosis: Dysphagia, oropharyngeal phase (R13.12)    Aspiration Risk  Mild aspiration risk    Diet Recommendation Regular;Thin liquid   Liquid Administration via: Cup;Straw Medication Administration: Whole meds with puree Supervision: Full supervision/cueing for compensatory strategies Compensations: Slow rate;Small sips/bites Postural Changes: Seated upright at 90 degrees;Remain upright for at least 30 minutes after po intake    Other  Recommendations Oral Care Recommendations: Staff/trained caregiver to provide oral care;Oral care BID Other Recommendations: Clarify dietary restrictions   Follow up Recommendations None      Frequency and Duration min 2x/week  1 week       Prognosis Prognosis for Safe Diet Advancement: Good      Swallow Study   General Date of Onset: 06/12/17 HPI: 71 year old female with past medical history of diastolic heart failure and cirrhosis and  recent severe right humeral fracture this past April who continues to have multiple mechanical falls and presented to the emergency room on 8/2 after she had fallen  earlier in the day landing between the bathtub and commode hitting her left shoulder. Patient unable to stand due to excruciating pain on the left side. Found have multiple displaced rib fractures on the left along with an acute compression fracture at T12/L1. Patient continues to have severe pain on that left side. Interventional radiology consulted for evaluation of compression fracture. Patient complains of hurting all over, mostly on that left side below the axilla. Patient on the evening of 06/15/2017 noted to be confused and alert to self only. Ammonia level obtained elevated at 110. Patient likely with hepatic encephalopathy. Patient's Lasix and spironolactone initially held during the hospitalization. Lasix being resumed at 40 mg daily. Patient will be started on lactulose 30 g 3 times a day as well as Xifaxan. Pt pocketing pills this AM and BSE ordered. Type of Study: Bedside Swallow Evaluation Previous Swallow Assessment: None on record Diet Prior to this Study: Regular;Thin liquids Temperature Spikes Noted: No Respiratory Status: Nasal cannula History of Recent Intubation: No Behavior/Cognition: Alert;Cooperative;Pleasant mood;Confused (intermittant confusion) Oral Cavity Assessment: Within Functional Limits Oral Care Completed by SLP: No Oral Cavity - Dentition: Missing dentition Vision: Functional for self-feeding Self-Feeding Abilities: Able to feed self;Needs set up Patient Positioning: Upright in bed Baseline Vocal Quality: Normal Volitional Cough: Strong Volitional Swallow: Able to elicit    Oral/Motor/Sensory Function Overall Oral Motor/Sensory Function: Within functional limits   Ice Chips Ice chips: Not tested   Thin Liquid Thin Liquid: Within functional limits Presentation: Cup;Straw;Self Fed    Nectar Thick Nectar Thick Liquid: Not tested   Honey Thick Honey Thick Liquid: Not tested   Puree Puree: Within functional limits Presentation: Spoon   Solid   Thank  you,  Nancy Baldwin, CCC-SLP [phone]    Solid: Impaired Presentation: Self Fed Oral Phase Functional Implications: Prolonged oral transit;Oral residue        Basel Defalco 06/17/2017,7:19 PM

## 2017-06-17 NOTE — Care Management Note (Signed)
Case Management Note  Patient Details  Name: Nancy Baldwin MRN: [id number] Date of Birth: 1946-04-08  If discussed at Long Length of Stay Meetings, dates discussed:  06/17/2017   Malcolm Metrohildress, Tarissa Kerin Demske, RN 06/17/2017, 10:16 AM

## 2017-06-17 NOTE — Care Management Note (Signed)
Case Management Note  Patient Details  Name: Nancy Baldwin MRN: [id number] Date of Birth: June 16, 1946  Expected Discharge Date:       06/19/2017           Expected Discharge Plan:  Skilled Nursing Facility  In-House Referral:  Clinical Social Work  Discharge planning Services  CM Consult  Post Acute Care Choice:  NA Choice offered to:  NA  Status of Service:  Completed, signed off    Additional Comments: PT has recommended SNF, pt agreeable and CSW has made arrangements for placement. [facility] rep, aware of DC plan.   Malcolm Metrohildress, Keymani Mclean Demske, RN 06/17/2017, 10:16 AM

## 2017-06-17 NOTE — [place] FL2 (Signed)
[place] MEDICAID FL2 LEVEL OF CARE SCREENING TOOL     IDENTIFICATION  Patient Name: Nancy Baldwin Birthdate: [birth year] Sex: female Admission Date (Current Location): 06/12/2017  [facility] and [place] Number:  [employer] and Address:  [facility],  [address] [place] [address]      Provider Number: [id number]  Attending Physician Name and Address:  Rodolph Bong, MD  Relative Name and Phone Number:       Current Level of Care: Hospital Recommended Level of Care: Skilled Nursing Facility Prior Approval Number:    Date Approved/Denied:   PASRR Number: [id number] ([id number] )  Discharge Plan: SNF    Current Diagnoses: Patient Active Problem List   Diagnosis Date Noted  . Spinal compression fracture, initial encounter (HCC)   . Acute hepatic encephalopathy   . Multiple closed fractures of ribs of left side   . T12 compression fracture (HCC) 06/13/2017  . 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 Acute diastolic CHF (congestive heart failure) (HCC) 06/13/2017  . Intractable pain 06/12/2017  . Diastolic CHF (HCC) 02/07/2014  . Pneumonia 02/07/2014  . Cirrhosis (HCC) 02/07/2014  . Hypothyroidism 02/07/2014  . Anxiety state, unspecified 02/07/2014  . Depression 02/07/2014    Orientation RESPIRATION BLADDER Height & Weight     Place  Normal Incontinent Weight: 248 lb 6.4 oz (112.7 kg) Height:  5\' 7"  (170.2 cm)  BEHAVIORAL SYMPTOMS/MOOD NEUROLOGICAL BOWEL NUTRITION STATUS      Continent Diet (See discharge summary)  AMBULATORY STATUS COMMUNICATION OF NEEDS Skin   Extensive Assist Verbally Normal                       Personal Care Assistance Level of Assistance  Bathing, Feeding, Dressing Bathing Assistance: Maximum assistance Feeding assistance: Limited assistance Dressing Assistance: Maximum assistance     Functional Limitations Info  Hearing, Speech, Sight Sight Info:  Adequate Hearing Info: Adequate Speech Info: Adequate    SPECIAL CARE FACTORS FREQUENCY  PT (By licensed PT), OT (By licensed OT)     PT Frequency: 5x/week OT Frequency: 3x/week            Contractures Contractures Info: Not present    Additional Factors Info  Code Status, Allergies, Psychotropic Code Status Info: Full Code Allergies Info: Amoxicillin, Codeine, Decongest-aid, Decongestant, Metformin and related, Tricor Psychotropic Info: Effexor XR         Current Medications (06/17/2017):  This is the current hospital active medication list Current Facility-Administered Medications  Medication Dose Route Frequency Provider Last Rate Last Dose  . acetaminophen (TYLENOL) tablet 650 mg  650 mg Oral Q6H PRN Arrien, York Ram, MD       Or  . acetaminophen (TYLENOL) suppository 650 mg  650 mg Rectal Q6H PRN Arrien, York Ram, MD      . aspirin chewable tablet 81 mg  81 mg Oral Daily Coralie Keens, MD   81 mg at 06/17/17 0272  . enoxaparin (LOVENOX) injection 40 mg  40 mg Subcutaneous Q24H Coralie Keens, MD   40 mg at 06/16/17 2304  . furosemide (LASIX) tablet 40 mg  40 mg Oral BID Rodolph Bong, MD   40 mg at 06/17/17 5366  . insulin aspart (novoLOG) injection 0-9 Units  0-9 Units Subcutaneous TID WC Arrien, York Ram, MD   1 Units at 06/16/17 [phone]  . lactulose (CHRONULAC) enema 200 gm  300 mL Rectal BID Janee Morn,  Lovey Newcomeraniel V, MD   300 mL at 06/17/17 0934  . levothyroxine (SYNTHROID, LEVOTHROID) tablet 125 mcg  125 mcg Oral QAC breakfast Rodolph Bonghompson, Daniel V, MD   125 mcg at 06/17/17 [phone]  . MEDLINE mouth rinse  15 mL Mouth Rinse BID Arrien, York RamMauricio Daniel, MD   15 mL at 06/17/17 1000  . morphine 2 MG/ML injection 1 mg  1 mg Intravenous Q2H PRN Arrien, York RamMauricio Daniel, MD      . ondansetron [facility](ZOFRAN) tablet 4 mg  4 mg Oral Q6H PRN Arrien, York RamMauricio Daniel, MD       Or  . ondansetron [facility](ZOFRAN) injection 4 mg  4 mg Intravenous Q6H PRN Arrien,  York RamMauricio Daniel, MD      . pantoprazole (PROTONIX) EC tablet 40 mg  40 mg Oral Daily Coralie KeensArrien, Mauricio Daniel, MD   40 mg at 06/17/17 [phone]  . pravastatin (PRAVACHOL) tablet 10 mg  10 mg Oral q1800 Arrien, York RamMauricio Daniel, MD   10 mg at 06/16/17 1755  . propranolol (INDERAL) tablet 20 mg  20 mg Oral BID Coralie KeensArrien, Mauricio Daniel, MD   20 mg at 06/17/17 [phone]  . rifaximin (XIFAXAN) tablet 550 mg  550 mg Oral BID Rodolph Bonghompson, Daniel V, MD   550 mg at 06/16/17 2304  . traMADol (ULTRAM) tablet 50-100 mg  50-100 mg Oral Q6H PRN Rodolph Bonghompson, Daniel V, MD   50 mg at 06/15/17 1753  . venlafaxine XR (EFFEXOR-XR) 24 hr capsule 150 mg  150 mg Oral BID Arrien, York RamMauricio Daniel, MD   150 mg at 06/16/17 2305     Discharge Medications: Please see discharge summary for a list of discharge medications.  Relevant Imaging Results:  Relevant Lab Results:   Additional Information SSN [id number] (SSN is in the PASARR system as [phone]).   Nyasha Rahilly, Juleen ChinaHeather D, LCSW

## 2017-06-17 NOTE — Progress Notes (Signed)
PROGRESS NOTE    Nancy Baldwin  ZOX:[id number] DOB: 02-06-1946 DOA: 06/12/2017 PCP: Ignatius Specking, MD   Brief Narrative:  71 year old female with past medical history of diastolic heart failure and cirrhosis and recent severe right humeral fracture this past April who continues to have multiple mechanical falls and presented to the emergency room on 8/2 after she had fallen earlier in the day landing between the bathtub and commode hitting her left shoulder. Patient unable to stand due to excruciating pain on the left side. Found have multiple displaced rib fractures on the left along with an acute compression fracture at T12/L1.   Patient continues to have severe pain on that left side. Interventional radiology consulted for evaluation of compression fracture. Patient complains of hurting all over, mostly on that left side below the axilla.   Patient on the evening of 06/15/2017 noted to be confused and alert to self only. Ammonia level obtained elevated at 110. Patient likely with hepatic encephalopathy. Patient's Lasix and spironolactone initially held during the hospitalization. Lasix being resumed at 40 mg daily. Patient will be started on lactulose 30 g 3 times a day as well as Xifaxan.    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 I/O's not correctly recorded.  Patient with clinical improvement. 2-D echo with a EF of 60-65%, no wall motion abnormalities, grade 2 diastolic dysfunction, mild to moderate aortic valve stenosis. Continue aspirin,  propranolol, Pravachol. Resume Lasix at 40 mg daily. Outpatient follow-up.  #2 hypothyroidism TSH elevated at 26.569. Increased Synthroid to 125 MCG's daily. Will need repeat thyroid function studies done in about 4-6 weeks.  #3 cirrhosis/acute hepatic encephalopathy Patient confused have a little more alert than yesterday. Spironolactone and Lasix have been held since admission due to dehydration. Patient now only oriented to self. Ammonia level elevated at 110 on 06/16/2017 now trending down. Blood pressure improvement. Change Lasix to 40 mg twice daily. Resume home dose spironolactone. Due to patient's lethargy will place on lactulose enemas. Continue Xifaxan.   #4 T12 compression fracture/rib fractures Patient was seen by interventional radiology to evaluate for VP versus kyphoplasty. MRI was ordered however the Epic notes is noted that patient was too obese to fit in the MRI machine and as such this was not done. Continue pain management. Incentive spirometry. Patient being followed by interventional radiology who are willing fix patient's compression fractures once mentation has improved and no signs of urinary infection. Follow.  #5 recurrent falls PT/OT. Patient needs skilled nursing facility.   #6 morbid obesity    DVT prophylaxis: Lovenox Code Status: Full Family Communication: Updated patient. No family at bedside. Disposition Plan: Skilled nursing facility once pain is better controlled and pending IR recommendations as patient was unable to have the MRI done as it was stated on prior notes that patient was too obese. Also once confusion has resolved.   Consultants:   Interventional radiology 06/13/2017  Procedures:   Chest x-ray 06/12/2017  CT head 06/12/2017  CT chest 06/12/2017  CT C-spine 06/12/2017  CT abdomen and pelvis 06/12/2017  2-D echo 06/14/2017  Antimicrobials:   None   Subjective: Patient confused and oriented to self and knows she is in the  hospital. Slightly more alert than yesterday. Patient denies shortness of breath. No chest pain.  Objective: Vitals:   06/16/17 1456 06/16/17 2100 06/17/17 0500 06/17/17 1435  BP: 98/63 (!) 130/55 (!) 105/54 (!) 143/53  Pulse: (!) 54 (!) 58 (!) 58 (!) 50  Resp: 16 16 16 16   Temp: 98.2 F (36.8 C)  98.4 F (36.9 C) 97.8 F (36.6 C)  TempSrc: Oral  Axillary Oral  SpO2:  95% 95% 91%  Weight:   112.7 kg (248 lb 6.4 oz)   Height:        Intake/Output Summary (Last 24 hours) at 06/17/17 1733 Last data filed at 06/17/17 0900  Gross per 24 hour  Intake                0 ml  Output                0 ml  Net                0 ml   Filed Weights   06/12/17 1157 06/16/17 0641 06/17/17 0500  Weight: 104.3 kg (230 lb) 112.4 kg (247 lb 14.4 oz) 112.7 kg (248 lb 6.4 oz)    Examination:  General exam: Confused. Respiratory system: Clear to auscultation anterior lung fields. Respiratory effort normal. Cardiovascular system: S1 & S2 heard, RRR. No JVD, murmurs, rubs, gallops or clicks. No pedal edema. Gastrointestinal system: Abdomen is nondistended, soft and nontender. No organomegaly or masses felt. Normal bowel sounds heard. Central nervous system: Alert and oriented. No focal neurological deficits. Extremities: Right upper extremity out of sling. Symmetric 5 x 5 power. Skin: No rashes, lesions or ulcers Psychiatry: Judgement and insight appear poor. Mood & affect flat.     Data Reviewed: I have personally reviewed following labs and imaging studies  CBC:  Recent Labs Lab 06/12/17 1244 06/13/17 0742 06/14/17 0624 06/16/17 0756 06/17/17 0517  WBC 11.0* 7.9 7.1  --  7.9  NEUTROABS 8.4*  --   --   --  4.8  HGB 13.9 11.8* 12.7  --  13.5  HCT 42.1 35.9* 38.4 35.0 41.5  MCV 100.2* 101.4* 101.3*  --  102.5*  PLT 126* 100* 96*  --  106*   Basic Metabolic Panel:  Recent Labs Lab 06/13/17 0844 06/14/17 0624 06/15/17 0606 06/16/17 0622 06/17/17 0517  NA 138 140 141 144 141  K  3.7 4.3 4.3 4.5 4.2  CL 103 106 110 112* 108  CO2 27 28 26 28 25   GLUCOSE 138* 119* 136* 144* 109*  BUN 39* 30* 18 16 18   CREATININE 1.64* 1.32* 0.93 0.90 0.85  CALCIUM 8.6* 8.3* 8.2* 8.3* 8.5*  MG  --   --   --   --  1.6*   GFR: Estimated Creatinine Clearance: 79.7 mL/min (by C-G formula based on SCr of 0.85 mg/dL). Liver Function Tests:  Recent Labs Lab 06/13/17 0844 06/17/17 0517  AST 70* 59*  ALT 36 37  ALKPHOS 166* 171*  BILITOT 4.4* 5.3*  PROT 6.4* 5.9*  ALBUMIN 2.7* 2.3*   No results for input(s): LIPASE, AMYLASE in the last 168 hours.  Recent Labs Lab 06/16/17 0756 06/17/17 0831  AMMONIA 110* 22   Coagulation Profile:  Recent Labs Lab 06/13/17 0844  INR 1.38   Cardiac Enzymes:  Recent Labs Lab 06/12/17 1244  TROPONINI <  0.03   BNP (last 3 results) No results for input(s): PROBNP in the last 8760 hours. HbA1C: No results for input(s): HGBA1C in the last 72 hours. CBG:  Recent Labs Lab 06/16/17 1905 06/16/17 2052 06/17/17 0740 06/17/17 1125 06/17/17 1645  GLUCAP 132* 101* 79 107* 96   Lipid Profile: No results for input(s): CHOL, HDL, LDLCALC, TRIG, CHOLHDL, LDLDIRECT in the last 72 hours. Thyroid Function Tests:  Recent Labs  06/16/17 0756  TSH 26.569*   Anemia Panel:  Recent Labs  06/16/17 0756  VITAMINB12 2,211*   Sepsis Labs: No results for input(s): PROCALCITON, LATICACIDVEN in the last 168 hours.  No results found for this or any previous visit (from the past 240 hour(s)).       Radiology Studies: No results found.      Scheduled Meds: . aspirin  81 mg Oral Daily  . enoxaparin (LOVENOX) injection  40 mg Subcutaneous Q24H  . furosemide  40 mg Oral BID  . insulin aspart  0-9 Units Subcutaneous TID WC  . lactulose  300 mL Rectal BID  . levothyroxine  125 mcg Oral QAC breakfast  . mouth rinse  15 mL Mouth Rinse BID  . pantoprazole  40 mg Oral Daily  . pravastatin  10 mg Oral q1800  . propranolol  20 mg Oral  BID  . rifaximin  550 mg Oral BID  . spironolactone  12.5 mg Oral BID  . venlafaxine XR  150 mg Oral BID   Continuous Infusions:   LOS: 4 days    Time spent: 35 minutes    THOMPSON,DANIEL, MD [facility] Pager [phone]  If 7PM-7AM, please contact night-coverage [email or web address] Password [email or web address] 06/17/2017, 5:33 PM

## 2017-06-17 NOTE — Progress Notes (Signed)
Pt having increased lethargy this morning. When trying to give morning medicines, pt would just hold them in her mouth and not want to swallow. Even tried crushing meds and putting in applesauce, that also was unsuccessful. MD made aware. Lactulose enema ordered, and SLP evaluation as well. Will continue to monitor.

## 2017-06-17 NOTE — Progress Notes (Signed)
Inpatient Diabetes Program Recommendations  AACE/ADA: New Consensus Statement on Inpatient Glycemic Control (2015)  Target Ranges:  Prepandial:   less than 140 mg/dL      Peak postprandial:   less than 180 mg/dL (1-2 hours)      Critically ill patients:  140 - 180 mg/dL  Results for Nancy Baldwin, [name] (MRN [id number]) as of 06/17/2017 11:17  Ref. Range 06/16/2017 08:10 06/16/2017 11:14 06/16/2017 16:17 06/16/2017 19:05 06/16/2017 20:52 06/17/2017 07:40  Glucose-Capillary Latest Ref Range: 65 - 99 mg/dL 401125 (H) 89 60 (L) 027132 (H) 101 (H) 79  Results for Nancy Baldwin, [name] (MRN [id number]) as of 06/17/2017 11:17  Ref. Range 06/15/2017 07:27 06/15/2017 11:08 06/15/2017 16:03 06/15/2017 21:59  Glucose-Capillary Latest Ref Range: 65 - 99 mg/dL 78 474219 (H) 98 259149 (H)  Results for Nancy Baldwin, [name] (MRN [id number]) as of 06/17/2017 11:17  Ref. Range 06/14/2017 08:09 06/14/2017 11:30 06/14/2017 16:39 06/14/2017 21:44  Glucose-Capillary Latest Ref Range: 65 - 99 mg/dL 329139 (H) 518182 (H) 71 841190 (H)   Results for Nancy Baldwin, [name] (MRN [id number]) as of 06/17/2017 11:17  Ref. Range 06/14/2017 15:22  Hemoglobin A1C Latest Ref Range: 4.8 - 5.6 % 5.6   Review of Glycemic Control  Diabetes history: DM2 Outpatient Diabetes medications: Lantus 52 units QAM, Humalog 12 units BID Current orders for Inpatient glycemic control: Novolog 0-9 units TID with meals  Inpatient Diabetes Program Recommendations:  Outpatient DM medication regimen: Per home medication list, patient is taking Lantus and Humalog as an outpatient. Patient has only received a minimal amount of Novolog correction since being admitted and has experienced hypoglycemia.  At time of discharge, please consider re-evaluating DM medications.  A1C: A1C 5.6% on 06/14/17 indicating an average glucose of 114 mg/dl over the past 2-3 months.  Thanks, Orlando PennerMarie Elster Corbello, RN, MSN, CDE Diabetes Coordinator Inpatient Diabetes Program [phone] (Team Pager from 8am to 5pm)

## 2017-06-17 NOTE — Clinical Social Work Note (Signed)
Clinical Social Work Assessment  Patient Details  Name: Nancy Baldwin MRN: [id number] Date of Birth: 01-Apr-1946  Date of referral:  06/17/17               Reason for consult:  Discharge Planning                Permission sought to share information with:    Permission granted to share information::     Name::        Agency::     Relationship::     Contact Information:  Mr. Colbert EwingMeeks, husband   Housing/Transportation Living arrangements for the past 2 months:  Single Family Home Source of Information:  Spouse Patient Interpreter Needed:  None Criminal Activity/Legal Involvement Pertinent to Current Situation/Hospitalization:  No - Comment as needed Significant Relationships:  Adult Children, Spouse Lives with:  Spouse Do you feel safe going back to the place where you live?  Yes Need for family participation in patient care:  Yes (Comment)  Care giving concerns:  None identified.    Social Worker assessment / plan:  Mr. Colbert EwingMeeks, spouse, advised that at baseline patient is "up and around" on a daily basis. He stated that she typically ambulates independently, however when she is not feeling well she will use her walker.  Patient is also independent in her ADLs at baseline (prior to breaking her arm).  Mr. Colbert EwingMeeks was agreeable to SNF. [facility]   Employment status:  Retired Database administratornsurance information:  Managed Medicare PT Recommendations:  Skilled Nursing Facility Information / Referral to community resources:  Skilled Nursing Facility  Patient/Family's Response to care:  Spouse is agreeable to SNF.   Patient/Family's Understanding of and Emotional Response to Diagnosis, Current Treatment, and Prognosis:  Family is accepting of patient's diagnosis, treatment and prognosis and agree that short term SNF is the most appropriate discharge plan.   Emotional Assessment Appearance:    Attitude/Demeanor/Rapport:    Affect (typically observed):   Unable to Assess Orientation:  Oriented to Self, Oriented to Place Alcohol / Substance use:  Not Applicable Psych involvement (Current and /or in the community):  No (Comment)  Discharge Needs  Concerns to be addressed:  Discharge Planning Concerns Readmission within the last 30 days:  No Current discharge risk:  None Barriers to Discharge:  Insurance Authorization   Annice NeedySettle, Emmilyn Crooke D, LCSW 06/17/2017, 3:05 PM

## 2017-06-18 DIAGNOSIS — I5031 Acute diastolic (congestive) heart failure: Secondary | ICD-10-CM

## 2017-06-18 DIAGNOSIS — E1169 Type 2 diabetes mellitus with other specified complication: Secondary | ICD-10-CM | POA: Diagnosis present

## 2017-06-18 DIAGNOSIS — S2242XA Multiple fractures of ribs, left side, initial encounter for closed fracture: Secondary | ICD-10-CM

## 2017-06-18 DIAGNOSIS — K7200 Acute and subacute hepatic failure without coma: Secondary | ICD-10-CM

## 2017-06-18 DIAGNOSIS — D696 Thrombocytopenia, unspecified: Secondary | ICD-10-CM | POA: Diagnosis present

## 2017-06-18 LAB — BASIC METABOLIC PANEL
Anion gap: 8 (ref 5–15)
BUN: 22 mg/dL — ABNORMAL HIGH (ref 6–20)
CALCIUM: 8.5 mg/dL — AB (ref 8.9–10.3)
CO2: 28 mmol/L (ref 22–32)
CREATININE: 1.08 mg/dL — AB (ref 0.44–1.00)
Chloride: 103 mmol/L (ref 101–111)
GFR, EST AFRICAN AMERICAN: 59 mL/min — AB (ref >60)
GFR, EST NON AFRICAN AMERICAN: 51 mL/min — AB (ref >60)
Glucose, Bld: 95 mg/dL (ref 65–99)
Potassium: 3.9 mmol/L (ref 3.5–5.1)
SODIUM: 139 mmol/L (ref 135–145)

## 2017-06-18 LAB — URINALYSIS, ROUTINE W REFLEX MICROSCOPIC
BILIRUBIN URINE: NEGATIVE
Glucose, UA: NEGATIVE mg/dL
HGB URINE DIPSTICK: NEGATIVE
Ketones, ur: NEGATIVE mg/dL
Leukocytes, UA: NEGATIVE
Nitrite: NEGATIVE
PROTEIN: NEGATIVE mg/dL
Specific Gravity, Urine: 1.012 (ref 1.005–1.030)
pH: 5 (ref 5.0–8.0)

## 2017-06-18 LAB — URINE CULTURE

## 2017-06-18 LAB — GLUCOSE, CAPILLARY
GLUCOSE-CAPILLARY: 166 mg/dL — AB (ref 65–99)
GLUCOSE-CAPILLARY: 129 mg/dL — AB (ref 65–99)
Glucose-Capillary: 104 mg/dL — ABNORMAL HIGH (ref 65–99)
Glucose-Capillary: 136 mg/dL — ABNORMAL HIGH (ref 65–99)

## 2017-06-18 LAB — CBC WITH DIFFERENTIAL/PLATELET
BASOS PCT: 0 %
Basophils Absolute: 0 10*3/uL (ref 0.0–0.1)
EOS ABS: 0.2 10*3/uL (ref 0.0–0.7)
Eosinophils Relative: 2 %
HCT: 39 % (ref 36.0–46.0)
HEMOGLOBIN: 13 g/dL (ref 12.0–15.0)
Lymphocytes Relative: 23 %
Lymphs Abs: 1.7 10*3/uL (ref 0.7–4.0)
MCH: 33.9 pg (ref 26.0–34.0)
MCHC: 33.3 g/dL (ref 30.0–36.0)
MCV: 101.6 fL — ABNORMAL HIGH (ref 78.0–100.0)
Monocytes Absolute: 1.2 10*3/uL — ABNORMAL HIGH (ref 0.1–1.0)
Monocytes Relative: 16 %
NEUTROS PCT: 58 %
Neutro Abs: 4.3 10*3/uL (ref 1.7–7.7)
PLATELETS: 106 10*3/uL — AB (ref 150–400)
RBC: 3.84 MIL/uL — AB (ref 3.87–5.11)
RDW: 15.8 % — ABNORMAL HIGH (ref 11.5–15.5)
WBC: 7.4 10*3/uL (ref 4.0–10.5)

## 2017-06-18 LAB — MAGNESIUM: MAGNESIUM: 2 mg/dL (ref 1.7–2.4)

## 2017-06-18 NOTE — Progress Notes (Signed)
PROGRESS NOTE    Nancy Baldwin  ZOX:[id number] DOB: 1946-02-18 DOA: 06/12/2017 PCP: Ignatius Specking, MD   Brief Narrative:  71 year old female with past medical history of diastolic heart failure and cirrhosis and recent severe right humeral fracture this past April who continues to have multiple mechanical falls and presented to the emergency room on 8/2 after she had fallen earlier in the day landing between the bathtub and commode hitting her left shoulder. Patient was unable to stand due to excruciating pain on the left side. Found have multiple displaced rib fractures on the left along with an acute compression fracture at T12/L1.   Patient continues to have severe pain on that left side. Interventional radiology consulted for evaluation of compression fracture. Patient complained of hurting all over, mostly on that left side below the axilla.   Patient on the evening of 06/15/2017 noted to be confused and alert to self only. Ammonia level obtained elevated at 110. Patient likely with hepatic encephalopathy. Patient's Lasix and spironolactone initially held during the hospitalization. Lasix being resumed at 40 mg daily. Patient will be started on lactulose 30 g 3 times a day as well as Xifaxan.    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 There was no evidence of pneumothorax radiographically. She was started on analgesics as needed for pain. Interventional radiology evaluated the patient for VP versus  kyphoplasty. MRI was ordered however the patient was too obese to fit in the MRI machine. Patient also developed acute on chronic diastolic heart failure. Lasix was started. Patient on the evening of 06/15/2017 was noted to be confused and alert to self only. Ammonia level obtained and was elevated at 110. Patient likely developed hepatic encephalopathy. Patient's Lasix and spironolactone were initially held during the hospitalization. Both were eventually resumed. Patient was restarted on lactulose 30 g 3 times a day as well as Xifaxan. Her ammonia level improved and her encephalopathy has resolved. She was noted to have an elevated TSH of 26.569. Therefore, Synthroid was increased to 125 g daily. She will need a repeat TSH/free T4 in 4-6 weeks. She is noted to be thrombocytopenic which may be due to hypothyroidism. She also has a macrocytosis.  -Will order vitamin B12 for further evaluation. -She is symptomatically improved with regards to pain. Plan discharge back to nursing facility if she continues to improve progressively.    DVT prophylaxis: Lovenox Code Status: Full code Family Communication: Family not available Disposition Plan: Skilled nursing facility once pain is better controlled and pending IR recommendations as patient was unable to have the MRI done as it was stated on prior notes that patient was too obese. Also once confusion has resolved.   Consultants:   Interventional radiology 06/13/2017  Procedures:  2-D echo 06/14/2017 : Study Conclusions - Left ventricle: The cavity size was normal. There was mild   concentric hypertrophy. Systolic function was normal. The   estimated ejection fraction was in the range of 60% to 65%. Wall   motion  was normal; there were no regional wall motion   abnormalities. Features are consistent with a pseudonormal left   ventricular filling pattern, with concomitant abnormal relaxation   and increased filling pressure (grade 2 diastolic  dysfunction).   Acoustic contrast opacification revealed no evidence ofthrombus. - Aortic valve: Left coronary cusp mobility was severely   restricted. There was mild to moderate stenosis. Valve area   (VTI): 1.37 cm^2. Valve area (Vmax): 1.26 cm^2. Valve area   (Vmean): 1.36 cm^2. - Left atrium: The atrium was mildly dilated.  - Pulmonary arteries: PA peak pressure: 31 mm Hg (S).  Antimicrobials:   None   Subjective: Patient denies pain currently. (Delayed entry).  Objective: Vitals:   06/18/17 0500 06/18/17 0547 06/18/17 1011 06/18/17 1300  BP:  (!) 120/35 122/69 (!) 107/38  Pulse:  (!) 59 68 (!) 55  Resp:    20  Temp:  97.9 F (36.6 C)  98.1 F (36.7 C)  TempSrc:  Oral  Oral  SpO2:  100%  95%  Weight: 111.5 kg (245 lb 13 oz)     Height:        Intake/Output Summary (Last 24 hours) at 06/18/17 1944 Last data filed at 06/18/17 1726  Gross per 24 hour  Intake             1200 ml  Output                0 ml  Net             1200 ml   Filed Weights   06/16/17 0641 06/17/17 0500 06/18/17 0500  Weight: 112.4 kg (247 lb 14.4 oz) 112.7 kg (248 lb 6.4 oz) 111.5 kg (245 lb 13 oz)    Examination:  General exam: Patient is sitting up in bed, alert, obese. Respiratory system: Clear to auscultation anterior lung fields. Respiratory effort normal. Cardiovascular system: S1 & S2 heard, RRR. No JVD, murmurs, rubs, gallops or clicks. No pedal edema. Gastrointestinal system: Abdomen is nondistended, soft and nontender. No organomegaly or masses felt. Normal bowel sounds heard. Central nervous system: Alert and oriented to herself and hospital. No focal neurological deficits. Skin: No rashes, lesions or ulcers Psychiatry: Judgement and insight appear fair. Mood & affect flat.     Data Reviewed: I have personally reviewed following labs and imaging studies  CBC:  Recent Labs Lab 06/12/17 1244 06/13/17 0742 06/14/17 0624 06/16/17 0756 06/17/17 0517 06/18/17 0545  WBC  11.0* 7.9 7.1  --  7.9 7.4  NEUTROABS 8.4*  --   --   --  4.8 4.3  HGB 13.9 11.8* 12.7  --  13.5 13.0  HCT 42.1 35.9* 38.4 35.0 41.5 39.0  MCV 100.2* 101.4* 101.3*  --  102.5* 101.6*  PLT 126* 100* 96*  --  106* 106*   Basic Metabolic Panel:  Recent Labs Lab 06/14/17 0624 06/15/17 0606 06/16/17 0622 06/17/17 0517 06/18/17 0545  NA 140 141 144 141 139  K 4.3 4.3 4.5 4.2 3.9  CL 106 110 112* 108 103  CO2 28 26 28 25 28   GLUCOSE 119* 136* 144* 109* 95  BUN 30* 18 16 18  22*  CREATININE 1.32* 0.93 0.90 0.85 1.08*  CALCIUM 8.3* 8.2* 8.3* 8.5* 8.5*  MG  --   --   --  1.6* 2.0   GFR: Estimated Creatinine Clearance: 62.4 mL/min (A) (by C-G formula based on SCr of 1.08 mg/dL (H)). Liver Function Tests:  Recent Labs Lab 06/13/17 [id number]  06/17/17 0517  AST 70* 59*  ALT 36 37  ALKPHOS 166* 171*  BILITOT 4.4* 5.3*  PROT 6.4* 5.9*  ALBUMIN 2.7* 2.3*   No results for input(s): LIPASE, AMYLASE in the last 168 hours.  Recent Labs Lab 06/16/17 0756 06/17/17 0831  AMMONIA 110* 22   Coagulation Profile:  Recent Labs Lab 06/13/17 0844  INR 1.38   Cardiac Enzymes:  Recent Labs Lab 06/12/17 1244  TROPONINI <0.03   BNP (last 3 results) No results for input(s): PROBNP in the last 8760 hours. HbA1C: No results for input(s): HGBA1C in the last 72 hours. CBG:  Recent Labs Lab 06/17/17 1645 06/17/17 2233 06/18/17 0807 06/18/17 1105 06/18/17 1654  GLUCAP 96 139* 104* 166* 129*   Lipid Profile: No results for input(s): CHOL, HDL, LDLCALC, TRIG, CHOLHDL, LDLDIRECT in the last 72 hours. Thyroid Function Tests:  Recent Labs  06/16/17 0756  TSH 26.569*   Anemia Panel:  Recent Labs  06/16/17 0756  VITAMINB12 2,211*   Sepsis Labs: No results for input(s): PROCALCITON, LATICACIDVEN in the last 168 hours.  Recent Results (from the past 240 hour(s))  Culture, Urine     Status: Abnormal   Collection Time: 06/16/17  9:25 AM  Result Value Ref Range Status    Specimen Description URINE, CATHETERIZED  Final   Special Requests NONE  Final   Culture MULTIPLE SPECIES PRESENT, SUGGEST RECOLLECTION (A)  Final   Report Status 06/18/2017 FINAL  Final         Radiology Studies: No results found.      Scheduled Meds: . aspirin  81 mg Oral Daily  . enoxaparin (LOVENOX) injection  40 mg Subcutaneous Q24H  . furosemide  40 mg Oral BID  . insulin aspart  0-9 Units Subcutaneous TID WC  . lactulose  30 g Oral TID  . levothyroxine  125 mcg Oral QAC breakfast  . mouth rinse  15 mL Mouth Rinse BID  . pantoprazole  40 mg Oral Daily  . pravastatin  10 mg Oral q1800  . propranolol  20 mg Oral BID  . rifaximin  550 mg Oral BID  . spironolactone  12.5 mg Oral BID  . venlafaxine XR  150 mg Oral BID   Continuous Infusions:   LOS: 5 days    Time spent: 35 minutes    Elliot CousinFISHER,Manon Banbury, MD [facility] Pager [phone]  If 7PM-7AM, please contact night-coverage [email or web address] Password [facility] 06/18/2017, 7:44 PM

## 2017-06-18 NOTE — Progress Notes (Signed)
Physical Therapy Treatment Patient Details Name: Nancy GentileKacie S Baldwin MRN: [id number] DOB: Aug 18, 1946 Today's Date: 06/18/2017    History of Present Illness 71 year old female with past medical history of diastolic heart failure and cirrhosis and recent severe right humeral fracture this past April who continues to have multiple mechanical falls and presented to the emergency room on 8/2 after she had fallen earlier in the day landing between the bathtub and commode hitting her left shoulder. Patient unable to stand due to excruciating pain on the left side. Found have multiple displaced rib fractures on the left along with an acute compression fracture at T12/L1.     PT Comments    Pt alert and with nurse Baldwin upon PT arrival. She was willing to participate in bed exercises, reporting discomfort in the RUE and Lt ribs. This was monitored during the session. Completed LE exercises in bed, pt able to complete with encouragement from the therapist. Therapist attempted to work on rolling Lt/Rt with the pt, however she had a difficult time following commands and required 2+ assistance for only 2 attempts each direction. Would benefit from assistance in future sessions to improve the safety of both the pt and therapist while working on bed mobility skills. Pt reporting no increase in pain by the end of the session. Current d/c recommendation remains appropriate.     Follow Up Recommendations  SNF;Supervision for mobility/OOB     Equipment Recommendations  None recommended by PT    Recommendations for Other Services       Precautions / Restrictions Precautions Precautions: Fall;Shoulder;Back;Other (comment) Type of Shoulder Precautions: broken Rt humerus.  Shoulder Interventions: Shoulder sling/immobilizer;At all times;Off for dressing/bathing/exercises Precaution Booklet Issued: No Restrictions Weight Bearing Restrictions: Yes RUE Weight Bearing: Non weight bearing    Mobility  Bed  Mobility Overal bed mobility: Needs Assistance Bed Mobility: Rolling Rolling: Max assist;+2 for physical assistance;+2 for safety/equipment         General bed mobility comments: unable to attempt supine to sit due to high level of assistance needed with rolling activity  Transfers                 General transfer comment: Pt was not able to sit up enough to attempt  Ambulation/Gait             General Gait Details: could not attempt   Stairs            Wheelchair Mobility    Modified Rankin (Stroke Patients Only)       Balance                                            Cognition Arousal/Alertness: Awake/alert Behavior During Therapy: Flat affect Overall Cognitive Status: No family/caregiver present to determine baseline cognitive functioning                                 General Comments: Pt unable to follow complex commands, able to follow simple 1 step commands      Exercises General Exercises - Lower Extremity Ankle Circles/Pumps: AROM;Both;15 reps;Supine Heel Slides: AROM;Both;15 reps;Supine Hip ABduction/ADduction: AROM;Both;10 reps;Supine Straight Leg Raises: AROM;10 reps;Both;Supine    General Comments        Pertinent Vitals/Pain Pain Assessment: Faces Faces Pain Scale: Hurts little more Pain Location: Left side ribs  and right shoulder pain Pain Descriptors / Indicators: Sore Pain Intervention(s): Monitored during session;Limited activity within patient's tolerance;Repositioned    Home Living                      Prior Function            PT Goals (current goals can now be found in the care plan section) Acute Rehab PT Goals Patient Stated Goal: To be pain free PT Goal Formulation: With patient Time For Goal Achievement: 06/27/17 Potential to Achieve Goals: Fair Progress towards PT goals: Progressing toward goals (Pt more aroused this session and willing to participate in  session)    Frequency    Min 2X/week      PT Plan Current plan remains appropriate    Co-evaluation              AM-PAC PT "6 Clicks" Daily Activity  Outcome Measure  Difficulty turning over in bed (including adjusting bedclothes, sheets and blankets)?: Total Difficulty moving from lying on back to sitting on the side of the bed? : Total Difficulty sitting down on and standing up from a chair with arms (e.g., wheelchair, bedside commode, etc,.)?: Total Help needed moving to and from a bed to chair (including a wheelchair)?: Total Help needed walking in hospital room?: Total Help needed climbing 3-5 steps with a railing? : Total 6 Click Score: 6    End of Session   Activity Tolerance: Patient tolerated treatment well;Patient limited by pain;Patient limited by fatigue Patient left: in bed;with call bell/phone within reach;with bed alarm set   PT Visit Diagnosis: Unsteadiness on feet (R26.81);History of falling (Z91.81);Difficulty in walking, not elsewhere classified (R26.2);Pain Pain - Right/Left: Right Pain - part of body: Shoulder     Time: 5409-8119 PT Time Calculation (min) (ACUTE ONLY): 14 min  Charges:  $Therapeutic Exercise: 8-22 mins                    G Codes:  Functional Assessment Tool Used: AM-PAC 6 Clicks Basic Mobility Functional Limitation: Mobility: Walking and moving around Mobility: Walking and Moving Around Current Status (J4782): 100 percent impaired, limited or restricted Mobility: Walking and Moving Around Goal Status (N5621): 100 percent impaired, limited or restricted   2:34 PM,06/18/17 Marylyn Ishihara PT, DPT [facility] [phone]

## 2017-06-18 NOTE — Progress Notes (Signed)
Urine culture indeterminate.  Will check I&O cath for better specimen.  RN states patient less confused today and not complaining of pain.  Insurance authorization still pending.  Will need to rule out UTI prior to possible procedure as well.  Still following.  Hether Anselmo E 12:44 PM 06/18/2017

## 2017-06-19 ENCOUNTER — Inpatient Hospital Stay (HOSPITAL_COMMUNITY): Payer: Medicare Other

## 2017-06-19 DIAGNOSIS — S22080A Wedge compression fracture of T11-T12 vertebra, initial encounter for closed fracture: Secondary | ICD-10-CM

## 2017-06-19 DIAGNOSIS — M4850XA Collapsed vertebra, not elsewhere classified, site unspecified, initial encounter for fracture: Secondary | ICD-10-CM

## 2017-06-19 DIAGNOSIS — R52 Pain, unspecified: Secondary | ICD-10-CM

## 2017-06-19 DIAGNOSIS — K7469 Other cirrhosis of liver: Secondary | ICD-10-CM

## 2017-06-19 DIAGNOSIS — D696 Thrombocytopenia, unspecified: Secondary | ICD-10-CM

## 2017-06-19 LAB — CBC
HEMATOCRIT: 38.1 % (ref 36.0–46.0)
HEMOGLOBIN: 12.7 g/dL (ref 12.0–15.0)
MCH: 33.4 pg (ref 26.0–34.0)
MCHC: 33.3 g/dL (ref 30.0–36.0)
MCV: 100.3 fL — AB (ref 78.0–100.0)
Platelets: 107 10*3/uL — ABNORMAL LOW (ref 150–400)
RBC: 3.8 MIL/uL — ABNORMAL LOW (ref 3.87–5.11)
RDW: 15.8 % — ABNORMAL HIGH (ref 11.5–15.5)
WBC: 7.8 10*3/uL (ref 4.0–10.5)

## 2017-06-19 LAB — BASIC METABOLIC PANEL
Anion gap: 8 (ref 5–15)
BUN: 20 mg/dL (ref 6–20)
CHLORIDE: 103 mmol/L (ref 101–111)
CO2: 27 mmol/L (ref 22–32)
CREATININE: 1.11 mg/dL — AB (ref 0.44–1.00)
Calcium: 8.5 mg/dL — ABNORMAL LOW (ref 8.9–10.3)
GFR calc non Af Amer: 49 mL/min — ABNORMAL LOW (ref >60)
GFR, EST AFRICAN AMERICAN: 57 mL/min — AB (ref >60)
Glucose, Bld: 133 mg/dL — ABNORMAL HIGH (ref 65–99)
Potassium: 3.5 mmol/L (ref 3.5–5.1)
Sodium: 138 mmol/L (ref 135–145)

## 2017-06-19 LAB — GLUCOSE, CAPILLARY
GLUCOSE-CAPILLARY: 208 mg/dL — AB (ref 65–99)
Glucose-Capillary: 149 mg/dL — ABNORMAL HIGH (ref 65–99)

## 2017-06-19 LAB — VITAMIN B12: VITAMIN B 12: 2265 pg/mL — AB (ref 180–914)

## 2017-06-19 MED ORDER — TRAMADOL HCL 50 MG PO TABS: 50.0000 mg | ORAL_TABLET | Freq: Four times a day (QID) | ORAL | 0 refills | Status: DC | PRN

## 2017-06-19 MED ORDER — RIFAXIMIN 550 MG PO TABS: 550.0000 mg | ORAL_TABLET | Freq: Two times a day (BID) | ORAL | 0 refills | Status: DC

## 2017-06-19 MED ORDER — LEVOTHYROXINE SODIUM 125 MCG PO TABS: 125.0000 ug | ORAL_TABLET | Freq: Every day | ORAL | Status: AC

## 2017-06-19 MED ORDER — INSULIN GLARGINE 100 UNITS/ML SOLOSTAR PEN: 15.0000 [IU] | PEN_INJECTOR | Freq: Every day | SUBCUTANEOUS | 11 refills | Status: DC

## 2017-06-19 MED ORDER — POTASSIUM CHLORIDE ER 10 MEQ PO TBCR: 10.0000 meq | EXTENDED_RELEASE_TABLET | Freq: Two times a day (BID) | ORAL | Status: DC

## 2017-06-19 MED ORDER — LACTULOSE 10 GM/15ML PO SOLN: 30.0000 g | Freq: Two times a day (BID) | ORAL | Status: DC

## 2017-06-19 MED ORDER — FUROSEMIDE 40 MG PO TABS: 40.0000 mg | ORAL_TABLET | Freq: Two times a day (BID) | ORAL | Status: DC

## 2017-06-19 MED ORDER — PANTOPRAZOLE SODIUM 40 MG PO TBEC: 40.0000 mg | DELAYED_RELEASE_TABLET | Freq: Every day | ORAL | Status: DC

## 2017-06-19 NOTE — Clinical Social Work Note (Signed)
LCSW received number .Auth number [id number], approved for three days. Fleet Contrasachel with [facility] advised that she would provide this information to the facility.     Tecla Mailloux, Juleen ChinaHeather D, LCSW

## 2017-06-19 NOTE — Progress Notes (Signed)
EMS picked up patient. IV removed and site intact.

## 2017-06-19 NOTE — Care Management Note (Signed)
Case Management Note  Patient Details  Name: Celesta GentileKacie S Mathieu MRN: [id number] Date of Birth: 05-Aug-1946   Expected Discharge Date:  06/19/17               Expected Discharge Plan:  Skilled Nursing Facility  In-House Referral:  Clinical Social Work  Discharge planning Services  CM Consult  Post Acute Care Choice:  NA Choice offered to:  NA  Status of Service:  Completed, signed off  If discussed at Long Length of Stay Meetings, dates discussed:  06/19/2017  Additional Comments: Pt discharging to SNF today. CSW has made placement arrangements. [facility] rep, Bonita QuinLinda, aware of DC plan.   Malcolm Metrohildress, Amere Bricco Demske, RN 06/19/2017, 12:57 PM

## 2017-06-19 NOTE — Consult Note (Signed)
Chief Complaint: Patient was seen in consultation today for Thoracic 12 Vertebroplasty/kyphoplasty Chief Complaint  Patient presents with  . Fall   at the request of [facility]  Referring Physician(s): Dr Isla Pence Thompson  Supervising Physician: Julieanne Cottoneveshwar, Sanjeev  Patient Status: [facility] IP  History of Present Illness: Nancy Baldwin is a 71 y.o. female   Pt admitted 8/2 [place] and wedged between toilet and tub CT 8/2: IMPRESSION: Acute left posterior rib fractures affecting the seventh, eighth, ninth and tenth ribs. Seventh, eighth and ninth ribs are displaced. No pneumothorax. Dependent pulmonary atelectasis left more than right. Acute T12 and L1 fractures as described on the abdominal CT. Probably old superior endplate fracture at T11. Old augmented fractures of T7 and T8.  Hx Thoracic 7 and 8 Vertebroplasty 09/2014 with good result. Request made for evaluation and possible treatment Thoracic 12 Dr Corliss Skainseveshwar has reviewed imaging and approves T12 VP/KP  Request for T 12 VP/KP has been ordered Dr Corliss Skainseveshwar has seen and reviewed imaging and approves T12 vertebroplasty/kyphoplasty Insurance now in review for per certification  Pt still with some confusion  Knows name and dob; unable to name year or president  I have seen and examined pt--- Procedure has been broached with pt---not at length Will need consent from Husband   Past Medical History:  Diagnosis Date  . Anxiety   . CHF (congestive heart failure) (HCC)   . Depression   . Hypertension   . Lung cancer [facility](HCC)     Past Surgical History:  Procedure Laterality Date  . ABDOMINAL HYSTERECTOMY    . TONSILLECTOMY AND ADENOIDECTOMY      Allergies: Amoxicillin; Codeine; Decongest-aid [pseudoephedrine]; Decongestant [oxymetazoline]; Metformin and related; and Tricor [fenofibrate]  Medications: Prior to Admission medications   Medication Sig Start Date End Date Taking?  Authorizing Provider  aspirin 81 MG tablet Take 81 mg by mouth daily.   Yes [provider]  furosemide (LASIX) 20 MG tablet Take 60 mg by mouth 2 (two) times daily.    Yes [provider]  HUMALOG KWIKPEN 100 UNIT/ML KiwkPen INJECT 12 UNITS SUBCUTANEOUSLY TWICE DAILY. 05/12/17  Yes [provider]  insulin glargine (LANTUS) 100 unit/mL SOPN Inject 52 Units into the skin daily before breakfast.    Yes [provider]  levothyroxine (SYNTHROID, LEVOTHROID) 100 MCG tablet  06/11/17  Yes [provider]  potassium chloride (K-DUR) 10 MEQ tablet Take 10 mEq by mouth daily.   Yes [provider]  pravastatin (PRAVACHOL) 10 MG tablet Take by mouth daily.  06/11/17  Yes [provider]  propranolol (INDERAL) 20 MG tablet 20 mg 2 (two) times daily.  06/11/17  Yes [provider]  spironolactone (ALDACTONE) 25 MG tablet Take 12.5 mg by mouth 2 (two) times daily.   Yes [provider]  SSD 1 % cream Apply 1 application topically daily.  06/11/17  Yes [provider]  venlafaxine XR (EFFEXOR-XR) 150 MG 24 hr capsule Take 150 mg by mouth 2 (two) times daily.    Yes [provider]     Family History  Problem Relation Age of Onset  . Lung cancer Mother   . Heart disease Father   . Kidney disease Father   . Heart attack Father     Social History   Social History  . Marital status: Married    Spouse name: N/A  . Number of children: N/A  . Years of  education: N/A   Social History Main Topics  . Smoking status: Former Games developer  . Smokeless tobacco: Never Used  . Alcohol use No  . Drug use: No  . Sexual activity: Not Asked   Other Topics Concern  . None   Social History Narrative  . None    Review of Systems: A 12 point ROS discussed and pertinent positives are indicated in the HPI above.  All other systems are negative.  Review of Systems  Constitutional: Positive for activity change and fatigue.  Negative for appetite change, fever and unexpected weight change.  Respiratory: Negative for cough and shortness of breath.   Gastrointestinal: Negative for abdominal pain.  Musculoskeletal: Positive for back pain and gait problem.  Neurological: Positive for weakness.  Psychiatric/Behavioral: Positive for confusion and decreased concentration.    Vital Signs: BP (!) 119/53 (BP Location: Left Arm)   Pulse (!) 54   Temp 98.3 F (36.8 C) (Oral)   Resp 15   Ht 5\' 7"  (1.702 m)   Wt 240 lb 11.9 oz (109.2 kg)   SpO2 96%   BMI 37.71 kg/m   Physical Exam  Cardiovascular: Normal rate and regular rhythm.   Pulmonary/Chest: Effort normal and breath sounds normal.  Abdominal: Soft. Bowel sounds are normal.  Musculoskeletal: Normal range of motion.  Bruising over left arm and side of chest wall Rt arm in sling Tender mid back range  Neurological: She is alert.  Skin: Skin is warm.  Psychiatric: She has a normal mood and affect. Her behavior is normal.  Nursing note and vitals reviewed.   Mallampati Score:  MD Evaluation Airway: WNL Heart: WNL Abdomen: WNL Chest/ Lungs: WNL ASA  Classification: 3 Mallampati/Airway Score: Two  Imaging: Ct Head Wo Contrast  Result Date: 06/12/2017 CLINICAL DATA:  Larey Seat in the bathroom. EXAM: CT HEAD WITHOUT CONTRAST TECHNIQUE: Contiguous axial images were obtained from the base of the skull through the vertex without intravenous contrast. COMPARISON:  None. FINDINGS: Brain: Mild generalized atrophy. Mild chronic small-vessel change of the hemispheric white matter. No sign of acute infarction, mass lesion, hemorrhage, hydrocephalus or extra-axial collection. Vascular: There is atherosclerotic calcification of the major vessels at the base of the brain. Skull: No skull fracture. Sinuses/Orbits: Clear/normal Other: None significant IMPRESSION: No acute or traumatic finding. Atrophy and chronic small-vessel ischemic changes. Electronically Signed   By: Paulina Fusi M.D.   On: 06/12/2017 14:20   Ct Chest W Contrast  Result Date: 06/12/2017 CLINICAL DATA:  Larey Seat in the bathroom. Trauma to the left chest with pain. EXAM: CT CHEST WITH CONTRAST TECHNIQUE: Multidetector CT imaging of the chest was performed during intravenous contrast administration. CONTRAST:  ISOVUE-300 IOPAMIDOL (ISOVUE-300) INJECTION 61% COMPARISON:  CT abdomen same day FINDINGS: Cardiovascular: Aortic atherosclerosis. No aneurysm or dissection. Coronary artery calcification. Heart size upper limits of normal. No pericardial fluid. Multiple esophageal and paraesophageal varices. Mediastinum/Nodes: No mass or lymphadenopathy. Lungs/Pleura: Pulmonary atelectasis and/or scarring in both lungs, most pronounced in the lower lobes left more than right. Small pleural effusions. No pneumothorax. Upper Abdomen: See results of abdominal scan.  Cirrhosis. Musculoskeletal: Comminuted displaced left posterior seventh rib fracture. Displaced left posterior eighth rib fracture. Displaced posterior left ninth rib fracture. Nondisplaced left posterior tenth rib fracture. No right rib fractures are seen. Massively comminuted fracture of the right proximal humerus. This could be an acute on chronic injury. Old augmented thoracic compression fractures at T7 and T8. Old appearing superior endplate compression fracture at T11. This appears  healed. Acute fractures at T12 and L1 as described on the abdominal study. IMPRESSION: Acute left posterior rib fractures affecting the seventh, eighth, ninth and tenth ribs. Seventh, eighth and ninth ribs are displaced. No pneumothorax. Dependent pulmonary atelectasis left more than right. Acute T12 and L1 fractures as described on the abdominal CT. Probably old superior endplate fracture at T11. Old augmented fractures of T7 and T8. Electronically Signed   By: Paulina Fusi M.D.   On: 06/12/2017 14:27   Ct Cervical Spine Wo Contrast  Result Date: 06/12/2017 CLINICAL DATA:  Larey Seat  in the bath room. EXAM: CT CERVICAL SPINE WITHOUT CONTRAST TECHNIQUE: Multidetector CT imaging of the cervical spine was performed without intravenous contrast. Multiplanar CT image reconstructions were also generated. COMPARISON:  None. FINDINGS: Alignment: Normal Skull base and vertebrae: No cervical fracture. Soft tissues and spinal canal: Negative Disc levels: Degenerative spondylosis C5-6 and C6-7 with osteophytic encroachment upon the canal and foramina. IMPRESSION: No traumatic cervical spine finding.  Mid cervical spondylosis. Electronically Signed   By: Paulina Fusi M.D.   On: 06/12/2017 14:28   Ct Abdomen Pelvis W Contrast  Result Date: 06/12/2017 CLINICAL DATA:  Larey Seat in the bathroom.  Left-sided chest pain. EXAM: CT ABDOMEN AND PELVIS WITH CONTRAST TECHNIQUE: Multidetector CT imaging of the abdomen and pelvis was performed using the standard protocol following bolus administration of intravenous contrast. CONTRAST:  ISOVUE-300 IOPAMIDOL (ISOVUE-300) INJECTION 61% COMPARISON:  None. FINDINGS: Lower chest: Atelectasis at both lung bases. See results of chest CT. Hepatobiliary: Cirrhosis pattern of the liver. No evidence of focal liver lesion or liver injury. No calcified stones. Pancreas: Normal Spleen: Mild splenomegaly. 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 Multiple splenic varices. Adrenals/Urinary Tract: Adrenal glands are normal. Multiple retroperitoneal varices on the left. No renal lesion. Stomach/Bowel: No acute bowel finding. Vascular/Lymphatic: Aortic atherosclerosis. No aneurysm. IVC is normal. No retroperitoneal adenopathy. Reproductive: Previous hysterectomy. No pelvic mass. Tiny amount of free fluid in the pelvis. Other: No free air. Musculoskeletal: Spinal curvature. Acute superior endplate fracture at T12 with loss of height of 30%. Acute compression fracture at L1 with loss of height of 80%. No retropulsed bone. See results of chest CT for thoracic spinal  findings. IMPRESSION: No acute intraabdominal finding. Cirrhosis and portal venous hypertension. Splenomegaly with extensive varices. Acute superior endplate compression fracture at T12 with loss of height of 30%. Acute compression fracture at L1 with loss of height of 80%. Electronically Signed   By: Paulina Fusi M.D.   On: 06/12/2017 14:19   Dg Chest Port 1 View  Result Date: 06/19/2017 CLINICAL DATA:  Acute diastolic heart failure EXAM: PORTABLE CHEST 1 VIEW COMPARISON:  06/12/2017 FINDINGS: Progression of left lower lobe consolidation and left effusion. Right lower lobe atelectasis unchanged. Mild vascular congestion. IMPRESSION: Progression of left lower lobe atelectasis and left effusion. Probable fluid overload unchanged. Electronically Signed   By: Marlan Palau M.D.   On: 06/19/2017 07:59   Dg Chest Portable 1 View  Result Date: 06/12/2017 CLINICAL DATA:  Left-sided chest pain status post fall between the bathtub in the commode. History of lung malignancy, CHF, cirrhosis, and former smoker. EXAM: PORTABLE CHEST 1 VIEW COMPARISON:  Report of a chest x-ray dated February 03, 2014 FINDINGS: The lungs are borderline hypoinflated. There is increased density at the left lung base with partial obscuration of the hemidiaphragm. There is no pneumothorax. No large pleural effusion is observed. The cardiac silhouette is enlarged. The central pulmonary vascularity  is engorged with mild cephalization. There is calcification in the wall of the aortic arch. The observed bony thorax is unremarkable. There is an old fracture of the proximal left humerus with displacement of the humeral head. IMPRESSION: CHF with mild pulmonary interstitial edema. I cannot exclude bibasilar atelectasis greatest on the left. There is no pneumothorax or pleural effusion. Given these findings and the patient's symptoms, chest CT scanning would be a useful next imaging step. Electronically Signed   By: David  [place] M.D.   On: 06/12/2017  12:49    Labs:  CBC:  Recent Labs  06/14/17 0624 06/16/17 0756 06/17/17 0517 06/18/17 0545 06/19/17 0429  WBC 7.1  --  7.9 7.4 7.8  HGB 12.7  --  13.5 13.0 12.7  HCT 38.4 35.0 41.5 39.0 38.1  PLT 96*  --  106* 106* 107*    COAGS:  Recent Labs  06/13/17 0844  INR 1.38    BMP:  Recent Labs  06/16/17 0622 06/17/17 0517 06/18/17 0545 06/19/17 0429  NA 144 141 139 138  K 4.5 4.2 3.9 3.5  CL 112* 108 103 103  CO2 28 25 28 27   GLUCOSE 144* 109* 95 133*  BUN 16 18 22* 20  CALCIUM 8.3* 8.5* 8.5* 8.5*  CREATININE 0.90 0.85 1.08* 1.11*  GFRNONAA >60 >60 51* 49*  GFRAA >60 >60 59* 57*    LIVER FUNCTION TESTS:  Recent Labs  06/13/17 0844 06/17/17 0517  BILITOT 4.4* 5.3*  AST 70* 59*  ALT 36 37  ALKPHOS 166* 171*  PROT 6.4* 5.9*  ALBUMIN 2.7* 2.3*    TUMOR MARKERS: No results for input(s): AFPTM, CEA, CA199, CHROMGRNA in the last 8760 hours.  Assessment and Plan:  Back pain Acute Thoracic 12 fracture Scheduled for possible T12 VP/KP Insurance in precertification process IR will keep on Radar and await insurance. Will need consent from husband.  Thank you for this interesting consult.  I greatly enjoyed meeting DAJE STARK and look forward to participating in their care.  A copy of this report was sent to the requesting provider on this date.  Electronically Signed: Hodges Treiber A, PA-C 06/19/2017, 10:14 AM   I spent a total of 40 Minutes    in face to face in clinical consultation, greater than 50% of which was counseling/coordinating care for T12 VP/KP

## 2017-06-19 NOTE — Clinical Social Work Note (Signed)
LCSW [facility] updated clinicals for OT and [facility]  LCSW left [facility] inqu[facility] about bed [facility].   Luanna Weesner, Juleen ChinaHeather D, LCSW

## 2017-06-19 NOTE — Progress Notes (Signed)
OT Cancellation Note  Patient Details Name: Nancy Baldwin MRN: [id number] DOB: 1946-09-12   Cancelled Treatment:    Reason Eval/Treat Not Completed: Patient declined, no reason specified. OT attempted treatment with pt, pt eating breakfast on OT arrival, RUE out of sling. Pt declined to participate in any OT activities, stating she was going to sleep some more. OT assisted pt with donning sling and positioning RUE on pillow. Will check back at a later time.   Ezra SitesLeslie Troxler, OTR/L  [phone] 06/19/2017, 9:14 AM

## 2017-06-19 NOTE — Progress Notes (Signed)
[facility]. Awaiting EMS to pick up patient.

## 2017-06-19 NOTE — Care Management Important Message (Signed)
Important Message  Patient Details  Name: Celesta GentileKacie S Kinsella MRN: [id number] Date of Birth: 07/07/1946   Medicare Important Message Given:  Yes    Malcolm MetroChildress, Kegan Mckeithan Demske, RN 06/19/2017, 12:56 PM

## 2017-06-19 NOTE — Clinical Social Work Placement (Signed)
   CLINICAL SOCIAL WORK PLACEMENT  NOTE  Date:  06/19/2017  Patient Details  Name: Nancy Baldwin MRN: [id number] Date of Birth: [birth year]  Clinical Social Work is seeking post-discharge placement for this patient at the Skilled  Nursing Facility level of care (*CSW will initial, date and re-position this form in  chart as items are completed):  Yes   Patient/family provided with [facility] Clinical Social Work Department's list of facilities offering this level of care within the geographic area requested by the patient (or if unable, by the patient's family).  Yes   Patient/family informed of their freedom to choose among providers that offer the needed level of care, that participate in Medicare, Medicaid or managed care program needed by the patient, have an available bed and are willing to accept the patient.  Yes   Patient/family informed of [facility]'s ownership interest in [facility] and [facility], as well as of the fact that they are under no obligation to receive care at these facilities.  PASRR submitted to EDS on       PASRR number received on       Existing PASRR number confirmed on 06/17/17     FL2 transmitted to all facilities in geographic area requested by pt/family on 06/17/17     FL2 transmitted to all facilities within larger geographic area on       Patient informed that his/her managed care company has contracts with or will negotiate with certain facilities, including the following:        Yes   Patient/family informed of bed offers received.  [facility]     Physician recommends and patient chooses bed at      [facility] 06[facility]19/17.  Patient to be transferred to facility by EMS     Patient family notified on 06/19/17 of transfer.  Name of family member notified:  son, daughter  in law, grandchildren at bedside     PHYSICIAN       Additional Comment:  LCSW  notified Elease [facility]oatricia at [facility] of [facility] discharge. Clinicals sent via [employer].  _______________________________________________ Annice NeedySettle, Mabel Roll D, LCSW 06/19/2017, 12:35 PM

## 2017-06-19 NOTE — Discharge Summary (Signed)
Physician Discharge Summary  [name] QIO:[id number] DOB: 07-Jan-1946 DOA: 06/12/2017  PCP: Ignatius Specking, MD  Admit date: 06/12/2017 Discharge date: 06/19/2017  Time spent: Greater than 30 minutes  Recommendations for Outpatient Follow-up:  1. The doses of Lasix and Lantus were  decreased. They can be increased again if clinically appropriate. 2. Insurance is pending for interventional radiology's recommendation of vertebroplasty or kyphoplasty. 3. Recommend outpatient orthopedic consultation of the right humeral fracture. 4. Patient is being discharged to [facility] nursing facility.   Discharge Diagnoses:  1. Multiple rib fractures, displaced, following multiple falls at home. 2. Intractable pain secondary to #1. 3. Compression fracture of T12 and L1. -Pending insurance approval for vertebroplasty or kyphoplasty. 4. Acute on chronic diastolic heart failure. 5. 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 6. Acute hepatic encephalopathy. 7. Chronic thrombocytopenia. 8. Type 2 insulin-dependent diabetes mellitus. 9. Hypothyroidism. 10. Morbid obesity. 11. Depression. 12. History of right humeral fracture, 02/2017.   Discharge Condition: Improved.  Diet recommendation: Heart healthy/carbohydrate modified.  Filed Weights   06/17/17 0500 06/18/17 0500 06/19/17 0500  Weight: 112.7 kg (248 lb 6.4 oz) 111.5 kg (245 lb 13 oz) 109.2 kg (240 lb 11.9 oz)    History of present illness:  Nancy Baldwin is a 71 y.o. female with medical history significant of severe right humeral fracture in April 2018 with a consequent ambulatory dysfunction. Patient has had multiple mechanical falls, the last event happened on the day of admission when she slipped on the bathroom floor, landing between the bathtub and the commode, hitting her left shoulder, no head trauma or loss of consciousness. After the fall patient experienced excruciating pain on her left chest wall, worse with deep inspiration or movement, no  radiation, no improving factors, no other associated symptoms. Patient was unable to stand back on her feet, EMS was called and patient was brought into the hospital. She has had no follow-up for her right humeral fracture, she has been using a sling intermittently.  In the ED, the patient was diagnosed with multiple re-fractures on the left, new compression fractures, intractable pain, and not being able to ambulate. She was admitted for further evaluation and management.  Hospital Course:  1. Multiple rib fractures, left rib cage, displaced. The patient was started on supportive treatment and pain medication with as needed IV morphine, as needed IV Toradol, and scheduled ibuprofen. Follow precautions were ordered. Chest CT revealed no evidence of pneumothorax. -IV analgesics were discontinued in favor of as needed tramadol when her pain subsided. -Physical therapy was consulted. They recommended rehabilitation in a skilled environment. The family agreed. -The patient was discharged on when necessary tramadol.  T12 and L1 compression fractures. Patient was started on supportive treatment and analgesics as stated above in #1. -Interventional radiology was consulted to evaluate the patient for vertebroplasty versus kyphoplasty. They recommended ordering an MRI, however, the patient was too obese to fit into the MRI scanner, therefore it was canceled. -Per conversation with interventional radiologist PA, Ms. Carleene Mains, the acute thoracic 12 fracture could be possibly scheduled for VP/KP pending insurance precertification process. Ms. Carleene Mains was made aware that the patient would be discharged to [facility] skilled nursing facility today 06/19/17. If possible, the VP/KP can be pursued in the outpatient setting; again pending insurance approval.  Right humeral fracture. Apparently, the patient sustained a right humeral fracture in April 2018. -Recommend outpatient orthopedic consultation at the SNF.  Acute  on chronic diastolic heart failure. Patient was initially started on IV fluids and her  diuretics were withheld. She appeared to become volume overloaded. IV Lasix was started. Spironolactone was restarted. -2-D echocardiogram was ordered and revealed an EF of 60-65%, no wall motion abnormalities, grade 2 diastolic dysfunction, and mild to moderate AS. -Clinically, the acute nature resolved. -Her home dose of Lasix was reduced from 60 mg twice a day to 40 mg twice a day when her creatinine started trending up. It can be restarted if she appears to become volume overloaded. She was discharged on spironolactone at the prehospital dosing.  Presumed hepatic cirrhosis and hepatic encephalopathy. CT of the patient's abdomen revealed a cirrhosis pattern of the liver without evidence of focal lesion or liver injury. Patient is treated chronically with spironolactone, furosemide, and propanolol which were originally thought to be secondary to chronic diastolic heart failure. -Her diuretics were initially withheld, but were restarted. -She became confused. An ammonia level was ordered and it was elevated. Her RPR, and vitamin B12 levels were not deficient. Her HIV was negative. Her TSH was elevated. (See below). -She was subsequently started on rifaximin and lactulose. -Her mental status improved. Her ammonia level normalized.  Hypothyroidism. Patient was treated with Synthroid 100 g daily. Her TSH was assessed and was elevated at 26.569. -Synthroid dosing was increased to 125 g daily. -Recommend a follow-up TSH and free T4 in 4-6 weeks.  Type 2 diabetes mellitus, insulin-dependent. Patient is chronically treated with Lantus and HumaLog. -She was restarted on sliding-scale NovoLog. Her CBGs were relatively well controlled. -At the time of discharge, her home dose of Lantus was decreased and Humalog was continued at the dosing prior to the hospital admission. If her CBGs become more elevated, Lantus can  be titrated back up to her prehospital dose of 52 units daily.  Chronic thrombocytopenia. Per chart review, the patient has had thrombocytopenia for at least 2 years. Her platelet count was in the 106-107 range during hospitalization. 2 years ago it was 100. -Etiology is unclear, but it could be due to hypothyroidism and hepatic cirrhosis. -Recommend continued monitoring.   Procedures: 2-D echo 06/14/17: Study Conclusions - Left ventricle: The cavity size was normal. There was mild   concentric hypertrophy. Systolic function was normal. The   estimated ejection fraction was in the range of 60% to 65%. Wall   motion was normal; there were no regional wall motion   abnormalities. Features are consistent with a pseudonormal left   ventricular filling pattern, with concomitant abnormal relaxation   and increased filling pressure (grade 2 diastolic dysfunction).   Acoustic contrast opacification revealed no evidence ofthrombus. - Aortic valve: Left coronary cusp mobility was severely   restricted. There was mild to moderate stenosis. Valve area   (VTI): 1.37 cm^2. Valve area (Vmax): 1.26 cm^2. Valve area   (Vmean): 1.36 cm^2. - Left atrium: The atrium was mildly dilated. - Pulmonary arteries: PA peak pressure: 31 mm Hg (S).   Consultations:  Interventional radiology  Discharge Exam: Vitals:   06/19/17 0500 06/19/17 1014  BP: (!) 119/53 127/66  Pulse: (!) 54 68  Resp: 15   Temp: 98.3 F (36.8 C)   SpO2: 96%     General: Morbidly obese 71 year old woman in no acute distress. Cardiovascular: S1, S2, no murmurs rubs or gallops. Respiratory: clear anteriorly and decreased breath sounds in the bases.  Discharge Instructions   Discharge Instructions    Diet - low sodium heart healthy    Complete by:  As directed    Increase activity slowly    Complete by:  As directed      Current Discharge Medication List    START taking these medications   Details  lactulose (CHRONULAC)  10 GM/15ML solution Take 45 mLs (30 g total) by mouth 2 (two) times daily.    pantoprazole (PROTONIX) 40 MG tablet Take 1 tablet (40 mg total) by mouth daily.    rifaximin (XIFAXAN) 550 MG TABS tablet Take 1 tablet (550 mg total) by mouth 2 (two) times daily. Qty: 42 tablet, Refills: 0    traMADol (ULTRAM) 50 MG tablet Take 1 tablet (50 mg total) by mouth every 6 (six) hours as needed for severe pain. Qty: 30 tablet, Refills: 0      CONTINUE these medications which have CHANGED   Details  furosemide (LASIX) 40 MG tablet Take 1 tablet (40 mg total) by mouth 2 (two) times daily. Qty: 30 tablet    insulin glargine (LANTUS) 100 unit/mL SOPN Inject 0.15 mLs (15 Units total) into the skin daily before breakfast. Qty: 15 mL, Refills: 11    levothyroxine (SYNTHROID, LEVOTHROID) 125 MCG tablet Take 1 tablet (125 mcg total) by mouth daily before breakfast.    potassium chloride (K-DUR) 10 MEQ tablet Take 1 tablet (10 mEq total) by mouth 2 (two) times daily.      CONTINUE these medications which have NOT CHANGED   Details  aspirin 81 MG tablet Take 81 mg by mouth daily.    HUMALOG KWIKPEN 100 UNIT/ML KiwkPen INJECT 12 UNITS SUBCUTANEOUSLY TWICE DAILY. Refills: 12    pravastatin (PRAVACHOL) 10 MG tablet Take by mouth daily.  Refills: 11    propranolol (INDERAL) 20 MG tablet 20 mg 2 (two) times daily.  Refills: 3    spironolactone (ALDACTONE) 25 MG tablet Take 12.5 mg by mouth 2 (two) times daily.    SSD 1 % cream Apply 1 application topically daily.  Refills: 1    venlafaxine XR (EFFEXOR-XR) 150 MG 24 hr capsule Take 150 mg by mouth 2 (two) times daily.        Allergies  Allergen Reactions  . Amoxicillin Other (See Comments)    Unknown  . Codeine Other (See Comments)    Unknown  . Decongest-Aid [Pseudoephedrine] Other (See Comments)    Unknown  . Decongestant [Oxymetazoline] Other (See Comments)    Unknown  . Metformin And Related Other (See Comments)    Unkown  . Tricor  [Fenofibrate] Other (See Comments)    Unknown   Contact information for after-discharge care    Destination    [facility] .   Specialty:  Skilled Nursing Facility Contact information: [address] [place] [id number] [phone]               The results of significant diagnostics from this hospitalization (including imaging, microbiology, ancillary and laboratory) are listed below for reference.    Significant Diagnostic Studies: Ct Head Wo Contrast  Result Date: 06/12/2017 CLINICAL DATA:  Larey SeatFell in the bathroom. EXAM: CT HEAD WITHOUT CONTRAST TECHNIQUE: Contiguous axial images were obtained from the base of the skull through the vertex without intravenous contrast. COMPARISON:  None. FINDINGS: Brain: Mild generalized atrophy. Mild chronic small-vessel change of the hemispheric white matter. No sign of acute infarction, mass lesion, hemorrhage, hydrocephalus or extra-axial collection. Vascular: There is atherosclerotic calcification of the major vessels at the base of the brain. Skull: No skull fracture. Sinuses/Orbits: Clear/normal Other: None significant IMPRESSION: No acute or traumatic finding. Atrophy and chronic small-vessel ischemic changes. Electronically Signed  By: Paulina Fusi M.D.   On: 06/12/2017 14:20   Ct Chest W Contrast  Result Date: 06/12/2017 CLINICAL DATA:  Larey Seat in the bathroom. Trauma to the left chest with pain. EXAM: CT CHEST WITH CONTRAST TECHNIQUE: Multidetector CT imaging of the chest was performed during intravenous contrast administration. CONTRAST:  ISOVUE-300 IOPAMIDOL (ISOVUE-300) INJECTION 61% COMPARISON:  CT abdomen same day FINDINGS: Cardiovascular: Aortic atherosclerosis. No aneurysm or dissection. Coronary artery calcification. Heart size upper limits of normal. No pericardial fluid. Multiple esophageal and paraesophageal varices. Mediastinum/Nodes: No mass or lymphadenopathy. Lungs/Pleura: Pulmonary atelectasis  and/or scarring in both lungs, most pronounced in the lower lobes left more than right. Small pleural effusions. No pneumothorax. Upper Abdomen: See results of abdominal scan.  Cirrhosis. Musculoskeletal: Comminuted displaced left posterior seventh rib fracture. Displaced left posterior eighth rib fracture. Displaced posterior left ninth rib fracture. Nondisplaced left posterior tenth rib fracture. No right rib fractures are seen. Massively comminuted fracture of the right proximal humerus. This could be an acute on chronic injury. Old augmented thoracic compression fractures at T7 and T8. Old appearing superior endplate compression fracture at T11. This appears healed. Acute fractures at T12 and L1 as described on the abdominal study. IMPRESSION: Acute left posterior rib fractures affecting the seventh, eighth, ninth and tenth ribs. Seventh, eighth and ninth ribs are displaced. No pneumothorax. Dependent pulmonary atelectasis left more than right. Acute T12 and L1 fractures as described on the abdominal CT. Probably old superior endplate fracture at T11. Old augmented fractures of T7 and T8. Electronically Signed   By: Paulina Fusi M.D.   On: 06/12/2017 14:27   Ct Cervical Spine Wo Contrast  Result Date: 06/12/2017 CLINICAL DATA:  Larey Seat in the bath room. EXAM: CT CERVICAL SPINE WITHOUT CONTRAST TECHNIQUE: Multidetector CT imaging of the cervical spine was performed without intravenous contrast. Multiplanar CT image reconstructions were also generated. COMPARISON:  None. FINDINGS: Alignment: Normal Skull base and vertebrae: No cervical fracture. Soft tissues and spinal canal: Negative Disc levels: Degenerative spondylosis C5-6 and C6-7 with osteophytic encroachment upon the canal and foramina. IMPRESSION: No traumatic cervical spine finding.  Mid cervical spondylosis. Electronically Signed   By: Paulina Fusi M.D.   On: 06/12/2017 14:28   Ct Abdomen Pelvis W Contrast  Result Date: 06/12/2017 CLINICAL DATA:  Larey Seat  in the bathroom.  Left-sided chest pain. EXAM: CT ABDOMEN AND PELVIS WITH CONTRAST TECHNIQUE: Multidetector CT imaging of the abdomen and pelvis was performed using the standard protocol following bolus administration of intravenous contrast. CONTRAST:  ISOVUE-300 IOPAMIDOL (ISOVUE-300) INJECTION 61% COMPARISON:  None. FINDINGS: Lower chest: Atelectasis at both lung bases. See results of chest CT. Hepatobiliary: Cirrhosis pattern of the liver. No evidence of focal liver lesion or liver injury. No calcified stones. Pancreas: Normal Spleen: Mild splenomegaly. 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 Multiple splenic varices. Adrenals/Urinary Tract: Adrenal glands are normal. Multiple retroperitoneal varices on the left. No renal lesion. Stomach/Bowel: No acute bowel finding. Vascular/Lymphatic: Aortic atherosclerosis. No aneurysm. IVC is normal. No retroperitoneal adenopathy. Reproductive: Previous hysterectomy. No pelvic mass. Tiny amount of free fluid in the pelvis. Other: No free air. Musculoskeletal: Spinal curvature. Acute superior endplate fracture at T12 with loss of height of 30%. Acute compression fracture at L1 with loss of height of 80%. No retropulsed bone. See results of chest CT for thoracic spinal findings. IMPRESSION: No acute intraabdominal finding. Cirrhosis and portal venous hypertension. Splenomegaly with extensive varices. Acute superior endplate compression fracture at  T12 with loss of height of 30%. Acute compression fracture at L1 with loss of height of 80%. Electronically Signed   By: Paulina Fusi M.D.   On: 06/12/2017 14:19   Dg Chest Port 1 View  Result Date: 06/19/2017 CLINICAL DATA:  Acute diastolic heart failure EXAM: PORTABLE CHEST 1 VIEW COMPARISON:  06/12/2017 FINDINGS: Progression of left lower lobe consolidation and left effusion. Right lower lobe atelectasis unchanged. Mild vascular congestion. IMPRESSION: Progression of left lower lobe atelectasis  and left effusion. Probable fluid overload unchanged. Electronically Signed   By: Marlan Palau M.D.   On: 06/19/2017 07:59   Dg Chest Portable 1 View  Result Date: 06/12/2017 CLINICAL DATA:  Left-sided chest pain status post fall between the bathtub in the commode. History of lung malignancy, CHF, cirrhosis, and former smoker. EXAM: PORTABLE CHEST 1 VIEW COMPARISON:  Report of a chest x-ray dated February 03, 2014 FINDINGS: The lungs are borderline hypoinflated. There is increased density at the left lung base with partial obscuration of the hemidiaphragm. There is no pneumothorax. No large pleural effusion is observed. The cardiac silhouette is enlarged. The central pulmonary vascularity is engorged with mild cephalization. There is calcification in the wall of the aortic arch. The observed bony thorax is unremarkable. There is an old fracture of the proximal left humerus with displacement of the humeral head. IMPRESSION: CHF with mild pulmonary interstitial edema. I cannot exclude bibasilar atelectasis greatest on the left. There is no pneumothorax or pleural effusion. Given these findings and the patient's symptoms, chest CT scanning would be a useful next imaging step. Electronically Signed   By: David  [place] M.D.   On: 06/12/2017 12:49    Microbiology: Recent Results (from the past 240 hour(s))  Culture, Urine     Status: Abnormal   Collection Time: 06/16/17  9:25 AM  Result Value Ref Range Status   Specimen Description URINE, CATHETERIZED  Final   Special Requests NONE  Final   Culture MULTIPLE SPECIES PRESENT, SUGGEST RECOLLECTION (A)  Final   Report Status 06/18/2017 FINAL  Final     Labs: Basic Metabolic Panel:  Recent Labs Lab 06/15/17 0606 06/16/17 0622 06/17/17 0517 06/18/17 0545 06/19/17 0429  NA 141 144 141 139 138  K 4.3 4.5 4.2 3.9 3.5  CL 110 112* 108 103 103  CO2 26 28 25 28 27   GLUCOSE 136* 144* 109* 95 133*  BUN 18 16 18  22* 20  CREATININE 0.93 0.90 0.85 1.08*  1.11*  CALCIUM 8.2* 8.3* 8.5* 8.5* 8.5*  MG  --   --  1.6* 2.0  --    Liver Function Tests:  Recent Labs Lab 06/13/17 0844 06/17/17 0517  AST 70* 59*  ALT 36 37  ALKPHOS 166* 171*  BILITOT 4.4* 5.3*  PROT 6.4* 5.9*  ALBUMIN 2.7* 2.3*   No results for input(s): LIPASE, AMYLASE in the last 168 hours.  Recent Labs Lab 06/16/17 0756 06/17/17 0831  AMMONIA 110* 22   CBC:  Recent Labs Lab 06/13/17 0742 06/14/17 0624 06/16/17 0756 06/17/17 0517 06/18/17 0545 06/19/17 0429  WBC 7.9 7.1  --  7.9 7.4 7.8  NEUTROABS  --   --   --  4.8 4.3  --   HGB 11.8* 12.7  --  13.5 13.0 12.7  HCT 35.9* 38.4 35.0 41.5 39.0 38.1  MCV 101.4* 101.3*  --  102.5* 101.6* 100.3*  PLT 100* 96*  --  106* 106* 107*   Cardiac Enzymes: No results for input(s):  CKTOTAL, CKMB, CKMBINDEX, TROPONINI in the last 168 hours. BNP: BNP (last 3 results)  Recent Labs  06/12/17 1244 06/14/17 0624  BNP 539.0* 306.0*    ProBNP (last 3 results) No results for input(s): PROBNP in the last 8760 hours.  CBG:  Recent Labs Lab 06/18/17 1105 06/18/17 1654 06/18/17 2054 06/19/17 0748 06/19/17 1107  GLUCAP 166* 129* 136* 149* 208*       Signed:  Kameela Leipold MD.  [facility] 06/19/2017, 12:52 PM

## 2017-06-20 LAB — URINE CULTURE: CULTURE: NO GROWTH

## 2017-07-07 ENCOUNTER — Telehealth (HOSPITAL_COMMUNITY): Payer: Self-pay

## 2017-07-07 NOTE — Telephone Encounter (Signed)
Called to schedule vertebroplasty. Spoke to pt's nurse and she stated that the pt hasn't complained of any back pain. She will go and talk with the pt and give me a call back. AW

## 2017-10-08 ENCOUNTER — Emergency Department (HOSPITAL_COMMUNITY): Payer: Medicare Other

## 2017-10-08 ENCOUNTER — Other Ambulatory Visit: Payer: Self-pay

## 2017-10-08 ENCOUNTER — Inpatient Hospital Stay (HOSPITAL_COMMUNITY)
Admission: EM | Admit: 2017-10-08 | Discharge: 2017-10-11 | DRG: 871 | Disposition: A | Discharge status: Home Health | Payer: Medicare Other | Attending: Internal Medicine | Admitting: Internal Medicine

## 2017-10-08 ENCOUNTER — Encounter (HOSPITAL_COMMUNITY): Payer: Self-pay

## 2017-10-08 DIAGNOSIS — Z85118 Personal history of other malignant neoplasm of bronchus and lung: Secondary | ICD-10-CM

## 2017-10-08 DIAGNOSIS — I5030 Unspecified diastolic (congestive) heart failure: Secondary | ICD-10-CM | POA: Diagnosis present

## 2017-10-08 DIAGNOSIS — Z79890 Hormone replacement therapy (postmenopausal): Secondary | ICD-10-CM | POA: Diagnosis not present

## 2017-10-08 DIAGNOSIS — Z881 Allergy status to other antibiotic agents status: Secondary | ICD-10-CM | POA: Diagnosis not present

## 2017-10-08 DIAGNOSIS — Z7982 Long term (current) use of aspirin: Secondary | ICD-10-CM

## 2017-10-08 DIAGNOSIS — E1122 Type 2 diabetes mellitus with diabetic chronic kidney disease: Secondary | ICD-10-CM | POA: Diagnosis present

## 2017-10-08 DIAGNOSIS — F419 Anxiety disorder, unspecified: Secondary | ICD-10-CM | POA: Diagnosis present

## 2017-10-08 DIAGNOSIS — E039 Hypothyroidism, unspecified: Secondary | ICD-10-CM | POA: Diagnosis present

## 2017-10-08 DIAGNOSIS — K7460 Unspecified cirrhosis of liver: Secondary | ICD-10-CM | POA: Diagnosis present

## 2017-10-08 DIAGNOSIS — L03115 Cellulitis of right lower limb: Secondary | ICD-10-CM | POA: Diagnosis present

## 2017-10-08 DIAGNOSIS — Z888 Allergy status to other drugs, medicaments and biological substances status: Secondary | ICD-10-CM

## 2017-10-08 DIAGNOSIS — I130 Hypertensive heart and chronic kidney disease with heart failure and stage 1 through stage 4 chronic kidney disease, or unspecified chronic kidney disease: Secondary | ICD-10-CM | POA: Diagnosis present

## 2017-10-08 DIAGNOSIS — E860 Dehydration: Secondary | ICD-10-CM | POA: Diagnosis present

## 2017-10-08 DIAGNOSIS — F329 Major depressive disorder, single episode, unspecified: Secondary | ICD-10-CM | POA: Diagnosis present

## 2017-10-08 DIAGNOSIS — R404 Transient alteration of awareness: Secondary | ICD-10-CM | POA: Diagnosis not present

## 2017-10-08 DIAGNOSIS — N182 Chronic kidney disease, stage 2 (mild): Secondary | ICD-10-CM | POA: Diagnosis present

## 2017-10-08 DIAGNOSIS — E1169 Type 2 diabetes mellitus with other specified complication: Secondary | ICD-10-CM | POA: Diagnosis present

## 2017-10-08 DIAGNOSIS — A419 Sepsis, unspecified organism: Principal | ICD-10-CM | POA: Diagnosis present

## 2017-10-08 DIAGNOSIS — R296 Repeated falls: Secondary | ICD-10-CM | POA: Diagnosis present

## 2017-10-08 DIAGNOSIS — Z90710 Acquired absence of both cervix and uterus: Secondary | ICD-10-CM | POA: Diagnosis not present

## 2017-10-08 DIAGNOSIS — L03119 Cellulitis of unspecified part of limb: Secondary | ICD-10-CM

## 2017-10-08 DIAGNOSIS — I10 Essential (primary) hypertension: Secondary | ICD-10-CM | POA: Diagnosis present

## 2017-10-08 DIAGNOSIS — Z8249 Family history of ischemic heart disease and other diseases of the circulatory system: Secondary | ICD-10-CM | POA: Diagnosis not present

## 2017-10-08 DIAGNOSIS — G9341 Metabolic encephalopathy: Secondary | ICD-10-CM | POA: Diagnosis present

## 2017-10-08 DIAGNOSIS — D696 Thrombocytopenia, unspecified: Secondary | ICD-10-CM | POA: Diagnosis present

## 2017-10-08 DIAGNOSIS — Z794 Long term (current) use of insulin: Secondary | ICD-10-CM | POA: Diagnosis not present

## 2017-10-08 DIAGNOSIS — Z23 Encounter for immunization: Secondary | ICD-10-CM

## 2017-10-08 DIAGNOSIS — N179 Acute kidney failure, unspecified: Secondary | ICD-10-CM | POA: Diagnosis present

## 2017-10-08 DIAGNOSIS — I5032 Chronic diastolic (congestive) heart failure: Secondary | ICD-10-CM | POA: Diagnosis present

## 2017-10-08 DIAGNOSIS — R7989 Other specified abnormal findings of blood chemistry: Secondary | ICD-10-CM | POA: Diagnosis not present

## 2017-10-08 DIAGNOSIS — D649 Anemia, unspecified: Secondary | ICD-10-CM | POA: Diagnosis present

## 2017-10-08 DIAGNOSIS — Z801 Family history of malignant neoplasm of trachea, bronchus and lung: Secondary | ICD-10-CM

## 2017-10-08 DIAGNOSIS — R4182 Altered mental status, unspecified: Secondary | ICD-10-CM | POA: Diagnosis present

## 2017-10-08 DIAGNOSIS — R748 Abnormal levels of other serum enzymes: Secondary | ICD-10-CM

## 2017-10-08 DIAGNOSIS — K7581 Nonalcoholic steatohepatitis (NASH): Secondary | ICD-10-CM | POA: Diagnosis present

## 2017-10-08 DIAGNOSIS — Z6837 Body mass index (BMI) 37.0-37.9, adult: Secondary | ICD-10-CM

## 2017-10-08 DIAGNOSIS — W19XXXA Unspecified fall, initial encounter: Secondary | ICD-10-CM

## 2017-10-08 DIAGNOSIS — Z841 Family history of disorders of kidney and ureter: Secondary | ICD-10-CM | POA: Diagnosis not present

## 2017-10-08 DIAGNOSIS — K7469 Other cirrhosis of liver: Secondary | ICD-10-CM | POA: Diagnosis not present

## 2017-10-08 DIAGNOSIS — E1151 Type 2 diabetes mellitus with diabetic peripheral angiopathy without gangrene: Secondary | ICD-10-CM | POA: Diagnosis present

## 2017-10-08 LAB — COMPREHENSIVE METABOLIC PANEL
ALBUMIN: 2.2 g/dL — AB (ref 3.5–5.0)
ALK PHOS: 414 U/L — AB (ref 38–126)
ALT: 108 U/L — ABNORMAL HIGH (ref 14–54)
ANION GAP: 9 (ref 5–15)
AST: 159 U/L — ABNORMAL HIGH (ref 15–41)
BILIRUBIN TOTAL: 4 mg/dL — AB (ref 0.3–1.2)
BUN: 43 mg/dL — ABNORMAL HIGH (ref 6–20)
CALCIUM: 9.3 mg/dL (ref 8.9–10.3)
CO2: 25 mmol/L (ref 22–32)
CREATININE: 1.57 mg/dL — AB (ref 0.44–1.00)
Chloride: 102 mmol/L (ref 101–111)
GFR calc Af Amer: 37 mL/min — ABNORMAL LOW (ref >60)
GFR calc non Af Amer: 32 mL/min — ABNORMAL LOW (ref >60)
GLUCOSE: 126 mg/dL — AB (ref 65–99)
Potassium: 4.3 mmol/L (ref 3.5–5.1)
Sodium: 136 mmol/L (ref 135–145)
TOTAL PROTEIN: 5.9 g/dL — AB (ref 6.5–8.1)

## 2017-10-08 LAB — CBC WITH DIFFERENTIAL/PLATELET
BASOS ABS: 0.1 10*3/uL (ref 0.0–0.1)
BASOS PCT: 1 %
EOS ABS: 0.3 10*3/uL (ref 0.0–0.7)
Eosinophils Relative: 4 %
HCT: 37.2 % (ref 36.0–46.0)
HEMOGLOBIN: 11.7 g/dL — AB (ref 12.0–15.0)
Lymphocytes Relative: 13 %
Lymphs Abs: 1 10*3/uL (ref 0.7–4.0)
MCH: 33.5 pg (ref 26.0–34.0)
MCHC: 31.5 g/dL (ref 30.0–36.0)
MCV: 106.6 fL — ABNORMAL HIGH (ref 78.0–100.0)
Monocytes Absolute: 1.3 10*3/uL — ABNORMAL HIGH (ref 0.1–1.0)
Monocytes Relative: 17 %
NEUTROS ABS: 5.1 10*3/uL (ref 1.7–7.7)
Neutrophils Relative %: 65 %
Platelets: 119 10*3/uL — ABNORMAL LOW (ref 150–400)
RBC: 3.49 MIL/uL — AB (ref 3.87–5.11)
RDW: 19.9 % — ABNORMAL HIGH (ref 11.5–15.5)
WBC: 7.8 10*3/uL (ref 4.0–10.5)

## 2017-10-08 LAB — GLUCOSE, CAPILLARY: Glucose-Capillary: 151 mg/dL — ABNORMAL HIGH (ref 65–99)

## 2017-10-08 LAB — CBG MONITORING, ED
GLUCOSE-CAPILLARY: 121 mg/dL — AB (ref 65–99)
GLUCOSE-CAPILLARY: 151 mg/dL — AB (ref 65–99)
Glucose-Capillary: 175 mg/dL — ABNORMAL HIGH (ref 65–99)

## 2017-10-08 LAB — URINALYSIS, ROUTINE W REFLEX MICROSCOPIC
Bilirubin Urine: NEGATIVE
Glucose, UA: NEGATIVE mg/dL
Ketones, ur: NEGATIVE mg/dL
Leukocytes, UA: NEGATIVE
NITRITE: NEGATIVE
Protein, ur: NEGATIVE mg/dL
SPECIFIC GRAVITY, URINE: 1.019 (ref 1.005–1.030)
pH: 5 (ref 5.0–8.0)

## 2017-10-08 LAB — PROTIME-INR
INR: 1.48
Prothrombin Time: 17.8 seconds — ABNORMAL HIGH (ref 11.4–15.2)

## 2017-10-08 LAB — LACTIC ACID, PLASMA
LACTIC ACID, VENOUS: 2.5 mmol/L — AB (ref 0.5–1.9)
Lactic Acid, Venous: 2.2 mmol/L (ref 0.5–1.9)

## 2017-10-08 LAB — AMMONIA: AMMONIA: 33 umol/L (ref 9–35)

## 2017-10-08 LAB — CK: CK TOTAL: 103 U/L (ref 38–234)

## 2017-10-08 MED ORDER — ASPIRIN EC 81 MG PO TBEC
81.0000 mg | DELAYED_RELEASE_TABLET | Freq: Every day | ORAL | Status: DC
  Administered 2017-10-09 – 2017-10-11 (×3): 81 mg via ORAL
  Filled 2017-10-08 (×3): qty 1

## 2017-10-08 MED ORDER — LACTULOSE 10 GM/15ML PO SOLN
30.0000 g | Freq: Two times a day (BID) | ORAL | Status: DC
  Administered 2017-10-08 – 2017-10-10 (×5): 30 g via ORAL
  Administered 2017-10-11: 08:00:00 via ORAL
  Filled 2017-10-08 (×8): qty 60

## 2017-10-08 MED ORDER — TETANUS-DIPHTH-ACELL PERTUSSIS 5-2.5-18.5 LF-MCG/0.5 IM SUSP
0.5000 mL | Freq: Once | INTRAMUSCULAR | Status: AC
  Administered 2017-10-08: 0.5 mL via INTRAMUSCULAR
  Filled 2017-10-08: qty 0.5

## 2017-10-08 MED ORDER — ASPIRIN 81 MG PO TABS: 81.0000 mg | ORAL_TABLET | Freq: Every day | ORAL | Status: DC

## 2017-10-08 MED ORDER — SODIUM CHLORIDE 0.9 % IV SOLN: INTRAVENOUS | Status: AC

## 2017-10-08 MED ORDER — SODIUM CHLORIDE 0.9% FLUSH
3.0000 mL | Freq: Two times a day (BID) | INTRAVENOUS | Status: DC
  Administered 2017-10-09: 3 mL via INTRAVENOUS

## 2017-10-08 MED ORDER — SPIRONOLACTONE 25 MG PO TABS
12.5000 mg | ORAL_TABLET | Freq: Two times a day (BID) | ORAL | Status: DC
Start: 2017-10-08 — End: 2017-10-11
  Administered 2017-10-08 – 2017-10-11 (×6): 12.5 mg via ORAL
  Filled 2017-10-08 (×6): qty 1

## 2017-10-08 MED ORDER — SODIUM CHLORIDE 0.9 % IV SOLN
250.0000 mL | INTRAVENOUS | Status: DC | PRN
  Administered 2017-10-09: 250 mL via INTRAVENOUS

## 2017-10-08 MED ORDER — VENLAFAXINE HCL ER 75 MG PO CP24
150.0000 mg | ORAL_CAPSULE | Freq: Every day | ORAL | Status: DC
  Administered 2017-10-08 – 2017-10-11 (×4): 150 mg via ORAL
  Filled 2017-10-08 (×6): qty 2

## 2017-10-08 MED ORDER — ONDANSETRON HCL 4 MG PO TABS
4.0000 mg | ORAL_TABLET | Freq: Four times a day (QID) | ORAL | Status: DC | PRN
  Administered 2017-10-11: 4 mg via ORAL
  Filled 2017-10-08: qty 1

## 2017-10-08 MED ORDER — INSULIN GLARGINE 100 UNIT/ML ~~LOC~~ SOLN
8.0000 [IU] | Freq: Every day | SUBCUTANEOUS | Status: DC
  Administered 2017-10-09 – 2017-10-11 (×3): 8 [IU] via SUBCUTANEOUS
  Filled 2017-10-08 (×6): qty 0.08

## 2017-10-08 MED ORDER — PANTOPRAZOLE SODIUM 40 MG PO TBEC
40.0000 mg | DELAYED_RELEASE_TABLET | Freq: Every day | ORAL | Status: DC
  Administered 2017-10-08 – 2017-10-11 (×4): 40 mg via ORAL
  Filled 2017-10-08 (×4): qty 1

## 2017-10-08 MED ORDER — SODIUM CHLORIDE 0.9% FLUSH: 3.0000 mL | INTRAVENOUS | Status: DC | PRN

## 2017-10-08 MED ORDER — SODIUM CHLORIDE 0.9 % IV BOLUS (SEPSIS)
1000.0000 mL | Freq: Once | INTRAVENOUS | Status: AC
  Administered 2017-10-08: 1000 mL via INTRAVENOUS

## 2017-10-08 MED ORDER — VANCOMYCIN HCL 10 G IV SOLR
1250.0000 mg | INTRAVENOUS | Status: DC
  Administered 2017-10-09 – 2017-10-10 (×2): 1250 mg via INTRAVENOUS
  Filled 2017-10-08 (×5): qty 1250

## 2017-10-08 MED ORDER — INSULIN ASPART 100 UNIT/ML ~~LOC~~ SOLN
0.0000 [IU] | Freq: Three times a day (TID) | SUBCUTANEOUS | Status: DC
  Administered 2017-10-08 – 2017-10-09 (×2): 2 [IU] via SUBCUTANEOUS
  Administered 2017-10-09: 1 [IU] via SUBCUTANEOUS
  Administered 2017-10-09 – 2017-10-10 (×2): 2 [IU] via SUBCUTANEOUS
  Administered 2017-10-10: 3 [IU] via SUBCUTANEOUS
  Administered 2017-10-11 (×2): 1 [IU] via SUBCUTANEOUS
  Administered 2017-10-11: 2 [IU] via SUBCUTANEOUS
  Filled 2017-10-08: qty 1

## 2017-10-08 MED ORDER — ACETAMINOPHEN 325 MG PO TABS
650.0000 mg | ORAL_TABLET | Freq: Once | ORAL | Status: AC
  Administered 2017-10-08: 650 mg via ORAL
  Filled 2017-10-08: qty 2

## 2017-10-08 MED ORDER — PRAVASTATIN SODIUM 10 MG PO TABS
10.0000 mg | ORAL_TABLET | Freq: Every day | ORAL | Status: DC
  Administered 2017-10-08 – 2017-10-11 (×4): 10 mg via ORAL
  Filled 2017-10-08 (×4): qty 1

## 2017-10-08 MED ORDER — PROPRANOLOL HCL 20 MG PO TABS
20.0000 mg | ORAL_TABLET | Freq: Every day | ORAL | Status: DC
  Administered 2017-10-09 – 2017-10-11 (×3): 20 mg via ORAL
  Filled 2017-10-08 (×3): qty 1

## 2017-10-08 MED ORDER — INSULIN GLARGINE 100 UNITS/ML SOLOSTAR PEN: 8.0000 [IU] | PEN_INJECTOR | Freq: Every day | SUBCUTANEOUS | Status: DC

## 2017-10-08 MED ORDER — VANCOMYCIN HCL IN DEXTROSE 1-5 GM/200ML-% IV SOLN
1000.0000 mg | INTRAVENOUS | Status: AC
  Administered 2017-10-08 (×3): 1000 mg via INTRAVENOUS
  Filled 2017-10-08 (×2): qty 200

## 2017-10-08 MED ORDER — LEVOTHYROXINE SODIUM 25 MCG PO TABS
125.0000 ug | ORAL_TABLET | Freq: Every day | ORAL | Status: DC
  Administered 2017-10-09 – 2017-10-11 (×3): 125 ug via ORAL
  Filled 2017-10-08 (×3): qty 1

## 2017-10-08 MED ORDER — OXYCODONE HCL 5 MG PO TABS: 2.5000 mg | ORAL_TABLET | Freq: Four times a day (QID) | ORAL | Status: DC | PRN

## 2017-10-08 MED ORDER — ONDANSETRON HCL 4 MG/2ML IJ SOLN
4.0000 mg | Freq: Four times a day (QID) | INTRAMUSCULAR | Status: DC | PRN
  Filled 2017-10-08: qty 2

## 2017-10-08 MED ORDER — RIFAXIMIN 550 MG PO TABS
550.0000 mg | ORAL_TABLET | Freq: Two times a day (BID) | ORAL | Status: DC
  Administered 2017-10-08 – 2017-10-11 (×6): 550 mg via ORAL
  Filled 2017-10-08 (×6): qty 1

## 2017-10-08 NOTE — H&P (Signed)
History and Physical  Nancy GentileKacie S Bartha RUE:[id number]:[id number] DOB: 10-27-1946 DOA: 10/08/2017  Referring physician: Lincoln MaxinIdol PCP: Ignatius SpeckingVyas, Dhruv B, MD   Chief Complaint: confusion  HPI: Nancy Baldwin is a 71 y.o. female with chronic liver disease and cirrhosis, chronic diastolic congestive heart failure, anxiety disorder, hypertension, and history of lung cancer who reportedly has frequent falls at home and presented to the emergency department early this morning after a fall at home.  She apparently had been trying to get out of bed when she slid onto the floor and landed on her buttocks.  She reports that her husband was unable to get her up.  EMS brought her to the ED for treatment.  No acute fractures or injuries noted.  She was noted to have an elevated lactic acid level.  She was also noted to have cellulitis of the right lower extremity.  She does have chronic wounds on both lower extremities.  She was also noted to be slightly confused.  Her husband reported that this is normally her baseline but she does have waxing and waning periods of confusion.  She did have a normal ammonia level.  It is unclear if she is taking her lactulose regularly.  It is unclear if she is taking her other medications regularly.  The patient was noted to have normal blood pressure and vitals.  The patient is being admitted for cellulitis of the right lower extremity and also sepsis associated with right lower extremity infection.  The patient was also noted to be dehydrated and has an acute renal failure.  Review of Systems: Unable to fully obtain as patient is confused at this time.  Past Medical History:  Diagnosis Date  . Anxiety   . CHF (congestive heart failure) (HCC)   . Depression   . Hypertension   . Lung cancer [facility](HCC)    Past Surgical History:  Procedure Laterality Date  . ABDOMINAL HYSTERECTOMY    . TONSILLECTOMY AND ADENOIDECTOMY     Social History:  reports that she has quit smoking. she has never used  smokeless tobacco. She reports that she does not drink alcohol or use drugs.  Allergies  Allergen Reactions  . Amoxicillin Other (See Comments)    Unknown  . Codeine Other (See Comments)    Unknown  . Decongest-Aid [Pseudoephedrine] Other (See Comments)    Unknown  . Decongestant [Oxymetazoline] Other (See Comments)    Unknown  . Metformin And Related Other (See Comments)    Unkown  . Tricor [Fenofibrate] Other (See Comments)    Unknown    Family History  Problem Relation Age of Onset  . Lung cancer Mother   . Heart disease Father   . Kidney disease Father   . Heart attack Father     Prior to Admission medications   Medication Sig Start Date End Date Taking? Authorizing Provider  aspirin 81 MG tablet Take 81 mg by mouth daily.   Yes [provider]  furosemide (LASIX) 40 MG tablet Take 1 tablet (40 mg total) by mouth 2 (two) times daily. 06/19/17  Yes Elliot CousinFisher, Denise, MD  insulin glargine (LANTUS) 100 unit/mL SOPN Inject 0.15 mLs (15 Units total) into the skin daily before breakfast. 06/19/17  Yes Elliot CousinFisher, Denise, MD  lactulose (CHRONULAC) 10 GM/15ML solution Take 45 mLs (30 g total) by mouth 2 (two) times daily. 06/19/17  Yes Elliot CousinFisher, Denise, MD  levothyroxine (SYNTHROID, LEVOTHROID) 125 MCG tablet Take 1 tablet (125 mcg total) by mouth daily before  breakfast. 06/20/17  Yes Elliot Cousin, MD  potassium chloride (K-DUR) 10 MEQ tablet Take 1 tablet (10 mEq total) by mouth 2 (two) times daily. 06/19/17  Yes Elliot Cousin, MD  pravastatin (PRAVACHOL) 10 MG tablet Take by mouth daily.  06/11/17  Yes [provider]  propranolol (INDERAL) 20 MG tablet 20 mg daily.  06/11/17  Yes [provider]  rifaximin (XIFAXAN) 550 MG TABS tablet Take 1 tablet (550 mg total) by mouth 2 (two) times daily. 06/19/17  Yes Elliot Cousin, MD  spironolactone (ALDACTONE) 25 MG tablet Take 12.5 mg by mouth 2 (two) times daily.   Yes [provider]  SSD 1 % cream Apply 1 application  topically daily.  06/11/17  Yes [provider]  venlafaxine XR (EFFEXOR-XR) 150 MG 24 hr capsule Take 150 mg by mouth 2 (two) times daily.    Yes [provider]  haloperidol (HALDOL) 0.5 MG tablet Take 0.5 mg by mouth at bedtime. 09/10/17   [provider]  pantoprazole (PROTONIX) 40 MG tablet Take 1 tablet (40 mg total) by mouth daily. Patient not taking: Reported on 10/08/2017 06/20/17   Elliot Cousin, MD   Physical Exam: Vitals:   10/08/17 0834 10/08/17 0936 10/08/17 1000 10/08/17 1100  BP:  132/79 128/84 137/70  Pulse:  87 85   Resp: 20     Temp:      TempSrc:      SpO2:  100% 98%   Weight:      Height:         General exam: Moderately built and obese patient, lying comfortably supine on the gurney in no obvious distress.  She reports that she is in the hospital.  She is oriented to person and place.  She is not oriented to time.  She is oriented to situation.  Head, eyes and ENT: Nontraumatic and normocephalic. Pupils equally reacting to light and accommodation. Oral mucosa dry.  Neck: Supple.  Mild JVD, carotid bruit or thyromegaly.  Lymphatics: No lymphadenopathy.  Respiratory system: Clear to auscultation. No increased work of breathing.  Cardiovascular system: S1 and S2 heard, RRR.  Bilateral pedal edema noted-pitting.  Gastrointestinal system: Abdomen is obese, nondistended, soft and nontender. Normal bowel sounds heard. No organomegaly or masses appreciated.  Central nervous system: Alert and confused. No focal neurological deficits.  Extremities: Symmetric 5 x 5 power. Peripheral pulses symmetrically felt.  Multiple bullous lesions noted bilateral lower extremities.  Right lower extremity with large healing ulceration on the dorsal surface of the right foot.  Surrounding cellulitis with redness, heat and tenderness to palpation noted.  Normal pedal pulses bilateral lower extremities.  Skin: Erythema, heat and spreading areas of erythema  consistent with cellulitis of the right foot dorsal surface.  Musculoskeletal system: There is 2+ pitting edema bilateral lower extremities.  Psychiatry: Pleasant and cooperative.   Affect seems flat.  Labs on Admission:  Basic Metabolic Panel: Recent Labs  Lab 10/08/17 0818  NA 136  K 4.3  CL 102  CO2 25  GLUCOSE 126*  BUN 43*  CREATININE 1.57*  CALCIUM 9.3   Liver Function Tests: Recent Labs  Lab 10/08/17 0818  AST 159*  ALT 108*  ALKPHOS 414*  BILITOT 4.0*  PROT 5.9*  ALBUMIN 2.2*   No results for input(s): LIPASE, AMYLASE in the last 168 hours. Recent Labs  Lab 10/08/17 0818  AMMONIA 33   CBC: Recent Labs  Lab 10/08/17 0818  WBC 7.8  NEUTROABS 5.1  HGB 11.7*  HCT 37.2  MCV 106.6*  PLT 119*   Cardiac Enzymes: Recent Labs  Lab 10/08/17 0840  CKTOTAL 103    BNP (last 3 results) No results for input(s): PROBNP in the last 8760 hours. CBG: Recent Labs  Lab 10/08/17 0839  GLUCAP 121*    Radiological Exams on Admission: Dg Ribs Unilateral W/chest Left  Result Date: 10/08/2017 CLINICAL DATA:  Fall, left rib pain. EXAM: LEFT RIBS AND CHEST - 3+ VIEW COMPARISON:  Chest x-ray 08/16/2017 FINDINGS: Multiple left rib fractures are noted, involving at least ribs 4 through 8 laterally. At least some of these were noted on prior chest x-ray and prior CT from 06/12/2017. Lungs are clear. No effusions or pneumothorax. Heart is borderline in size. IMPRESSION: Multiple left lateral rib fractures, at least ribs 4 through 8. At least some with these were present on prior chest CT and chest x-rays dating back to 06/12/2017. No pneumothorax. Electronically Signed   By: Charlett Nose M.D.   On: 10/08/2017 09:28   Dg Thoracic Spine 2 View  Result Date: 10/08/2017 CLINICAL DATA:  Back pain after fall today. EXAM: THORACIC SPINE 2 VIEWS COMPARISON:  CT scan of June 12, 2017. FINDINGS: Status post kyphoplasty of T7 and T8. No acute fracture or spondylolisthesis is  noted. Disc spaces are well-maintained. Mild S-shaped scoliosis of lower thoracic spine is noted. IMPRESSION: Status post kyphoplasty of T7 and T8 vertebral bodies. No acute abnormality seen in the thoracic spine. Electronically Signed   By: Lupita Raider, M.D.   On: 10/08/2017 09:39   Dg Lumbar Spine Complete  Result Date: 10/08/2017 CLINICAL DATA:  Low back pain after fall today. EXAM: LUMBAR SPINE - COMPLETE 4+ VIEW COMPARISON:  CT scan of June 12, 2017. FINDINGS: Moderate dextroscoliosis of lumbar spine is noted. Stable old compression fractures of T12 and L1 vertebral bodies is noted. Severe degenerative disc disease is noted at L3-4. Osteopenia is noted. No acute fracture or spondylolisthesis is noted. IMPRESSION: Old T12 and L1 fractures. No acute abnormality seen in the lumbar spine. Electronically Signed   By: Lupita Raider, M.D.   On: 10/08/2017 09:41   Ct Head Wo Contrast  Result Date: 10/08/2017 CLINICAL DATA:  Status post fall.  Poor historian. EXAM: CT HEAD WITHOUT CONTRAST CT CERVICAL SPINE WITHOUT CONTRAST TECHNIQUE: Multidetector CT imaging of the head and cervical spine was performed following the standard protocol without intravenous contrast. Multiplanar CT image reconstructions of the cervical spine were also generated. COMPARISON:  None. FINDINGS: CT HEAD FINDINGS Brain: No evidence of acute infarction, hemorrhage, extra-axial collection, ventriculomegaly, or mass effect. Generalized cerebral atrophy. Periventricular white matter low attenuation likely secondary to microangiopathy. Vascular: Cerebrovascular atherosclerotic calcifications are noted. Skull: Negative for fracture or focal lesion. Sinuses/Orbits: Visualized portions of the orbits are unremarkable. Visualized portions of the paranasal sinuses and mastoid air cells are unremarkable. Other: None. CT CERVICAL SPINE FINDINGS Alignment: Normal. Skull base and vertebrae: No acute fracture. No primary bone lesion or focal  pathologic process. Soft tissues and spinal canal: No prevertebral fluid or swelling. No visible canal hematoma. Disc levels: Degenerative disc disease with disc height loss at C5-6, C6-7 C7-T1. Broad-based disc osteophyte complex at C5-6 and C6-7 impressing on the thecal sac. Bilateral uncovertebral degenerative changes at C6-7 with foraminal encroachment. Upper chest: Lung apices are clear. Other: No fluid collection or hematoma. IMPRESSION: 1. No acute intracranial pathology. 2. No acute osseous injury of the cervical spine. Electronically Signed   By: Elige Ko  On: 10/08/2017 09:49   Ct Cervical Spine Wo Contrast  Result Date: 10/08/2017 CLINICAL DATA:  Status post fall.  Poor historian. EXAM: CT HEAD WITHOUT CONTRAST CT CERVICAL SPINE WITHOUT CONTRAST TECHNIQUE: Multidetector CT imaging of the head and cervical spine was performed following the standard protocol without intravenous contrast. Multiplanar CT image reconstructions of the cervical spine were also generated. COMPARISON:  None. FINDINGS: CT HEAD FINDINGS Brain: No evidence of acute infarction, hemorrhage, extra-axial collection, ventriculomegaly, or mass effect. Generalized cerebral atrophy. Periventricular white matter low attenuation likely secondary to microangiopathy. Vascular: Cerebrovascular atherosclerotic calcifications are noted. Skull: Negative for fracture or focal lesion. Sinuses/Orbits: Visualized portions of the orbits are unremarkable. Visualized portions of the paranasal sinuses and mastoid air cells are unremarkable. Other: None. CT CERVICAL SPINE FINDINGS Alignment: Normal. Skull base and vertebrae: No acute fracture. No primary bone lesion or focal pathologic process. Soft tissues and spinal canal: No prevertebral fluid or swelling. No visible canal hematoma. Disc levels: Degenerative disc disease with disc height loss at C5-6, C6-7 C7-T1. Broad-based disc osteophyte complex at C5-6 and C6-7 impressing on the thecal sac.  Bilateral uncovertebral degenerative changes at C6-7 with foraminal encroachment. Upper chest: Lung apices are clear. Other: No fluid collection or hematoma. IMPRESSION: 1. No acute intracranial pathology. 2. No acute osseous injury of the cervical spine. Electronically Signed   By: Elige Ko   On: 10/08/2017 09:49   Dg Foot Complete Right  Result Date: 10/08/2017 CLINICAL DATA:  Right foot injury due to a fall getting out of bed this morning. Initial encounter. EXAM: RIGHT FOOT COMPLETE - 3+ VIEW COMPARISON:  None. FINDINGS: No acute bony or joint abnormality is identified. Well corticated bone fragment off the dorsal margin of the neck of the talus is most consistent with remote injury. Soft tissues about the foot appear somewhat swollen. IMPRESSION: Soft tissue swelling without acute bony abnormality. Small well corticated bone fragment off the dorsal neck of the talus is likely due to remote injury. Electronically Signed   By: Drusilla Kanner M.D.   On: 10/08/2017 09:34   Dg Hips Bilat W Or Wo Pelvis 3-4 Views  Result Date: 10/08/2017 CLINICAL DATA:  Hip pain after fall this morning. EXAM: DG HIP (WITH OR WITHOUT PELVIS) 3-4V BILAT COMPARISON:  None. FINDINGS: There is no evidence of hip fracture or dislocation. There is no evidence of arthropathy or other focal bone abnormality. IMPRESSION: Normal bilateral hips. Electronically Signed   By: Lupita Raider, M.D.   On: 10/08/2017 09:32    EKG: Pending  Assessment/Plan Principal Problem:   Sepsis (HCC) Active Problems:   Elevated lactic acid level   Altered mental status, unspecified   AKI (acute kidney injury) (HCC)   Diastolic CHF (HCC)   Cirrhosis (HCC)   Hypothyroidism   Type 2 diabetes mellitus with other specified complication (HCC)   Thrombocytopenia (HCC)   Cellulitis of right foot   Falls frequently   Dehydration   Hypertension  1. Sepsis-secondary to cellulitis of right lower extremity-admit for IV antibiotics.   Supportive therapy with IV fluids.  Trend lactic acid.  Monitor hemodynamics.  Follow blood cultures. 2. Cellulitis of the right lower extremity-we will treat with IV vancomycin. 3. Acute kidney injury-prerenal secondary to dehydration, treating with gentle IV fluid hydration.  Follow BMP. 4. Altered mental status-likely multifactorial but do not believe she is experiencing hepatic encephalopathy at this time.  She does have a normal ammonia level.  Neurochecks ordered.  Treating metabolic condition as  noted above.  Continue supportive therapy.  Continue lactulose therapy. 5. Diastolic congestive heart failure-compensated.  Gentle IV fluid hydration ordered. 6. Cirrhosis-secondary to hepatic steatosis-resume home liver medications. 7. 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  Follow CBC.  SCDs for DVT prophylaxis.  Avoiding heparin. 8. Mild dehydration-holding Lasix temporarily.  Gentle IV fluid hydration as noted above. 9. Essential hypertension-resuming home blood pressure medications and following. 10. Type 2 diabetes mellitus- reducing home Lantus dose, sensitive sliding scale coverage ordered.  Monitor blood glucose closely. 11. Hypothyroidism-stable resume home thyroid supplement. 12. Frequent falls-we will consult PT for evaluation.  Fall precautions have been ordered.  I suspect patient will need skilled nursing rehab placement. 13. Anemia, unspecified - Follow CBC.  14. Elevated liver enzymes - secondary to hepatic steatosis and cirrhosis.  Following.     DVT Prophylaxis: SCDs Code Status: Full   Family Communication: husband  Disposition Plan: SNF   Time spent: 66 mins  Standley Dakinslanford Johnson, MD [facility] Pager [phone]  If 7PM-7AM, please contact night-coverage [email or web address] Password [email or web address] 10/08/2017, 11:32 AM

## 2017-10-08 NOTE — ED Notes (Signed)
CBG 151. 

## 2017-10-08 NOTE — ED Notes (Signed)
Pt en route to floor at this time.

## 2017-10-08 NOTE — ED Notes (Signed)
CRITICAL VALUE ALERT  Critical Value:  Lactic 2.5  Date & Time Notied:  1012   Provider Notified: yes  Orders Received/Actions taken:

## 2017-10-08 NOTE — ED Notes (Signed)
Report given to cindy, rn

## 2017-10-08 NOTE — ED Notes (Signed)
Pt transported to Xray. 

## 2017-10-08 NOTE — ED Provider Notes (Signed)
[facility] EMERGENCY DEPARTMENT Provider Note   CSN: [id number] Arrival date & time: 10/08/17  0756     History   Chief Complaint Chief Complaint  Patient presents with  . Fall  . Wound Check    Level 5 Caveat - pt poor history giver.  HPI Nancy Baldwin is a 71 y.o. female with a history of CHF, htn, DM, hepatic encephalopathy, hypothyroidism and frequent falls presenting with a fall which occurred prior to arrival here.  She describes was trying to get out of bed when her feet slid out from under her, landing on her buttocks on the floor next to her bed.  Her husband was unable to get her up, so ems arrived about 30 minutes later and assisted getting her up. She reports "hurting all over", most acutely at her buttocks and pelvis, but still endorses pain in her left rib cage since a fall in September (admitted here) resulted in 4 left rib fractures and a T12 and L1 compression fracture.  She was admitted for rehab for these injuries but has since returned home.  She denies loc, denies n/v, headache or other complaint except "hurting all over". Pt appears to be a poor history giver.  She cannot recall her last blood glucose check and other simple health related questions.  The history is provided by the patient, the EMS personnel and medical records.    Past Medical History:  Diagnosis Date  . Anxiety   . CHF (congestive heart failure) (HCC)   . Depression   . Hypertension   . Lung cancer [facility])     Patient Active Problem List   Diagnosis Date Noted  . Type 2 diabetes mellitus with other specified complication (HCC) 06/18/2017  . Thrombocytopenia (HCC) 06/18/2017  . Spinal compression fracture, initial encounter   . Acute hepatic encephalopathy   . Multiple closed fractures of ribs of left side   . T12 compression fracture (HCC) 06/13/2017  . 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 Acute diastolic CHF (congestive heart failure)  (HCC) 06/13/2017  . Intractable pain 06/12/2017  . Diastolic CHF (HCC) 02/07/2014  . Pneumonia 02/07/2014  . Cirrhosis (HCC) 02/07/2014  . Hypothyroidism 02/07/2014  . Anxiety state, unspecified 02/07/2014  . Depression 02/07/2014    Past Surgical History:  Procedure Laterality Date  . ABDOMINAL HYSTERECTOMY    . TONSILLECTOMY AND ADENOIDECTOMY      OB History    No data available       Home Medications    Prior to Admission medications   Medication Sig Start Date End Date Taking? Authorizing Provider  aspirin 81 MG tablet Take 81 mg by mouth daily.    [provider]  furosemide (LASIX) 40 MG tablet Take 1 tablet (40 mg total) by mouth 2 (two) times daily. 06/19/17   Elliot Cousin, MD  HUMALOG KWIKPEN 100 UNIT/ML KiwkPen INJECT 12 UNITS SUBCUTANEOUSLY TWICE DAILY. 05/12/17   [provider]  insulin glargine (LANTUS) 100 unit/mL SOPN Inject 0.15 mLs (15 Units total) into the skin daily before breakfast. 06/19/17   Elliot Cousin, MD  lactulose (CHRONULAC) 10 GM/15ML solution Take 45 mLs (30 g total) by mouth 2 (two) times daily. 06/19/17   Elliot Cousin, MD  levothyroxine (SYNTHROID, LEVOTHROID) 125 MCG tablet Take 1 tablet (125 mcg total) by mouth daily before breakfast. 06/20/17   Elliot Cousin, MD  pantoprazole (PROTONIX) 40 MG tablet Take 1 tablet (40 mg total) by mouth daily.  06/20/17   Elliot Cousin, MD  potassium chloride (K-DUR) 10 MEQ tablet Take 1 tablet (10 mEq total) by mouth 2 (two) times daily. 06/19/17   Elliot Cousin, MD  pravastatin (PRAVACHOL) 10 MG tablet Take by mouth daily.  06/11/17   [provider]  propranolol (INDERAL) 20 MG tablet 20 mg 2 (two) times daily.  06/11/17   [provider]  rifaximin (XIFAXAN) 550 MG TABS tablet Take 1 tablet (550 mg total) by mouth 2 (two) times daily. 06/19/17   Elliot Cousin, MD  spironolactone (ALDACTONE) 25 MG tablet Take 12.5 mg by mouth 2 (two) times daily.    [provider]  SSD 1  % cream Apply 1 application topically daily.  06/11/17   [provider]  traMADol (ULTRAM) 50 MG tablet Take 1 tablet (50 mg total) by mouth every 6 (six) hours as needed for severe pain. 06/19/17   Elliot Cousin, MD  venlafaxine XR (EFFEXOR-XR) 150 MG 24 hr capsule Take 150 mg by mouth 2 (two) times daily.     [provider]    Family History Family History  Problem Relation Age of Onset  . Lung cancer Mother   . Heart disease Father   . Kidney disease Father   . Heart attack Father     Social History Social History   Tobacco Use  . Smoking status: Former Games developer  . Smokeless tobacco: Never Used  Substance Use Topics  . Alcohol use: No  . Drug use: No     Allergies   Amoxicillin; Codeine; Decongest-aid [pseudoephedrine]; Decongestant [oxymetazoline]; Metformin and related; and Tricor [fenofibrate]   Review of Systems Review of Systems  Unable to perform ROS: Mental status change (Pt appears confused, unable to answer numerous questions. )  Musculoskeletal: Positive for arthralgias. Negative for neck pain.  Skin: Positive for color change and rash.  Neurological: Negative for dizziness and headaches.     Physical Exam Updated Vital Signs BP 128/84   Pulse 85   Temp (!) 97.5 F (36.4 C) (Oral)   Resp 20   Ht 5\' 7"  (1.702 m)   Wt 108.9 kg (240 lb)   SpO2 98%   BMI 37.59 kg/m   Physical Exam  Constitutional: She appears well-developed and well-nourished.  Obese female.   HENT:  Head: Normocephalic and atraumatic.  Eyes: Conjunctivae are normal.  Cardiovascular: Normal rate, regular rhythm and normal heart sounds.  Trace foot and ankle edema.  Pulmonary/Chest: Effort normal and breath sounds normal. No respiratory distress. She has no wheezes.  Abdominal: Soft. Bowel sounds are normal. There is no tenderness. There is no guarding.  Musculoskeletal: She exhibits tenderness.  TTP right dorsal foot at site of erythema.  Neurological: She is  alert. No sensory deficit.  No focal neuro deficits.  Pt moves as 4 extremities.   Skin: Skin is warm and dry.  Erythema right dorsal foot with multiple clear filled vesicles of ankle and lower legs.  Large blood filled bulla right metatarsal, ulcerative wound right dorsal foot. Moderate painful erythema right dorsal foot.  Psychiatric: Her mood appears anxious. She is slowed.  Nursing note and vitals reviewed.    ED Treatments / Results  Labs (all labs ordered are listed, but only abnormal results are displayed) Labs Reviewed  COMPREHENSIVE METABOLIC PANEL - Abnormal; Notable for the following components:      Result Value   Glucose, Bld 126 (*)    BUN 43 (*)    Creatinine, Ser 1.57 (*)  Total Protein 5.9 (*)    Albumin 2.2 (*)    AST 159 (*)    ALT 108 (*)    Alkaline Phosphatase 414 (*)    Total Bilirubin 4.0 (*)    GFR calc non Af Amer 32 (*)    GFR calc Af Amer 37 (*)    All other components within normal limits  CBC WITH DIFFERENTIAL/PLATELET - Abnormal; Notable for the following components:   RBC 3.49 (*)    Hemoglobin 11.7 (*)    MCV 106.6 (*)    RDW 19.9 (*)    Platelets 119 (*)    Monocytes Absolute 1.3 (*)    All other components within normal limits  PROTIME-INR - Abnormal; Notable for the following components:   Prothrombin Time 17.8 (*)    All other components within normal limits  LACTIC ACID, PLASMA - Abnormal; Notable for the following components:   Lactic Acid, Venous 2.5 (*)    All other components within normal limits  CBG MONITORING, ED - Abnormal; Notable for the following components:   Glucose-Capillary 121 (*)    All other components within normal limits  CULTURE, BLOOD (ROUTINE X 2)  CULTURE, BLOOD (ROUTINE X 2)  AMMONIA  CK  URINALYSIS, ROUTINE W REFLEX MICROSCOPIC  LACTIC ACID, PLASMA    EKG  EKG Interpretation None       Radiology Dg Ribs Unilateral W/chest Left  Result Date: 10/08/2017 CLINICAL DATA:  Fall, left rib pain.  EXAM: LEFT RIBS AND CHEST - 3+ VIEW COMPARISON:  Chest x-ray 08/16/2017 FINDINGS: Multiple left rib fractures are noted, involving at least ribs 4 through 8 laterally. At least some of these were noted on prior chest x-ray and prior CT from 06/12/2017. Lungs are clear. No effusions or pneumothorax. Heart is borderline in size. IMPRESSION: Multiple left lateral rib fractures, at least ribs 4 through 8. At least some with these were present on prior chest CT and chest x-rays dating back to 06/12/2017. No pneumothorax. Electronically Signed   By: Charlett NoseKevin  Dover M.D.   On: 10/08/2017 09:28   Dg Thoracic Spine 2 View  Result Date: 10/08/2017 CLINICAL DATA:  Back pain after fall today. EXAM: THORACIC SPINE 2 VIEWS COMPARISON:  CT scan of June 12, 2017. FINDINGS: Status post kyphoplasty of T7 and T8. No acute fracture or spondylolisthesis is noted. Disc spaces are well-maintained. Mild S-shaped scoliosis of lower thoracic spine is noted. IMPRESSION: Status post kyphoplasty of T7 and T8 vertebral bodies. No acute abnormality seen in the thoracic spine. Electronically Signed   By: Lupita RaiderJames  Green Jr, M.D.   On: 10/08/2017 09:39   Dg Lumbar Spine Complete  Result Date: 10/08/2017 CLINICAL DATA:  Low back pain after fall today. EXAM: LUMBAR SPINE - COMPLETE 4+ VIEW COMPARISON:  CT scan of June 12, 2017. FINDINGS: Moderate dextroscoliosis of lumbar spine is noted. Stable old compression fractures of T12 and L1 vertebral bodies is noted. Severe degenerative disc disease is noted at L3-4. Osteopenia is noted. No acute fracture or spondylolisthesis is noted. IMPRESSION: Old T12 and L1 fractures. No acute abnormality seen in the lumbar spine. Electronically Signed   By: Lupita RaiderJames  Green Jr, M.D.   On: 10/08/2017 09:41   Ct Head Wo Contrast  Result Date: 10/08/2017 CLINICAL DATA:  Status post fall.  Poor historian. EXAM: CT HEAD WITHOUT CONTRAST CT CERVICAL SPINE WITHOUT CONTRAST TECHNIQUE: Multidetector CT imaging of the  head and cervical spine was performed following the standard protocol without intravenous contrast. Multiplanar  CT image reconstructions of the cervical spine were also generated. COMPARISON:  None. FINDINGS: CT HEAD FINDINGS Brain: No evidence of acute infarction, hemorrhage, extra-axial collection, ventriculomegaly, or mass effect. Generalized cerebral atrophy. Periventricular white matter low attenuation likely secondary to microangiopathy. Vascular: Cerebrovascular atherosclerotic calcifications are noted. Skull: Negative for fracture or focal lesion. Sinuses/Orbits: Visualized portions of the orbits are unremarkable. Visualized portions of the paranasal sinuses and mastoid air cells are unremarkable. Other: None. CT CERVICAL SPINE FINDINGS Alignment: Normal. Skull base and vertebrae: No acute fracture. No primary bone lesion or focal pathologic process. Soft tissues and spinal canal: No prevertebral fluid or swelling. No visible canal hematoma. Disc levels: Degenerative disc disease with disc height loss at C5-6, C6-7 C7-T1. Broad-based disc osteophyte complex at C5-6 and C6-7 impressing on the thecal sac. Bilateral uncovertebral degenerative changes at C6-7 with foraminal encroachment. Upper chest: Lung apices are clear. Other: No fluid collection or hematoma. IMPRESSION: 1. No acute intracranial pathology. 2. No acute osseous injury of the cervical spine. Electronically Signed   By: Elige KoHetal  Patel   On: 10/08/2017 09:49   Ct Cervical Spine Wo Contrast  Result Date: 10/08/2017 CLINICAL DATA:  Status post fall.  Poor historian. EXAM: CT HEAD WITHOUT CONTRAST CT CERVICAL SPINE WITHOUT CONTRAST TECHNIQUE: Multidetector CT imaging of the head and cervical spine was performed following the standard protocol without intravenous contrast. Multiplanar CT image reconstructions of the cervical spine were also generated. COMPARISON:  None. FINDINGS: CT HEAD FINDINGS Brain: No evidence of acute infarction, hemorrhage,  extra-axial collection, ventriculomegaly, or mass effect. Generalized cerebral atrophy. Periventricular white matter low attenuation likely secondary to microangiopathy. Vascular: Cerebrovascular atherosclerotic calcifications are noted. Skull: Negative for fracture or focal lesion. Sinuses/Orbits: Visualized portions of the orbits are unremarkable. Visualized portions of the paranasal sinuses and mastoid air cells are unremarkable. Other: None. CT CERVICAL SPINE FINDINGS Alignment: Normal. Skull base and vertebrae: No acute fracture. No primary bone lesion or focal pathologic process. Soft tissues and spinal canal: No prevertebral fluid or swelling. No visible canal hematoma. Disc levels: Degenerative disc disease with disc height loss at C5-6, C6-7 C7-T1. Broad-based disc osteophyte complex at C5-6 and C6-7 impressing on the thecal sac. Bilateral uncovertebral degenerative changes at C6-7 with foraminal encroachment. Upper chest: Lung apices are clear. Other: No fluid collection or hematoma. IMPRESSION: 1. No acute intracranial pathology. 2. No acute osseous injury of the cervical spine. Electronically Signed   By: Elige KoHetal  Patel   On: 10/08/2017 09:49   Dg Foot Complete Right  Result Date: 10/08/2017 CLINICAL DATA:  Right foot injury due to a fall getting out of bed this morning. Initial encounter. EXAM: RIGHT FOOT COMPLETE - 3+ VIEW COMPARISON:  None. FINDINGS: No acute bony or joint abnormality is identified. Well corticated bone fragment off the dorsal margin of the neck of the talus is most consistent with remote injury. Soft tissues about the foot appear somewhat swollen. IMPRESSION: Soft tissue swelling without acute bony abnormality. Small well corticated bone fragment off the dorsal neck of the talus is likely due to remote injury. Electronically Signed   By: Drusilla Kannerhomas  Dalessio M.D.   On: 10/08/2017 09:34   Dg Hips Bilat W Or Wo Pelvis 3-4 Views  Result Date: 10/08/2017 CLINICAL DATA:  Hip pain  after fall this morning. EXAM: DG HIP (WITH OR WITHOUT PELVIS) 3-4V BILAT COMPARISON:  None. FINDINGS: There is no evidence of hip fracture or dislocation. There is no evidence of arthropathy or other focal bone abnormality. IMPRESSION: Normal  bilateral hips. Electronically Signed   By: Lupita Raider, M.D.   On: 10/08/2017 09:32    Procedures Procedures (including critical care time)  Medications Ordered in ED Medications  vancomycin (VANCOCIN) IVPB 1000 mg/200 mL premix (1,000 mg Intravenous New Bag/Given 10/08/17 0950)  vancomycin (VANCOCIN) 1,250 mg in sodium chloride 0.9 % 250 mL IVPB (not administered)  sodium chloride 0.9 % bolus 1,000 mL (not administered)  acetaminophen (TYLENOL) tablet 650 mg (650 mg Oral Given 10/08/17 0938)  Tdap (BOOSTRIX) injection 0.5 mL (0.5 mLs Intramuscular Given 10/08/17 1024)     Initial Impression / Assessment and Plan / ED Course  I have reviewed the triage vital signs and the nursing notes.  Pertinent labs & imaging results that were available during my care of the patient were reviewed by me and considered in my medical decision making (see chart for details).     Spoke with pt's husband who reports she has intermittent confusion since she was dc'd from her rehab center early November. Reports she was not given refills for her lactulose at that time.  Her confusion today is no worse than typical confusion.  Foot wound has been chronic for months, improved last week although chronic ulcer unchanged. Redness has flared up the past week.  She does have [facility] providing wound care, last dressed yesterday.    There is no chart evidence that patient is up-to-date with her tetanus.  She was provided this today given her foot wound.  Labs reviewed, patient with an elevated lactate suggesting sepsis.  IV fluids were initiated, had also received IV fluids with infusion of vancomycin which was ordered for initial treatment of her cellulitis foot  infection.  Her LFTs are elevated today, she has a history of cirrhosis, her ammonia level is normal range today.  She will need admission for treatment of this infection and further evaluation of her intermittent confusion, this does not appear to be cirrhosis related today.  Discussed with Dr Laural Benes who accepts admission.  Final Clinical Impressions(s) / ED Diagnoses   Final diagnoses:  Cellulitis of foot  Fall, initial encounter  Dehydration  Elevated liver enzymes  Sepsis, due to unspecified organism [facility])    ED Discharge Orders    None       Victoriano Lain 10/08/17 1119    Doug Sou, MD 10/08/17 1400

## 2017-10-08 NOTE — ED Triage Notes (Signed)
Pt diabetic, wound noted to the right foot.

## 2017-10-08 NOTE — ED Notes (Signed)
CRITICAL VALUE ALERT  Critical Value:  Lactic acid 2.2  Date & Time Notied:  11:51  Provider Notified: Dr Dione BoozeJacobawitz   Orders Received/Actions taken: no now orders

## 2017-10-08 NOTE — ED Provider Notes (Signed)
Level 5 caveat patient disoriented she reports that she fell from bed this morning.  She complains of "pain all over" pain is worse at her right foot which she reports she has had for the past 1 month.  On exam patient is chronically ill-appearing HEENT exam mucous membranes dry, normocephalic atraumatic.  No facial asymmetry neck supple lungs clear to auscultation.  Abdomen obese, nontender.  Pelvis stable nontender.  Right lower extremity reddened at dorsum of foot with 3 cm scabbed lesion at dorsum of foot.  All other extremities are redness swelling or tenderness neurovascular intact.  She has limited range of motion of right arm secondary to old fracture.  Neurologic oriented to name and hospital does not know the year cranial nerves II through XII intact.  Moves all extremities no asterixis   Doug SouJacubowitz, Khalis Hittle, MD 10/08/17 [phone]

## 2017-10-08 NOTE — Progress Notes (Signed)
ANTIBIOTIC CONSULT NOTE - INITIAL  Pharmacy Consult for vancomycin Indication: cellulitis  Allergies  Allergen Reactions  . Amoxicillin Other (See Comments)    Unknown  . Codeine Other (See Comments)    Unknown  . Decongest-Aid [Pseudoephedrine] Other (See Comments)    Unknown  . Decongestant [Oxymetazoline] Other (See Comments)    Unknown  . Metformin And Related Other (See Comments)    Unkown  . Tricor [Fenofibrate] Other (See Comments)    Unknown    Patient Measurements: Height: 5\' 7"  (170.2 cm) Weight: 240 lb (108.9 kg) IBW/kg (Calculated) : 61.6   Vital Signs: Temp: 97.5 F (36.4 C) (11/28 0801) Temp Source: Oral (11/28 0801) BP: 132/79 (11/28 0936) Pulse Rate: 87 (11/28 0936) Intake/Output from previous day: No intake/output data recorded. Intake/Output from this shift: No intake/output data recorded.  Labs: Recent Labs    10/08/17 0818  WBC 7.8  HGB 11.7*  PLT 119*  CREATININE 1.57*   Estimated Creatinine Clearance: 42.4 mL/min (A) (by C-G formula based on SCr of 1.57 mg/dL (H)). No results for input(s): VANCOTROUGH, VANCOPEAK, VANCORANDOM, GENTTROUGH, GENTPEAK, GENTRANDOM, TOBRATROUGH, TOBRAPEAK, TOBRARND, AMIKACINPEAK, AMIKACINTROU, AMIKACIN in the last 72 hours.   Microbiology: No results found for this or any previous visit (from the past 720 hour(s)).  Medical History: Past Medical History:  Diagnosis Date  . Anxiety   . CHF (congestive heart failure) (HCC)   . Depression   . Hypertension   . Lung cancer (HCC)     Medications:  See medication history Assessment: 71 yo lady to start vancomycin for wound on foot. She has DM and has been receiving dressing changes from home care.  Goal of Therapy:  Vancomycin trough level 10-15 mcg/ml  Plan:  Vancomycin 2gm load then 1250 mg IV q24 hours F/u renal function, cultures and clinical progress  Nathifa Ritthaler Poteet 10/08/2017,10:16 AM

## 2017-10-08 NOTE — ED Triage Notes (Signed)
Pt brought by EMS presenting s/p fall out of bed and fell onto floor. Pt living at home with son, pt poor historian. Fall was unwitnessed and down time was approximately 30 minutes. Pt c/o chronic right shoulder pain.

## 2017-10-09 DIAGNOSIS — A419 Sepsis, unspecified organism: Principal | ICD-10-CM

## 2017-10-09 DIAGNOSIS — L03115 Cellulitis of right lower limb: Secondary | ICD-10-CM

## 2017-10-09 DIAGNOSIS — N179 Acute kidney failure, unspecified: Secondary | ICD-10-CM

## 2017-10-09 LAB — COMPREHENSIVE METABOLIC PANEL
ALBUMIN: 2.1 g/dL — AB (ref 3.5–5.0)
ALK PHOS: 386 U/L — AB (ref 38–126)
ALT: 106 U/L — AB (ref 14–54)
AST: 168 U/L — AB (ref 15–41)
Anion gap: 7 (ref 5–15)
BUN: 45 mg/dL — ABNORMAL HIGH (ref 6–20)
CALCIUM: 8.8 mg/dL — AB (ref 8.9–10.3)
CO2: 25 mmol/L (ref 22–32)
CREATININE: 1.74 mg/dL — AB (ref 0.44–1.00)
Chloride: 103 mmol/L (ref 101–111)
GFR calc Af Amer: 33 mL/min — ABNORMAL LOW (ref >60)
GFR calc non Af Amer: 29 mL/min — ABNORMAL LOW (ref >60)
GLUCOSE: 172 mg/dL — AB (ref 65–99)
Potassium: 4.5 mmol/L (ref 3.5–5.1)
SODIUM: 135 mmol/L (ref 135–145)
Total Bilirubin: 3.9 mg/dL — ABNORMAL HIGH (ref 0.3–1.2)
Total Protein: 5.7 g/dL — ABNORMAL LOW (ref 6.5–8.1)

## 2017-10-09 LAB — GLUCOSE, CAPILLARY
GLUCOSE-CAPILLARY: 154 mg/dL — AB (ref 65–99)
GLUCOSE-CAPILLARY: 192 mg/dL — AB (ref 65–99)
GLUCOSE-CAPILLARY: 148 mg/dL — AB (ref 65–99)
GLUCOSE-CAPILLARY: 183 mg/dL — AB (ref 65–99)

## 2017-10-09 LAB — CBC WITH DIFFERENTIAL/PLATELET
Basophils Absolute: 0.1 10*3/uL (ref 0.0–0.1)
Basophils Relative: 1 %
EOS ABS: 0.3 10*3/uL (ref 0.0–0.7)
EOS PCT: 4 %
HCT: 35.2 % — ABNORMAL LOW (ref 36.0–46.0)
HEMOGLOBIN: 11.1 g/dL — AB (ref 12.0–15.0)
LYMPHS ABS: 1.5 10*3/uL (ref 0.7–4.0)
LYMPHS PCT: 19 %
MCH: 34 pg (ref 26.0–34.0)
MCHC: 31.5 g/dL (ref 30.0–36.0)
MCV: 108 fL — AB (ref 78.0–100.0)
MONOS PCT: 18 %
Monocytes Absolute: 1.5 10*3/uL — ABNORMAL HIGH (ref 0.1–1.0)
NEUTROS PCT: 58 %
Neutro Abs: 4.7 10*3/uL (ref 1.7–7.7)
Platelets: 122 10*3/uL — ABNORMAL LOW (ref 150–400)
RBC: 3.26 MIL/uL — AB (ref 3.87–5.11)
RDW: 19.9 % — ABNORMAL HIGH (ref 11.5–15.5)
WBC: 8 10*3/uL (ref 4.0–10.5)

## 2017-10-09 LAB — MAGNESIUM: Magnesium: 2.3 mg/dL (ref 1.7–2.4)

## 2017-10-09 MED ORDER — COLLAGENASE 250 UNIT/GM EX OINT
TOPICAL_OINTMENT | Freq: Every day | CUTANEOUS | Status: DC
  Administered 2017-10-09 – 2017-10-11 (×3): via TOPICAL
  Filled 2017-10-09: qty 30

## 2017-10-09 MED ORDER — SODIUM CHLORIDE 0.9 % IV SOLN
INTRAVENOUS | Status: AC
  Administered 2017-10-09: 19:00:00 via INTRAVENOUS

## 2017-10-09 NOTE — Evaluation (Signed)
Physical Therapy Evaluation Patient Details Name: Nancy Baldwin MRN: [id number] DOB: 1946-08-30 Today's Date: 10/09/2017   History of Present Illness  Nancy Baldwin is a 71 y.o. female with chronic liver disease and cirrhosis, chronic diastolic congestive heart failure, anxiety disorder, hypertension, and history of lung cancer who reportedly has frequent falls at home and presented to the emergency department early this morning after a fall at home.  She apparently had been trying to get out of bed when she slid onto the floor and landed on her buttocks.  She reports that her husband was unable to get her up.  EMS brought her to the ED for treatment.  No acute fractures or injuries noted.  She was noted to have an elevated lactic acid level.  She was also noted to have cellulitis of the right lower extremity.  She does have chronic wounds on both lower extremities.  She was also noted to be slightly confused.  Her husband reported that this is normally her baseline but she does have waxing and waning periods of confusion.  She did have a normal ammonia level.  It is unclear if she is taking her lactulose regularly.  It is unclear if she is taking her other medications regularly.  The patient was noted to have normal blood pressure and vitals.  The patient is being admitted for cellulitis of the right lower extremity and also sepsis associated with right lower extremity infection.  The patient was also noted to be dehydrated and has an acute renal failure.  Clinical Impression  Patient received in bed with HOB elevated, pleasant and willing to work with skilled PT services; she reports that she uses a walker at home and is generally able to get around her house, but uses a WC outside of the home, also that it is difficult to navigate her stairs at home. Noted multiple wounds on B LEs, most markedly on RLE including wound on dorsum of R foot 100% slough, blood blister R foot, multiple potentially problematic  callouses on both feet, and weeping of wounds. Patient requires Max assist/Mod verbal cuing for supine to sit today, Min-Mod verbal cues to remain sitting upright at EOB with S; she demonstrates significant weakness in LEs as evidenced by MMT however note some difficulty in following MMT commands. She did desaturate to 88% O2 on room air, able to return saturations to 93% with Max cues for pursed lip breathing however patient limited by fatigue this session. RN aware of wounds and that patient will need external catheter placed back on; RN also aware of O2 desaturation during session. Patient left sitting in bed with HOB elevated, bed alarm activated, and all needs otherwise met. Recommend SNF care moving forward.     Follow Up Recommendations SNF    Equipment Recommendations  None recommended by PT    Recommendations for Other Services       Precautions / Restrictions Precautions Precautions: Fall Restrictions Weight Bearing Restrictions: No      Mobility  Bed Mobility Overal bed mobility: Needs Assistance Bed Mobility: Supine to Sit;Sit to Supine     Supine to sit: Max assist Sit to supine: Min assist   General bed mobility comments: mod-max assist for scooting up in bed in trendelenburg position   Transfers                 General transfer comment: DNT   Ambulation/Gait             General  Gait Details: DNT   Stairs            Wheelchair Mobility    Modified Rankin (Stroke Patients Only)       Balance Overall balance assessment: History of Falls;Needs assistance Sitting-balance support: Bilateral upper extremity supported Sitting balance-Leahy Scale: Fair         Standing balance comment: DNT standing balance but general educated impression would be fair to poor standing balance                              Pertinent Vitals/Pain Pain Assessment: 0-10 Pain Score: 5  Pain Location: R foot  Pain Descriptors / Indicators:  Aching Pain Intervention(s): Limited activity within patient's tolerance;Monitored during session    [place] expects to be discharged to:: Private residence Living Arrangements: Spouse/significant other Available Help at Discharge: Family Type of Home: House Home Access: Stairs to enter Entrance Stairs-Rails: Right;Left;Can reach both Entrance Stairs-Number of Steps: 7 Home Layout: One level Home Equipment: Environmental consultant - 2 wheels;Wheelchair - manual;Cane - single point;Shower seat      Prior Function Level of Independence: Needs assistance   Gait / Transfers Assistance Needed: able to use her walker in the home   ADL's / Homemaking Assistance Needed: Reports that her husband assists her with bathing, dressing, and toiletting.         Hand Dominance        Extremity/Trunk Assessment   Upper Extremity Assessment Upper Extremity Assessment: Defer to OT evaluation    Lower Extremity Assessment Lower Extremity Assessment: Generalized weakness    Cervical / Trunk Assessment Cervical / Trunk Assessment: Kyphotic  Communication   Communication: No difficulties  Cognition Arousal/Alertness: Awake/alert Behavior During Therapy: WFL for tasks assessed/performed Overall Cognitive Status: History of cognitive impairments - at baseline                                 General Comments: patient mildly confused but very pleasant       General Comments General comments (skin integrity, edema, etc.): multiple wounds noted B LEs, including small sores, large sore on R foot with 100% slough, blood blister on R foot; noted weeping of wounds     Exercises     Assessment/Plan    PT Assessment Patient needs continued PT services  PT Problem List Decreased strength;Decreased mobility;Decreased coordination;Obesity;Decreased activity tolerance;Decreased balance;Decreased knowledge of use of DME;Decreased safety awareness       PT Treatment Interventions  DME instruction;Therapeutic activities;Gait training;Therapeutic exercise;Patient/family education;Stair training;Balance training;Functional mobility training;Neuromuscular re-education    PT Goals (Current goals can be found in the Care Plan section)  Acute Rehab PT Goals PT Goal Formulation: Patient unable to participate in goal setting    Frequency Min 3X/week   Barriers to discharge        Co-evaluation               AM-PAC PT "6 Clicks" Daily Activity  Outcome Measure Difficulty turning over in bed (including adjusting bedclothes, sheets and blankets)?: Unable Difficulty moving from lying on back to sitting on the side of the bed? : Unable Difficulty sitting down on and standing up from a chair with arms (e.g., wheelchair, bedside commode, etc,.)?: Unable Help needed moving to and from a bed to chair (including a wheelchair)?: A Lot Help needed walking in hospital room?: A Lot Help needed climbing  3-5 steps with a railing? : A Lot 6 Click Score: 9    End of Session   Activity Tolerance: Patient tolerated treatment well;Patient limited by fatigue Patient left: in bed;with call bell/phone within reach;with bed alarm set Nurse Communication: Other (comment)(o2 desaturation during session ) PT Visit Diagnosis: Unsteadiness on feet (R26.81);History of falling (Z91.81);Muscle weakness (generalized) (M62.81);Other abnormalities of gait and mobility (R26.89);Difficulty in walking, not elsewhere classified (R26.2)    Time: 6773-7366 PT Time Calculation (min) (ACUTE ONLY): 25 min   Charges:   PT Evaluation $PT Eval Low Complexity: 1 Low PT Treatments $Therapeutic Activity: 8-22 mins   PT G Codes:   PT G-Codes **NOT FOR INPATIENT CLASS** Functional Assessment Tool Used: AM-PAC 6 Clicks Basic Mobility;Clinical judgement Functional Limitation: Mobility: Walking and moving around Mobility: Walking and Moving Around Current Status (K1594): At least 60 percent but less than 80  percent impaired, limited or restricted Mobility: Walking and Moving Around Goal Status [phone]): At least 40 percent but less than 60 percent impaired, limited or restricted     Deniece Ree PT, DPT, CBIS  Supplemental Physical Therapist [facility]

## 2017-10-09 NOTE — Consult Note (Signed)
WOC Nurse wound consult note Reason for Consult: Nonhealing wound to right dorsal foot.  Cellulitis to right lower leg and foot.  Wound type:Infectious Pressure Injury POA: NA Measurement: Right foot: 2 cm x 2 cm wound bed is 50% adherent slough Left third toe is erythematous and edematous.  Extends to left lateral foot and up lower leg.  Intact and dry.  Keep open to air.  Right great toe on metatarsal head has 1 cm intact blood filled blister.  Protect and leave open to air.  Wound bed: slough Drainage (amount, consistency, odor)minimal serosanguinous  No odor  Periwound:erythema and edema Dressing procedure/placement/frequency: Cleanse right foot with NS and pat dry.  Apply Santyl to wound bed right dorsal foot. Cover with NS moist gauze.  Secure with kerlix and tape.  Change daily.  Protect hematoma to right great toe. Open to air unless ruptures. Then silicone border foam dressing.   Will not follow at this time.  Please re-consult if needed.  Maple HudsonKaren Tarae Wooden RN BSN CWON Pager [phone]

## 2017-10-09 NOTE — NC FL2 (Signed)
MEDICAID FL2 LEVEL OF CARE SCREENING TOOL     IDENTIFICATION  Patient Name: Nancy Baldwin Birthdate: Nov 10, 1946 Sex: female Admission Date (Current Location): 10/08/2017  [facility] [place]   Facility and Address:  [facility],  [address], [place] [id number]      Provider Number: [id number]  Attending Physician Name and Address:  Philip AspenHernandez Acosta, Minerva EndsEstela*  Relative Name and Phone Number:  Fredderick PhenixJames Blackburn [phone]    Current Level of Care: Hospital Recommended Level of Care: Skilled Nursing Facility Prior Approval Number:    Date Approved/Denied: 02/03/14 PASRR Number: [id number] [id number]  Discharge Plan: SNF    Current Diagnoses: Patient Active Problem List   Diagnosis Date Noted  . Sepsis (HCC) 10/08/2017  . Elevated lactic acid level 10/08/2017  . Cellulitis of right foot 10/08/2017  . Falls frequently 10/08/2017  . Altered mental status, unspecified 10/08/2017  . Dehydration 10/08/2017  . AKI (acute kidney injury) (HCC) 10/08/2017  . Hypertension 10/08/2017  . Type 2 diabetes mellitus with other specified complication (HCC) 06/18/2017  . Thrombocytopenia (HCC) 06/18/2017  . Multiple closed fractures of ribs of left side   . T12 compression fracture (HCC) 06/13/2017  . 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 Intractable pain 06/12/2017  . Diastolic CHF (HCC) 02/07/2014  . Cirrhosis (HCC) 02/07/2014  . Hypothyroidism 02/07/2014  . Anxiety state, unspecified 02/07/2014  . Depression 02/07/2014    Orientation RESPIRATION BLADDER Height & Weight     Self, Place  Normal Continent Weight: 255 lb 8 oz (115.9 kg) Height:  5\' 7"  (170.2 cm)  BEHAVIORAL SYMPTOMS/MOOD NEUROLOGICAL BOWEL NUTRITION STATUS      Continent Diet(heart healthy)  AMBULATORY STATUS COMMUNICATION OF NEEDS Skin   Limited Assist Verbally PU Stage and Appropriate Care(Diabetic ulcers R foot)   PU Stage 2  Dressing: No Dressing                   Personal Care Assistance Level of Assistance  Bathing, Feeding, Dressing Bathing Assistance: Limited assistance Feeding assistance: Independent Dressing Assistance: Limited assistance     Functional Limitations Info  Sight, Hearing, Speech Sight Info: Adequate Hearing Info: Adequate Speech Info: Adequate    SPECIAL CARE FACTORS FREQUENCY  PT (By licensed PT)     PT Frequency: 5 times week              Contractures Contractures Info: Not present    Additional Factors Info  Code Status, Allergies Code Status Info: full Allergies Info: amoxicillin, codeine, pseudoephedrine, oxymetazoline, metformin and related, tricor           Current Medications (10/09/2017):  This is the current hospital active medication list Current Facility-Administered Medications  Medication Dose Route Frequency Provider Last Rate Last Dose  . 0.9 %  sodium chloride infusion  250 mL Intravenous PRN Johnson, Clanford L, MD      . aspirin EC tablet 81 mg  81 mg Oral Daily Johnson, Clanford L, MD   81 mg at 10/09/17 0941  . insulin aspart (novoLOG) injection 0-9 Units  0-9 Units Subcutaneous TID WC Johnson, Clanford L, MD   2 Units at 10/09/17 1156  . insulin glargine (LANTUS) injection 8 Units  8 Units Subcutaneous QAC breakfast Standley DakinsJohnson, Clanford L, MD   8 Units at 10/09/17 0931  . lactulose (CHRONULAC) 10 GM/15ML solution 30 g  30 g Oral BID Johnson, Clanford L, MD   30 g  at 10/09/17 0941  . levothyroxine (SYNTHROID, LEVOTHROID) tablet 125 mcg  125 mcg Oral QAC breakfast Laural BenesJohnson, Clanford L, MD   125 mcg at 10/09/17 0757  . ondansetron (ZOFRAN) tablet 4 mg  4 mg Oral Q6H PRN Johnson, Clanford L, MD       Or  . ondansetron (ZOFRAN) injection 4 mg  4 mg Intravenous Q6H PRN Johnson, Clanford L, MD      . oxyCODONE (Oxy IR/ROXICODONE) immediate release tablet 2.5 mg  2.5 mg Oral Q6H PRN Johnson, Clanford L, MD      . pantoprazole (PROTONIX) EC tablet 40 mg   40 mg Oral Daily Johnson, Clanford L, MD   40 mg at 10/09/17 0940  . pravastatin (PRAVACHOL) tablet 10 mg  10 mg Oral Daily Johnson, Clanford L, MD   10 mg at 10/09/17 0935  . propranolol (INDERAL) tablet 20 mg  20 mg Oral Daily Johnson, Clanford L, MD   20 mg at 10/09/17 0935  . rifaximin (XIFAXAN) tablet 550 mg  550 mg Oral BID Laural BenesJohnson, Clanford L, MD   550 mg at 10/09/17 0940  . sodium chloride flush (NS) 0.9 % injection 3 mL  3 mL Intravenous Q12H Johnson, Clanford L, MD   3 mL at 10/09/17 1000  . sodium chloride flush (NS) 0.9 % injection 3 mL  3 mL Intravenous PRN Johnson, Clanford L, MD      . spironolactone (ALDACTONE) tablet 12.5 mg  12.5 mg Oral BID Johnson, Clanford L, MD   12.5 mg at 10/09/17 0757  . vancomycin (VANCOCIN) 1,250 mg in sodium chloride 0.9 % 250 mL IVPB  1,250 mg Intravenous Q24H Ethelda ChickJacubowitz, Sam, MD 166.7 mL/hr at 10/09/17 1137 1,250 mg at 10/09/17 1137  . venlafaxine XR (EFFEXOR-XR) 24 hr capsule 150 mg  150 mg Oral Q breakfast Laural BenesJohnson, Clanford L, MD   150 mg at 10/09/17 0756     Discharge Medications: Please see discharge summary for a list of discharge medications.  Relevant Imaging Results:  Relevant Lab Results:   Additional Information    Elliot GaultKathleen Torry Istre, LCSW

## 2017-10-09 NOTE — Progress Notes (Signed)
PROGRESS NOTE    Nancy Baldwin  ZOX:[id number] DOB: [birth year] DOA: 10/08/2017 PCP: Ignatius Specking, MD     Brief Narrative:  71 year old woman admitted from home on 11/28 due to confusion.  She has a history of chronic liver disease and cirrhosis, chronic diastolic CHF, anxiety disorder, hypertension and history of lung cancer.  Who presented to the emergency department after a fall at home.  She never lost consciousness, EMS was called and brought her to the hospital for evaluation.  She was not found to have any acute fractures or injuries.  She was however noted to have an elevated lactic acid as well as possibly cellulitis of the right lower extremity.  Admission was requested.   Assessment & Plan:   Principal Problem:   Sepsis (HCC) Active Problems:   Diastolic CHF (HCC)   Cirrhosis (HCC)   Hypothyroidism   Type 2 diabetes mellitus with other specified complication (HCC)   Thrombocytopenia (HCC)   Elevated lactic acid level   Cellulitis of right foot   Falls frequently   Altered mental status, unspecified   Dehydration   AKI (acute kidney injury) (HCC)   Hypertension   Early sepsis -Source is potentially right lower extremity cellulitis. -Sepsis parameters have resolved. -Culture data is pending. -Continue IV vancomycin for now. -Wound care consult has been requested for evaluation of right foot dorsal lesion.  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 -Creatinine is currently creatinine is currently 1.7 up from 1.57 yesterday. -We will start saline 75 cc an hours gently over the next 12 hours and recheck renal function in a.m. -Not on nephrotoxic agents.  Acute metabolic encephalopathy -Resolved.  At baseline. -Etiology unclear.  Chronic diastolic CHF -Appears compensated, gentle IV fluids due to acute renal failure and sepsis.  Nash  cirrhosis -Compensated.  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  Avoid heparin.  Benign essential hypertension -Well-controlled.  Hypothyroidism -Continue Synthroid.  Frequent falls -PT is recommending SNF, social work is aware.     DVT prophylaxis: SCDs Code Status: Full code Family Communication: Patient only Disposition Plan: Plan SNF once medically ready  Consultants:   None  Procedures:   None  Antimicrobials:  Anti-infectives (From admission, onward)   Start     Dose/Rate Route Frequency Ordered Stop   10/09/17 1000  vancomycin (VANCOCIN) 1,250 mg in sodium chloride 0.9 % 250 mL IVPB     1,250 mg 166.7 mL/hr over 90 Minutes Intravenous Every 24 hours 10/08/17 1022     10/08/17 2200  rifaximin (XIFAXAN) tablet 550 mg     550 mg Oral 2 times daily 10/08/17 1807     10/08/17 0945  vancomycin (VANCOCIN) IVPB 1000 mg/200 mL premix     1,000 mg 200 mL/hr over 60 Minutes Intravenous Every 1 hr x 2 10/08/17 0944 10/08/17 1239       Subjective: States her right leg especially foot is painful, otherwise no complaints  Objective: Vitals:   10/08/17 2101 10/09/17 0607 10/09/17 0935 10/09/17 1300  BP: (!) 105/38 118/72 112/64 115/66  Pulse: 83 80 89 71  Resp: 16 16  16   Temp: (!) 97.4 F (36.3 C) (!) 97.4 F (36.3 C)  (!) 97.5 F (36.4 C)  TempSrc: Oral Oral  Oral  SpO2: 97% 93%  99%  Weight:      Height:        Intake/Output Summary (Last 24 hours)  at 10/09/2017 1739 Last data filed at 10/09/2017 1738 Gross per 24 hour  Intake 979.83 ml  Output 300 ml  Net 679.83 ml   Filed Weights   10/08/17 0801 10/08/17 1812  Weight: 108.9 kg (240 lb) 115.9 kg (255 lb 8 oz)    Examination:  General exam: Alert, awake, oriented x 3, morbidly obese Respiratory system: Clear to auscultation. Respiratory effort normal. Cardiovascular system:RRR. No murmurs, rubs, gallops. Gastrointestinal system: Abdomen is nondistended, soft and nontender. No  organomegaly or masses felt. Normal bowel sounds heard. Central nervous system: Alert and oriented. No focal neurological deficits. Extremities: Right foot with redness from dorsum of the foot up to mid calf, has a wound with sloughed skin on the dorsum of her right foot as well as a blood blister over her first right metatarsal Skin: No rashes, lesions or ulcers Psychiatry: Judgement and insight appear normal. Mood & affect appropriate.     Data Reviewed: I have personally reviewed following labs and imaging studies  CBC: Recent Labs  Lab 10/08/17 0818 10/09/17 0523  WBC 7.8 8.0  NEUTROABS 5.1 4.7  HGB 11.7* 11.1*  HCT 37.2 35.2*  MCV 106.6* 108.0*  PLT 119* 122*   Basic Metabolic Panel: Recent Labs  Lab 10/08/17 0818 10/09/17 0523  NA 136 135  K 4.3 4.5  CL 102 103  CO2 25 25  GLUCOSE 126* 172*  BUN 43* 45*  CREATININE 1.57* 1.74*  CALCIUM 9.3 8.8*  MG  --  2.3   GFR: Estimated Creatinine Clearance: 39.6 mL/min (A) (by C-G formula based on SCr of 1.74 mg/dL (H)). Liver Function Tests: Recent Labs  Lab 10/08/17 0818 10/09/17 0523  AST 159* 168*  ALT 108* 106*  ALKPHOS 414* 386*  BILITOT 4.0* 3.9*  PROT 5.9* 5.7*  ALBUMIN 2.2* 2.1*   No results for input(s): LIPASE, AMYLASE in the last 168 hours. Recent Labs  Lab 10/08/17 0818  AMMONIA 33   Coagulation Profile: Recent Labs  Lab 10/08/17 0840  INR 1.48   Cardiac Enzymes: Recent Labs  Lab 10/08/17 0840  CKTOTAL 103   BNP (last 3 results) No results for input(s): PROBNP in the last 8760 hours. HbA1C: No results for input(s): HGBA1C in the last 72 hours. CBG: Recent Labs  Lab 10/08/17 1655 10/08/17 2056 10/09/17 0729 10/09/17 1131 10/09/17 1629  GLUCAP 175* 151* 154* 192* 148*   Lipid Profile: No results for input(s): CHOL, HDL, LDLCALC, TRIG, CHOLHDL, LDLDIRECT in the last 72 hours. Thyroid Function Tests: No results for input(s): TSH, T4TOTAL, FREET4, T3FREE, THYROIDAB in the last 72  hours. Anemia Panel: No results for input(s): VITAMINB12, FOLATE, FERRITIN, TIBC, IRON, RETICCTPCT in the last 72 hours. Urine analysis:    Component Value Date/Time   COLORURINE AMBER (A) 10/08/2017 0818   APPEARANCEUR CLOUDY (A) 10/08/2017 0818   LABSPEC 1.019 10/08/2017 0818   PHURINE 5.0 10/08/2017 0818   GLUCOSEU NEGATIVE 10/08/2017 0818   HGBUR MODERATE (A) 10/08/2017 0818   BILIRUBINUR NEGATIVE 10/08/2017 0818   KETONESUR NEGATIVE 10/08/2017 0818   PROTEINUR NEGATIVE 10/08/2017 0818   NITRITE NEGATIVE 10/08/2017 0818   LEUKOCYTESUR NEGATIVE 10/08/2017 0818   Sepsis Labs: @LABRCNTIP (procalcitonin:4,lacticidven:4)  ) Recent Results (from the past 240 hour(s))  Blood culture (routine x 2)     Status: None (Preliminary result)   Collection Time: 10/08/17  8:42 AM  Result Value Ref Range Status   Specimen Description BLOOD LEFT ARM  Final   Special Requests   Final  BOTTLES DRAWN AEROBIC AND ANAEROBIC Blood Culture adequate volume   Culture NO GROWTH 1 DAY  Final   Report Status PENDING  Incomplete  Blood culture (routine x 2)     Status: None (Preliminary result)   Collection Time: 10/08/17 10:05 AM  Result Value Ref Range Status   Specimen Description BLOOD RIGHT ARM  Final   Special Requests   Final    BOTTLES DRAWN AEROBIC AND ANAEROBIC Blood Culture adequate volume   Culture NO GROWTH < 24 HOURS  Final   Report Status PENDING  Incomplete         Radiology Studies: Dg Ribs Unilateral W/chest Left  Result Date: 10/08/2017 CLINICAL DATA:  Fall, left rib pain. EXAM: LEFT RIBS AND CHEST - 3+ VIEW COMPARISON:  Chest x-ray 08/16/2017 FINDINGS: Multiple left rib fractures are noted, involving at least ribs 4 through 8 laterally. At least some of these were noted on prior chest x-ray and prior CT from 06/12/2017. Lungs are clear. No effusions or pneumothorax. Heart is borderline in size. IMPRESSION: Multiple left lateral rib fractures, at least ribs 4 through 8. At  least some with these were present on prior chest CT and chest x-rays dating back to 06/12/2017. No pneumothorax. Electronically Signed   By: Charlett Nose M.D.   On: 10/08/2017 09:28   Dg Thoracic Spine 2 View  Result Date: 10/08/2017 CLINICAL DATA:  Back pain after fall today. EXAM: THORACIC SPINE 2 VIEWS COMPARISON:  CT scan of June 12, 2017. FINDINGS: Status post kyphoplasty of T7 and T8. No acute fracture or spondylolisthesis is noted. Disc spaces are well-maintained. Mild S-shaped scoliosis of lower thoracic spine is noted. IMPRESSION: Status post kyphoplasty of T7 and T8 vertebral bodies. No acute abnormality seen in the thoracic spine. Electronically Signed   By: Lupita Raider, M.D.   On: 10/08/2017 09:39   Dg Lumbar Spine Complete  Result Date: 10/08/2017 CLINICAL DATA:  Low back pain after fall today. EXAM: LUMBAR SPINE - COMPLETE 4+ VIEW COMPARISON:  CT scan of June 12, 2017. FINDINGS: Moderate dextroscoliosis of lumbar spine is noted. Stable old compression fractures of T12 and L1 vertebral bodies is noted. Severe degenerative disc disease is noted at L3-4. Osteopenia is noted. No acute fracture or spondylolisthesis is noted. IMPRESSION: Old T12 and L1 fractures. No acute abnormality seen in the lumbar spine. Electronically Signed   By: Lupita Raider, M.D.   On: 10/08/2017 09:41   Ct Head Wo Contrast  Result Date: 10/08/2017 CLINICAL DATA:  Status post fall.  Poor historian. EXAM: CT HEAD WITHOUT CONTRAST CT CERVICAL SPINE WITHOUT CONTRAST TECHNIQUE: Multidetector CT imaging of the head and cervical spine was performed following the standard protocol without intravenous contrast. Multiplanar CT image reconstructions of the cervical spine were also generated. COMPARISON:  None. FINDINGS: CT HEAD FINDINGS Brain: No evidence of acute infarction, hemorrhage, extra-axial collection, ventriculomegaly, or mass effect. Generalized cerebral atrophy. Periventricular white matter low attenuation  likely secondary to microangiopathy. Vascular: Cerebrovascular atherosclerotic calcifications are noted. Skull: Negative for fracture or focal lesion. Sinuses/Orbits: Visualized portions of the orbits are unremarkable. Visualized portions of the paranasal sinuses and mastoid air cells are unremarkable. Other: None. CT CERVICAL SPINE FINDINGS Alignment: Normal. Skull base and vertebrae: No acute fracture. No primary bone lesion or focal pathologic process. Soft tissues and spinal canal: No prevertebral fluid or swelling. No visible canal hematoma. Disc levels: Degenerative disc disease with disc height loss at C5-6, C6-7 C7-T1. Broad-based disc osteophyte complex at  C5-6 and C6-7 impressing on the thecal sac. Bilateral uncovertebral degenerative changes at C6-7 with foraminal encroachment. Upper chest: Lung apices are clear. Other: No fluid collection or hematoma. IMPRESSION: 1. No acute intracranial pathology. 2. No acute osseous injury of the cervical spine. Electronically Signed   By: Elige KoHetal  Patel   On: 10/08/2017 09:49   Ct Cervical Spine Wo Contrast  Result Date: 10/08/2017 CLINICAL DATA:  Status post fall.  Poor historian. EXAM: CT HEAD WITHOUT CONTRAST CT CERVICAL SPINE WITHOUT CONTRAST TECHNIQUE: Multidetector CT imaging of the head and cervical spine was performed following the standard protocol without intravenous contrast. Multiplanar CT image reconstructions of the cervical spine were also generated. COMPARISON:  None. FINDINGS: CT HEAD FINDINGS Brain: No evidence of acute infarction, hemorrhage, extra-axial collection, ventriculomegaly, or mass effect. Generalized cerebral atrophy. Periventricular white matter low attenuation likely secondary to microangiopathy. Vascular: Cerebrovascular atherosclerotic calcifications are noted. Skull: Negative for fracture or focal lesion. Sinuses/Orbits: Visualized portions of the orbits are unremarkable. Visualized portions of the paranasal sinuses and mastoid  air cells are unremarkable. Other: None. CT CERVICAL SPINE FINDINGS Alignment: Normal. Skull base and vertebrae: No acute fracture. No primary bone lesion or focal pathologic process. Soft tissues and spinal canal: No prevertebral fluid or swelling. No visible canal hematoma. Disc levels: Degenerative disc disease with disc height loss at C5-6, C6-7 C7-T1. Broad-based disc osteophyte complex at C5-6 and C6-7 impressing on the thecal sac. Bilateral uncovertebral degenerative changes at C6-7 with foraminal encroachment. Upper chest: Lung apices are clear. Other: No fluid collection or hematoma. IMPRESSION: 1. No acute intracranial pathology. 2. No acute osseous injury of the cervical spine. Electronically Signed   By: Elige KoHetal  Patel   On: 10/08/2017 09:49   Dg Foot Complete Right  Result Date: 10/08/2017 CLINICAL DATA:  Right foot injury due to a fall getting out of bed this morning. Initial encounter. EXAM: RIGHT FOOT COMPLETE - 3+ VIEW COMPARISON:  None. FINDINGS: No acute bony or joint abnormality is identified. Well corticated bone fragment off the dorsal margin of the neck of the talus is most consistent with remote injury. Soft tissues about the foot appear somewhat swollen. IMPRESSION: Soft tissue swelling without acute bony abnormality. Small well corticated bone fragment off the dorsal neck of the talus is likely due to remote injury. Electronically Signed   By: Drusilla Kannerhomas  Dalessio M.D.   On: 10/08/2017 09:34   Dg Hips Bilat W Or Wo Pelvis 3-4 Views  Result Date: 10/08/2017 CLINICAL DATA:  Hip pain after fall this morning. EXAM: DG HIP (WITH OR WITHOUT PELVIS) 3-4V BILAT COMPARISON:  None. FINDINGS: There is no evidence of hip fracture or dislocation. There is no evidence of arthropathy or other focal bone abnormality. IMPRESSION: Normal bilateral hips. Electronically Signed   By: Lupita RaiderJames  Green Jr, M.D.   On: 10/08/2017 09:32        Scheduled Meds: . aspirin EC  81 mg Oral Daily  . collagenase    Topical Daily  . insulin aspart  0-9 Units Subcutaneous TID WC  . insulin glargine  8 Units Subcutaneous QAC breakfast  . lactulose  30 g Oral BID  . levothyroxine  125 mcg Oral QAC breakfast  . pantoprazole  40 mg Oral Daily  . pravastatin  10 mg Oral Daily  . propranolol  20 mg Oral Daily  . rifaximin  550 mg Oral BID  . sodium chloride flush  3 mL Intravenous Q12H  . spironolactone  12.5 mg Oral BID  .  venlafaxine XR  150 mg Oral Q breakfast   Continuous Infusions: . sodium chloride 250 mL (10/09/17 1601)  . vancomycin 1,250 mg (10/09/17 1137)     LOS: 1 day    Time spent: 35 minutes. Greater than 50% of this time was spent in direct contact with the patient coordinating care.     Chaya JanEstela Hernandez Acosta, MD [facility] Pager [phone]  If 7PM-7AM, please contact night-coverage [email or web address] Password [email or web address] 10/09/2017, 5:39 PM

## 2017-10-09 NOTE — Clinical Social Work Note (Signed)
Clinical Social Work Assessment  Patient Details  Name: Nancy Baldwin MRN: [id number] Date of Birth: [birth year]  Date of referral:  10/09/17               Reason for consult:  Facility Placement                Permission sought to share information with:  Chartered certified accountant granted to share information::  Yes, Verbal Permission Granted  Name::        Agency::  [place] [facility]  Relationship::     Contact Information:     Housing/Transportation Living arrangements for the past 2 months:  [place], [place] of Information:  Patient, Spouse Patient Interpreter Needed:  None Criminal Activity/Legal Involvement Pertinent to Current Situation/Hospitalization:  No - Comment as needed Significant Relationships:  Spouse Lives with:  Spouse Do you feel safe going back to the place where you live?  Yes Need for family participation in patient care:  Yes (Comment)  Care giving concerns: Pt's husband states that pt recently used all SNF days and he can't pay privately for snf.   Social Worker assessment / plan: Pt is a 71 year old female admitted with sepsis and cellulitis of R foot. Received SW consult for snf placement. Met with pt this AM to assess. Pt not fully alert and oriented at the time of SW visit. Contacted pt's husband by phone to assess. Pt's husband states that pt was at [facility] for 3 months and discharged home about a month ago when insurance benefits ran out.Pthas had therapy from [place] since her return home.  Pt's husband states that he thinks pt's foot problems started while she was at [facility] and he is not happy about this. He states that unless they have more insurance benefits, he will have to take her home as he cannot pay privately for snf. Pt's husband states that if pt does have snf benefits, the only facility he will consider is [facility]. He states pt has been fairly independent in ADLs since coming home but she has  had falls. Pt has a walker, commode, and wheelchair for DME. SW will send authorization request to pt's insurance and start referral to [facility]. Updated PT that pt may have to go home with [facility] therapy. Also updated RN CM. SW will follow.  Employment status:  Retired Nurse, adult PT Recommendations:  [place] / Referral to community resources:     Patient/Family's Response to care: Pt and family accepting of SW care.  Patient/Family's Understanding of and Emotional Response to Diagnosis, Current Treatment, and Prognosis: Pt appears somewhat confused today. Her husband appears to understand diagnosis and treatment plan.  Emotional Assessment Appearance:  Appears stated age Attitude/Demeanor/Rapport:    Affect (typically observed):  Appropriate, Calm, Pleasant Orientation:  Oriented to Self, Oriented to Place Alcohol / Substance use:  Not Applicable Psych involvement (Current and /or in the community):  No (Comment)  Discharge Needs  Concerns to be addressed:  Discharge Planning Concerns Readmission within the last 30 days:  No Current discharge risk:  Physical Impairment Barriers to Discharge:  [place], LCSW 10/09/2017, 12:12 PM

## 2017-10-10 LAB — LACTIC ACID, PLASMA: LACTIC ACID, VENOUS: 2 mmol/L — AB (ref 0.5–1.9)

## 2017-10-10 LAB — CBC WITH DIFFERENTIAL/PLATELET
BASOS ABS: 0.1 10*3/uL (ref 0.0–0.1)
BASOS PCT: 1 %
EOS ABS: 0.4 10*3/uL (ref 0.0–0.7)
EOS PCT: 5 %
HCT: 38.1 % (ref 36.0–46.0)
Hemoglobin: 11.9 g/dL — ABNORMAL LOW (ref 12.0–15.0)
Lymphocytes Relative: 16 %
Lymphs Abs: 1.2 10*3/uL (ref 0.7–4.0)
MCH: 33.5 pg (ref 26.0–34.0)
MCHC: 31.2 g/dL (ref 30.0–36.0)
MCV: 107.3 fL — ABNORMAL HIGH (ref 78.0–100.0)
Monocytes Absolute: 1.6 10*3/uL — ABNORMAL HIGH (ref 0.1–1.0)
Monocytes Relative: 20 %
NEUTROS PCT: 59 %
Neutro Abs: 4.6 10*3/uL (ref 1.7–7.7)
PLATELETS: 112 10*3/uL — AB (ref 150–400)
RBC: 3.55 MIL/uL — AB (ref 3.87–5.11)
RDW: 19.6 % — ABNORMAL HIGH (ref 11.5–15.5)
WBC: 7.9 10*3/uL (ref 4.0–10.5)

## 2017-10-10 LAB — GLUCOSE, CAPILLARY
GLUCOSE-CAPILLARY: 117 mg/dL — AB (ref 65–99)
GLUCOSE-CAPILLARY: 200 mg/dL — AB (ref 65–99)
Glucose-Capillary: 229 mg/dL — ABNORMAL HIGH (ref 65–99)
Glucose-Capillary: 154 mg/dL — ABNORMAL HIGH (ref 65–99)

## 2017-10-10 LAB — COMPREHENSIVE METABOLIC PANEL
ALK PHOS: 392 U/L — AB (ref 38–126)
ALT: 102 U/L — AB (ref 14–54)
ANION GAP: 8 (ref 5–15)
AST: 166 U/L — ABNORMAL HIGH (ref 15–41)
Albumin: 2 g/dL — ABNORMAL LOW (ref 3.5–5.0)
BUN: 43 mg/dL — ABNORMAL HIGH (ref 6–20)
CALCIUM: 8.5 mg/dL — AB (ref 8.9–10.3)
CO2: 22 mmol/L (ref 22–32)
CREATININE: 1.8 mg/dL — AB (ref 0.44–1.00)
Chloride: 106 mmol/L (ref 101–111)
GFR, EST AFRICAN AMERICAN: 32 mL/min — AB (ref >60)
GFR, EST NON AFRICAN AMERICAN: 27 mL/min — AB (ref >60)
Glucose, Bld: 143 mg/dL — ABNORMAL HIGH (ref 65–99)
Potassium: 4.9 mmol/L (ref 3.5–5.1)
Sodium: 136 mmol/L (ref 135–145)
TOTAL PROTEIN: 5.7 g/dL — AB (ref 6.5–8.1)
Total Bilirubin: 3.5 mg/dL — ABNORMAL HIGH (ref 0.3–1.2)

## 2017-10-10 LAB — MAGNESIUM: MAGNESIUM: 2.4 mg/dL (ref 1.7–2.4)

## 2017-10-10 MED ORDER — DOXYCYCLINE HYCLATE 100 MG PO TABS
100.0000 mg | ORAL_TABLET | Freq: Two times a day (BID) | ORAL | Status: DC
  Administered 2017-10-10 – 2017-10-11 (×2): 100 mg via ORAL
  Filled 2017-10-10 (×2): qty 1

## 2017-10-10 NOTE — Progress Notes (Signed)
Patient has what appears to be a callus to the L great toe that cracked open when up with PT this morning.  Small amount of blood draining.  Site cleansed with NS and foam dressing applied for protection.  Dressing changed to R foot with santyl.  Patient remains edematous to lower legs, small serous blisters noted above ankles. Some have started draining serous fluid - foam dressings applied to areas.

## 2017-10-10 NOTE — Care Management Important Message (Signed)
Important Message  Patient Details  Name: Celesta GentileKacie S Coffie MRN: [id number] Date of Birth: [birth year]   Medicare Important Message Given:       Aune Adami, Chrystine OilerSharley Diane, RN 10/10/2017, 10:58 AM

## 2017-10-10 NOTE — Clinical Social Work Note (Signed)
LCS[facility] ([facility] will be going home at discharge).    Lorriane Dehart, Juleen ChinaHeather D, LCSW

## 2017-10-10 NOTE — Care Management Important Message (Signed)
Important Message  Patient Details  Name: Celesta GentileKacie S Berninger MRN: [id number] Date of Birth: 09/13/1946   Medicare Important Message Given:  Yes    Markelle Asaro, Chrystine OilerSharley Diane, RN 10/10/2017, 10:58 AM

## 2017-10-10 NOTE — Progress Notes (Signed)
PROGRESS NOTE    Nancy Baldwin  ZOX:[id number] DOB: June 07, 1946 DOA: 10/08/2017 PCP: Ignatius SpeckingVyas, Dhruv B, MD     Brief Narrative:  71 year old woman admitted from home on 11/28 due to confusion.  Past medical history significant for chronic liver disease and cirrhosis, chronic diastolic CHF, morbid obesity, anxiety disorder, hypertension and history of lung cancer.  She presented to the emergency department after a fall at home.  Never lost consciousness.  She was not found to have any acute fractures or injuries.  She was however noted to have an elevated lactic acid as well as possibly cellulitis of the right lower extremity and admission was requested.   Assessment & Plan:   Principal Problem:   Sepsis (HCC) Active Problems:   Diastolic CHF (HCC)   Cirrhosis (HCC)   Hypothyroidism   Type 2 diabetes mellitus with other specified complication (HCC)   Thrombocytopenia (HCC)   Elevated lactic acid level   Cellulitis of right foot   Falls frequently   Altered mental status, unspecified   Dehydration   AKI (acute kidney injury) (HCC)   Hypertension   Early sepsis -Source is likely right lower extremity cellulitis. -Sepsis parameters have resolved. -Culture data remains negative at this point. -We will transition off vancomycin and placed on doxycycline and evaluate in the hospital for an extra 24 hours.  Acute on chronic kidney disease stage II -Baseline creatinine appears to be around 0.85. -Creatinine continues to increase to 1.8. -Received saline for an extra 12 hours.  We will recheck renal function in a.m., if continues to elevate may request nephrology consultation. -Is not currently on any nephrotoxic agents.  Acute metabolic encephalopathy -Resolved, at baseline. -Etiology is unclear.  Chronic diastolic CHF -Appears compensated.  Nash cirrhosis -Compensated.  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  Hypothyroidism -Continue  Synthroid  Benign essential hypertension -Well-controlled.  Frequent falls -Patient will DC home with home health once medically ready, anticipate 24 hours   DVT prophylaxis: SCDs Code Status: Full code Family Communication: Patient only Disposition Plan: Anticipate discharge home in 24 hours  Consultants:   None  Procedures:   None  Antimicrobials:  Anti-infectives (From admission, onward)   Start     Dose/Rate Route Frequency Ordered Stop   10/09/17 1000  vancomycin (VANCOCIN) 1,250 mg in sodium chloride 0.9 % 250 mL IVPB     1,250 mg 166.7 mL/hr over 90 Minutes Intravenous Every 24 hours 10/08/17 1022     10/08/17 2200  rifaximin (XIFAXAN) tablet 550 mg     550 mg Oral 2 times daily 10/08/17 1807     10/08/17 0945  vancomycin (VANCOCIN) IVPB 1000 mg/200 mL premix     1,000 mg 200 mL/hr over 60 Minutes Intravenous Every 1 hr x 2 10/08/17 0944 10/08/17 1239       Subjective: Her only complaint remains pain of right lower extremity  Objective: Vitals:   10/09/17 2045 10/09/17 2140 10/10/17 0414 10/10/17 0509  BP:  (!) 118/53 (!) 116/40 (!) 118/42  Pulse:  69 71 84  Resp:  20 16 16   Temp:  97.8 F (36.6 C) 97.9 F (36.6 C) 97.7 F (36.5 C)  TempSrc:  Oral Oral Oral  SpO2: 95% 99% 94% (!) 87%  Weight:      Height:        Intake/Output Summary (Last 24 hours) at 10/10/2017 1644 Last data filed at 10/10/2017 1200 Gross per 24 hour  Intake 1859.83 ml  Output 700 ml  Net 1159.83 ml   Filed Weights   10/08/17 0801 10/08/17 1812  Weight: 108.9 kg (240 lb) 115.9 kg (255 lb 8 oz)    Examination:  General exam: Alert, awake, oriented x 3, morbidly obese Respiratory system: Clear to auscultation. Respiratory effort normal. Cardiovascular system:RRR. No murmurs, rubs, gallops. Gastrointestinal system: Abdomen is nondistended, soft and nontender. No organomegaly or masses felt. Normal bowel sounds heard. Central nervous system: Alert and oriented. No focal  neurological deficits. Extremities: Right foot with redness from dorsum of the foot up to above the ankle, has a wound with sloughed skin on the dorsum of her right foot as well as a blood blister over her first right metatarsal Skin: No rashes, lesions or ulcers Psychiatry: Judgement and insight appear normal. Mood & affect appropriate.     Data Reviewed: I have personally reviewed following labs and imaging studies  CBC: Recent Labs  Lab 10/08/17 0818 10/09/17 0523 10/10/17 0445  WBC 7.8 8.0 7.9  NEUTROABS 5.1 4.7 4.6  HGB 11.7* 11.1* 11.9*  HCT 37.2 35.2* 38.1  MCV 106.6* 108.0* 107.3*  PLT 119* 122* 112*   Basic Metabolic Panel: Recent Labs  Lab 10/08/17 0818 10/09/17 0523 10/10/17 0445  NA 136 135 136  K 4.3 4.5 4.9  CL 102 103 106  CO2 25 25 22   GLUCOSE 126* 172* 143*  BUN 43* 45* 43*  CREATININE 1.57* 1.74* 1.80*  CALCIUM 9.3 8.8* 8.5*  MG  --  2.3 2.4   GFR: Estimated Creatinine Clearance: 38.2 mL/min (A) (by C-G formula based on SCr of 1.8 mg/dL (H)). Liver Function Tests: Recent Labs  Lab 10/08/17 0818 10/09/17 0523 10/10/17 0445  AST 159* 168* 166*  ALT 108* 106* 102*  ALKPHOS 414* 386* 392*  BILITOT 4.0* 3.9* 3.5*  PROT 5.9* 5.7* 5.7*  ALBUMIN 2.2* 2.1* 2.0*   No results for input(s): LIPASE, AMYLASE in the last 168 hours. Recent Labs  Lab 10/08/17 0818  AMMONIA 33   Coagulation Profile: Recent Labs  Lab 10/08/17 0840  INR 1.48   Cardiac Enzymes: Recent Labs  Lab 10/08/17 0840  CKTOTAL 103   BNP (last 3 results) No results for input(s): PROBNP in the last 8760 hours. HbA1C: No results for input(s): HGBA1C in the last 72 hours. CBG: Recent Labs  Lab 10/09/17 1629 10/09/17 2112 10/10/17 0807 10/10/17 1125 10/10/17 1641  GLUCAP 148* 183* 117* 229* 154*   Lipid Profile: No results for input(s): CHOL, HDL, LDLCALC, TRIG, CHOLHDL, LDLDIRECT in the last 72 hours. Thyroid Function Tests: No results for input(s): TSH, T4TOTAL,  FREET4, T3FREE, THYROIDAB in the last 72 hours. Anemia Panel: No results for input(s): VITAMINB12, FOLATE, FERRITIN, TIBC, IRON, RETICCTPCT in the last 72 hours. Urine analysis:    Component Value Date/Time   COLORURINE AMBER (A) 10/08/2017 0818   APPEARANCEUR CLOUDY (A) 10/08/2017 0818   LABSPEC 1.019 10/08/2017 0818   PHURINE 5.0 10/08/2017 0818   GLUCOSEU NEGATIVE 10/08/2017 0818   HGBUR MODERATE (A) 10/08/2017 0818   BILIRUBINUR NEGATIVE 10/08/2017 0818   KETONESUR NEGATIVE 10/08/2017 0818   PROTEINUR NEGATIVE 10/08/2017 0818   NITRITE NEGATIVE 10/08/2017 0818   LEUKOCYTESUR NEGATIVE 10/08/2017 0818   Sepsis Labs: @LABRCNTIP (procalcitonin:4,lacticidven:4)  ) Recent Results (from the past 240 hour(s))  Blood culture (routine x 2)     Status: None (Preliminary result)   Collection Time: 10/08/17  8:42 AM  Result Value Ref Range Status   Specimen Description BLOOD LEFT ARM  Final   Special Requests  Final    BOTTLES DRAWN AEROBIC AND ANAEROBIC Blood Culture adequate volume   Culture NO GROWTH 2 DAYS  Final   Report Status PENDING  Incomplete  Blood culture (routine x 2)     Status: None (Preliminary result)   Collection Time: 10/08/17 10:05 AM  Result Value Ref Range Status   Specimen Description BLOOD RIGHT ARM  Final   Special Requests   Final    BOTTLES DRAWN AEROBIC AND ANAEROBIC Blood Culture adequate volume   Culture NO GROWTH 2 DAYS  Final   Report Status PENDING  Incomplete         Radiology Studies: No results found.      Scheduled Meds: . aspirin EC  81 mg Oral Daily  . collagenase   Topical Daily  . insulin aspart  0-9 Units Subcutaneous TID WC  . insulin glargine  8 Units Subcutaneous QAC breakfast  . lactulose  30 g Oral BID  . levothyroxine  125 mcg Oral QAC breakfast  . pantoprazole  40 mg Oral Daily  . pravastatin  10 mg Oral Daily  . propranolol  20 mg Oral Daily  . rifaximin  550 mg Oral BID  . spironolactone  12.5 mg Oral BID  .  venlafaxine XR  150 mg Oral Q breakfast   Continuous Infusions: . vancomycin Stopped (10/10/17 1230)     LOS: 2 days    Time spent: 25 minutes. Greater than 50% of this time was spent in direct contact with the patient coordinating care.     Chaya JanEstela Hernandez Acosta, MD [facility] Pager [phone]  If 7PM-7AM, please contact night-coverage [email or web address] Password [email or web address] 10/10/2017, 4:44 PM

## 2017-10-10 NOTE — Progress Notes (Signed)
Physical Therapy Treatment Patient Details Name: Nancy Baldwin MRN: [id number] DOB: 03/01/1946 Today's Date: 10/10/2017    History of Present Illness Nancy Baldwin is a 71 y.o. female with chronic liver disease and cirrhosis, chronic diastolic congestive heart failure, anxiety disorder, hypertension, and history of lung cancer who reportedly has frequent falls at home and presented to the emergency department early this morning after a fall at home.  She apparently had been trying to get out of bed when she slid onto the floor and landed on her buttocks.  She reports that her husband was unable to get her up.  EMS brought her to the ED for treatment.  No acute fractures or injuries noted.  She was noted to have an elevated lactic acid level.  She was also noted to have cellulitis of the right lower extremity.  She does have chronic wounds on both lower extremities.  She was also noted to be slightly confused.  Her husband reported that this is normally her baseline but she does have waxing and waning periods of confusion.  She did have a normal ammonia level.  It is unclear if she is taking her lactulose regularly.  It is unclear if she is taking her other medications regularly.  The patient was noted to have normal blood pressure and vitals.  The patient is being admitted for cellulitis of the right lower extremity and also sepsis associated with right lower extremity infection.  The patient was also noted to be dehydrated and has an acute renal failure.    PT Comments    Patient demonstrates increased BLE strength for sit to stands and gait training, had difficulty sitting up at bedside due to BLE weakness, able to ambulate in hallway without loss of balance, but had bleeding from left plantar surface blister on great toe with no c/o pain.  Patient will benefit from continued physical therapy in hospital and recommended venue below to increase strength, balance, endurance for safe ADLs and gait.    Follow Up Recommendations  Home health PT;Supervision for mobility/OOB     Equipment Recommendations  None recommended by PT    Recommendations for Other Services       Precautions / Restrictions Precautions Precautions: Fall Precaution Comments: woud dorsal surface right foot, wound left bottom great toe Restrictions Weight Bearing Restrictions: No    Mobility  Bed Mobility Overal bed mobility: Needs Assistance Bed Mobility: Supine to Sit;Sit to Supine     Supine to sit: Mod assist Sit to supine: Min assist      Transfers Overall transfer level: Needs assistance Equipment used: Rolling walker (2 wheeled) Transfers: Sit to/from [employer] CorporationStand;Stand Pivot Transfers Sit to Stand: Min guard Stand pivot transfers: Min guard          Ambulation/Gait Ambulation/Gait assistance: Min guard Ambulation Distance (Feet): 60 Feet Assistive device: Rolling walker (2 wheeled) Gait Pattern/deviations: Decreased step length - right;Decreased step length - left;Decreased stride length   Gait velocity interpretation: Below normal speed for age/gender General Gait Details: ambulates with excessive external rotation LLE with tendency to drag left foot (baseline for patient), no loss of balance, limited secondary to SOB/fatigue, had bleeding from wound plantar surface great toe - RN notified   Stairs            Wheelchair Mobility    Modified Rankin (Stroke Patients Only)       Balance Overall balance assessment: Needs assistance Sitting-balance support: No upper extremity supported;Feet supported Sitting balance-Leahy Scale: Good  Standing balance support: Bilateral upper extremity supported;During functional activity Standing balance-Leahy Scale: Fair                              Cognition Arousal/Alertness: Awake/alert Behavior During Therapy: WFL for tasks assessed/performed Overall Cognitive Status: Within Functional Limits for tasks assessed                                         Exercises General Exercises - Lower Extremity Long Arc Quad: Seated;AROM;Strengthening;Both;10 reps Hip Flexion/Marching: Seated;AROM;Strengthening;Both;10 reps Toe Raises: Seated;AROM;Strengthening;Both;10 reps Heel Raises: Seated;AROM;Strengthening;Both;10 reps    General Comments        Pertinent Vitals/Pain Pain Assessment: No/denies pain    Home Living                      Prior Function            PT Goals (current goals can now be found in the care plan section) Acute Rehab PT Goals PT Goal Formulation: With patient Progress towards PT goals: Progressing toward goals    Frequency    Min 3X/week      PT Plan Current plan remains appropriate    Co-evaluation              AM-PAC PT "6 Clicks" Daily Activity  Outcome Measure  Difficulty turning over in bed (including adjusting bedclothes, sheets and blankets)?: A Lot Difficulty moving from lying on back to sitting on the side of the bed? : A Lot Difficulty sitting down on and standing up from a chair with arms (e.g., wheelchair, bedside commode, etc,.)?: A Little Help needed moving to and from a bed to chair (including a wheelchair)?: A Little Help needed walking in hospital room?: A Little Help needed climbing 3-5 steps with a railing? : A Lot 6 Click Score: 15    End of Session Equipment Utilized During Treatment: Gait belt Activity Tolerance: Patient tolerated treatment well;Patient limited by fatigue Patient left: in chair;with call bell/phone within reach(nursing staff notified patient left in chair) Nurse Communication: Mobility status PT Visit Diagnosis: Unsteadiness on feet (R26.81);History of falling (Z91.81);Muscle weakness (generalized) (M62.81);Other abnormalities of gait and mobility (R26.89);Difficulty in walking, not elsewhere classified (R26.2)     Time: [id number] PT Time Calculation (min) (ACUTE ONLY): 29 min  Charges:   $Therapeutic Activity: 23-37 mins                    G Codes:  Functional Assessment Tool Used: AM-PAC 6 Clicks Basic Mobility Functional Limitation: Mobility: Walking and moving around Mobility: Walking and Moving Around Current Status ([id number]): At least 40 percent but less than 60 percent impaired, limited or restricted Mobility: Walking and Moving Around Goal Status [phone]): At least 40 percent but less than 60 percent impaired, limited or restricted Mobility: Walking and Moving Around Discharge Status [phone]): At least 40 percent but less than 60 percent impaired, limited or restricted    12:02 PM, 10/10/17 Ocie BobJames Rodneisha Bonnet, MPT Physical Therapist with [facility] 336 [phone] office [phone] mobile phone

## 2017-10-10 NOTE — Care Management (Signed)
CM spoke with husband via phone. Patient [facility] go [facility] He reports that patient has all DME needed, she has shower bench, grab bars, WC and BSC. Patient will need resumption orders. [facility] when available. Patient has walked with PT this morning and made improvements from yesterday. Husband aware.

## 2017-10-10 NOTE — Clinical Social Work Note (Signed)
SW following. [facility] declined bed offer to pt. Contacted pt's husband to update. He states that he really doesn't want to try another facility. Pt's husband aware that pt likely does not have Medicare days left at this time anyway. He would like to take pt home and continue with [facility]. Will update RN CM and sign off at this time.

## 2017-10-11 LAB — CBC WITH DIFFERENTIAL/PLATELET
BASOS ABS: 0.1 10*3/uL (ref 0.0–0.1)
BASOS PCT: 1 %
Eosinophils Absolute: 0.4 10*3/uL (ref 0.0–0.7)
Eosinophils Relative: 5 %
HEMATOCRIT: 34.5 % — AB (ref 36.0–46.0)
HEMOGLOBIN: 10.8 g/dL — AB (ref 12.0–15.0)
Lymphocytes Relative: 15 %
Lymphs Abs: 1.3 10*3/uL (ref 0.7–4.0)
MCH: 33.2 pg (ref 26.0–34.0)
MCHC: 31.3 g/dL (ref 30.0–36.0)
MCV: 106.2 fL — ABNORMAL HIGH (ref 78.0–100.0)
MONOS PCT: 20 %
Monocytes Absolute: 1.7 10*3/uL — ABNORMAL HIGH (ref 0.1–1.0)
NEUTROS ABS: 5 10*3/uL (ref 1.7–7.7)
NEUTROS PCT: 59 %
Platelets: 107 10*3/uL — ABNORMAL LOW (ref 150–400)
RBC: 3.25 MIL/uL — AB (ref 3.87–5.11)
RDW: 19.4 % — ABNORMAL HIGH (ref 11.5–15.5)
WBC: 8.4 10*3/uL (ref 4.0–10.5)

## 2017-10-11 LAB — COMPREHENSIVE METABOLIC PANEL
ALBUMIN: 1.8 g/dL — AB (ref 3.5–5.0)
ALT: 94 U/L — ABNORMAL HIGH (ref 14–54)
ANION GAP: 5 (ref 5–15)
AST: 145 U/L — ABNORMAL HIGH (ref 15–41)
Alkaline Phosphatase: 369 U/L — ABNORMAL HIGH (ref 38–126)
BILIRUBIN TOTAL: 3.2 mg/dL — AB (ref 0.3–1.2)
BUN: 41 mg/dL — ABNORMAL HIGH (ref 6–20)
CO2: 23 mmol/L (ref 22–32)
Calcium: 8.6 mg/dL — ABNORMAL LOW (ref 8.9–10.3)
Chloride: 106 mmol/L (ref 101–111)
Creatinine, Ser: 1.65 mg/dL — ABNORMAL HIGH (ref 0.44–1.00)
GFR calc Af Amer: 35 mL/min — ABNORMAL LOW (ref >60)
GFR calc non Af Amer: 30 mL/min — ABNORMAL LOW (ref >60)
GLUCOSE: 155 mg/dL — AB (ref 65–99)
POTASSIUM: 4.8 mmol/L (ref 3.5–5.1)
Sodium: 134 mmol/L — ABNORMAL LOW (ref 135–145)
TOTAL PROTEIN: 5.4 g/dL — AB (ref 6.5–8.1)

## 2017-10-11 LAB — MAGNESIUM: MAGNESIUM: 2.4 mg/dL (ref 1.7–2.4)

## 2017-10-11 LAB — GLUCOSE, CAPILLARY
GLUCOSE-CAPILLARY: 150 mg/dL — AB (ref 65–99)
Glucose-Capillary: 140 mg/dL — ABNORMAL HIGH (ref 65–99)
Glucose-Capillary: 174 mg/dL — ABNORMAL HIGH (ref 65–99)

## 2017-10-11 MED ORDER — DOXYCYCLINE HYCLATE 100 MG PO TABS
100.0000 mg | ORAL_TABLET | Freq: Two times a day (BID) | ORAL | 0 refills | Status: DC
Start: 2017-10-11 — End: 2017-10-27

## 2017-10-11 NOTE — Discharge Summary (Signed)
Physician Discharge Summary  Nancy Baldwin ZOX:[id number] DOB: 1946-08-19 DOA: 10/08/2017  PCP: Ignatius Specking, MD  Admit date: 10/08/2017 Discharge date: 10/11/2017  Time spent: 45 minutes  Recommendations for Outpatient Follow-up:  -To be discharged home today. -Advised to follow-up with PCP in 2 weeks. -We will continue course of doxycycline for an additional 7 days for treatment of right lower extremity cellulitis.  Discharge Diagnoses:  Principal Problem:   Sepsis (HCC) Active Problems:   Diastolic CHF (HCC)   Cirrhosis (HCC)   Hypothyroidism   Type 2 diabetes mellitus with other specified complication (HCC)   Thrombocytopenia (HCC)   Elevated lactic acid level   Cellulitis of right foot   Falls frequently   Altered mental status, unspecified   Dehydration   AKI (acute kidney injury) (HCC)   Hypertension   Discharge Condition: Stable and improved  Filed Weights   10/08/17 0801 10/08/17 1812  Weight: 108.9 kg (240 lb) 115.9 kg (255 lb 8 oz)    History of present illness:  As per Dr. Laural Benes on 11/28: Nancy Baldwin is a 71 y.o. female with chronic liver disease and cirrhosis, chronic diastolic congestive heart failure, anxiety disorder, hypertension, and history of lung cancer who reportedly has frequent falls at home and presented to the emergency department early this morning after a fall at home.  She apparently had been trying to get out of bed when she slid onto the floor and landed on her buttocks.  She reports that her husband was unable to get her up.  EMS brought her to the ED for treatment.  No acute fractures or injuries noted.  She was noted to have an elevated lactic acid level.  She was also noted to have cellulitis of the right lower extremity.  She does have chronic wounds on both lower extremities.  She was also noted to be slightly confused.  Her husband reported that this is normally her baseline but she does have waxing and waning periods of  confusion.  She did have a normal ammonia level.  It is unclear if she is taking her lactulose regularly.  It is unclear if she is taking her other medications regularly.  The patient was noted to have normal blood pressure and vitals.  The patient is being admitted for cellulitis of the right lower extremity and also sepsis associated with right lower extremity infection.  The patient was also noted to be dehydrated and has an acute renal failure.    Hospital Course:   Early sepsis -Source is likely right lower extremity cellulitis. -Sepsis parameters have resolved. -Culture data remains negative at this point. -Was initially on vancomycin and has been transitioned over to doxycycline to complete an extra 7 days of treatment.  Acute on chronic kidney disease stage II -Baseline creatinine appears to be around 0.85. -Creatinine down to 1.65 from 1.8 yesterday. -We will hold Lasix at this time.  Acute metabolic encephalopathy -Resolved, at baseline. -Etiology is unclear.  Chronic diastolic CHF -Appears compensated. -Lasix is being held until seen by PCP due to acute renal failure.  Nash cirrhosis -Compensated.  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  Hypothyroidism -Continue Synthroid  Benign essential hypertension -Well-controlled.  Frequent falls -Seen by physical therapy with recommendations for home health services which have been arranged prior to discharge.     Procedures:  None   Consultations:  None  Discharge Instructions   Allergies as of 10/11/2017      Reactions  Amoxicillin Other (See Comments)   Unknown   Codeine Other (See Comments)   Unknown   Decongest-aid [pseudoephedrine] Other (See Comments)   Unknown   Decongestant [oxymetazoline] Other (See Comments)   Unknown   Metformin And Related Other (See Comments)   Unkown   Tricor [fenofibrate] Other (See Comments)   Unknown      Medication List     STOP taking these medications   furosemide 40 MG tablet Commonly known as:  LASIX   SSD 1 % cream Generic drug:  silver sulfADIAZINE     TAKE these medications   aspirin 81 MG tablet Take 81 mg by mouth daily.   doxycycline 100 MG tablet Commonly known as:  VIBRA-TABS Take 1 tablet (100 mg total) by mouth every 12 (twelve) hours for 7 days.   insulin glargine 100 unit/mL Sopn Commonly known as:  LANTUS Inject 0.15 mLs (15 Units total) into the skin daily before breakfast.   lactulose 10 GM/15ML solution Commonly known as:  CHRONULAC Take 45 mLs (30 g total) by mouth 2 (two) times daily.   levothyroxine 125 MCG tablet Commonly known as:  SYNTHROID, LEVOTHROID Take 1 tablet (125 mcg total) by mouth daily before breakfast.   multivitamin with minerals Tabs tablet Take 1 tablet by mouth daily.   pantoprazole 40 MG tablet Commonly known as:  PROTONIX Take 1 tablet (40 mg total) by mouth daily.   potassium chloride 10 MEQ tablet Commonly known as:  K-DUR Take 1 tablet (10 mEq total) by mouth 2 (two) times daily.   pravastatin 10 MG tablet Commonly known as:  PRAVACHOL Take by mouth daily.   propranolol 20 MG tablet Commonly known as:  INDERAL 20 mg daily.   rifaximin 550 MG Tabs tablet Commonly known as:  XIFAXAN Take 1 tablet (550 mg total) by mouth 2 (two) times daily.   spironolactone 25 MG tablet Commonly known as:  ALDACTONE Take 12.5 mg by mouth 2 (two) times daily.   TUMS 500 MG chewable tablet Generic drug:  calcium carbonate Chew 1 tablet by mouth 2 (two) times daily.   venlafaxine XR 150 MG 24 hr capsule Commonly known as:  EFFEXOR-XR Take 150 mg by mouth 2 (two) times daily.   vitamin C 100 MG tablet Take 100 mg by mouth daily.   Vitamin D (Cholecalciferol) 1000 units Tabs Take 1 tablet by mouth daily.      Allergies  Allergen Reactions  . Amoxicillin Other (See Comments)    Unknown  . Codeine Other (See Comments)    Unknown  .  Decongest-Aid [Pseudoephedrine] Other (See Comments)    Unknown  . Decongestant [Oxymetazoline] Other (See Comments)    Unknown  . Metformin And Related Other (See Comments)    Unkown  . Tricor [Fenofibrate] Other (See Comments)    Unknown   Follow-up Information    Vyas, Dhruv B, MD. Schedule an appointment as soon as possible for a visit in 2 week(s).   Specialty:  Internal Medicine Contact information: [address] [place] [address] [phone]            The results of significant diagnostics from this hospitalization (including imaging, microbiology, ancillary and laboratory) are listed below for reference.    Significant Diagnostic Studies: Dg Ribs Unilateral W/chest Left  Result Date: 10/08/2017 CLINICAL DATA:  Fall, left rib pain. EXAM: LEFT RIBS AND CHEST - 3+ VIEW COMPARISON:  Chest x-ray 08/16/2017 FINDINGS: Multiple left rib fractures are noted, involving at least  ribs 4 through 8 laterally. At least some of these were noted on prior chest x-ray and prior CT from 06/12/2017. Lungs are clear. No effusions or pneumothorax. Heart is borderline in size. IMPRESSION: Multiple left lateral rib fractures, at least ribs 4 through 8. At least some with these were present on prior chest CT and chest x-rays dating back to 06/12/2017. No pneumothorax. Electronically Signed   By: Charlett NoseKevin  Dover M.D.   On: 10/08/2017 09:28   Dg Thoracic Spine 2 View  Result Date: 10/08/2017 CLINICAL DATA:  Back pain after fall today. EXAM: THORACIC SPINE 2 VIEWS COMPARISON:  CT scan of June 12, 2017. FINDINGS: Status post kyphoplasty of T7 and T8. No acute fracture or spondylolisthesis is noted. Disc spaces are well-maintained. Mild S-shaped scoliosis of lower thoracic spine is noted. IMPRESSION: Status post kyphoplasty of T7 and T8 vertebral bodies. No acute abnormality seen in the thoracic spine. Electronically Signed   By: Lupita RaiderJames  Green Jr, M.D.   On: 10/08/2017 09:39   Dg Lumbar Spine  Complete  Result Date: 10/08/2017 CLINICAL DATA:  Low back pain after fall today. EXAM: LUMBAR SPINE - COMPLETE 4+ VIEW COMPARISON:  CT scan of June 12, 2017. FINDINGS: Moderate dextroscoliosis of lumbar spine is noted. Stable old compression fractures of T12 and L1 vertebral bodies is noted. Severe degenerative disc disease is noted at L3-4. Osteopenia is noted. No acute fracture or spondylolisthesis is noted. IMPRESSION: Old T12 and L1 fractures. No acute abnormality seen in the lumbar spine. Electronically Signed   By: Lupita RaiderJames  Green Jr, M.D.   On: 10/08/2017 09:41   Ct Head Wo Contrast  Result Date: 10/08/2017 CLINICAL DATA:  Status post fall.  Poor historian. EXAM: CT HEAD WITHOUT CONTRAST CT CERVICAL SPINE WITHOUT CONTRAST TECHNIQUE: Multidetector CT imaging of the head and cervical spine was performed following the standard protocol without intravenous contrast. Multiplanar CT image reconstructions of the cervical spine were also generated. COMPARISON:  None. FINDINGS: CT HEAD FINDINGS Brain: No evidence of acute infarction, hemorrhage, extra-axial collection, ventriculomegaly, or mass effect. Generalized cerebral atrophy. Periventricular white matter low attenuation likely secondary to microangiopathy. Vascular: Cerebrovascular atherosclerotic calcifications are noted. Skull: Negative for fracture or focal lesion. Sinuses/Orbits: Visualized portions of the orbits are unremarkable. Visualized portions of the paranasal sinuses and mastoid air cells are unremarkable. Other: None. CT CERVICAL SPINE FINDINGS Alignment: Normal. Skull base and vertebrae: No acute fracture. No primary bone lesion or focal pathologic process. Soft tissues and spinal canal: No prevertebral fluid or swelling. No visible canal hematoma. Disc levels: Degenerative disc disease with disc height loss at C5-6, C6-7 C7-T1. Broad-based disc osteophyte complex at C5-6 and C6-7 impressing on the thecal sac. Bilateral uncovertebral  degenerative changes at C6-7 with foraminal encroachment. Upper chest: Lung apices are clear. Other: No fluid collection or hematoma. IMPRESSION: 1. No acute intracranial pathology. 2. No acute osseous injury of the cervical spine. Electronically Signed   By: Elige KoHetal  Patel   On: 10/08/2017 09:49   Ct Cervical Spine Wo Contrast  Result Date: 10/08/2017 CLINICAL DATA:  Status post fall.  Poor historian. EXAM: CT HEAD WITHOUT CONTRAST CT CERVICAL SPINE WITHOUT CONTRAST TECHNIQUE: Multidetector CT imaging of the head and cervical spine was performed following the standard protocol without intravenous contrast. Multiplanar CT image reconstructions of the cervical spine were also generated. COMPARISON:  None. FINDINGS: CT HEAD FINDINGS Brain: No evidence of acute infarction, hemorrhage, extra-axial collection, ventriculomegaly, or mass effect. Generalized cerebral atrophy. Periventricular white matter low attenuation likely  secondary to microangiopathy. Vascular: Cerebrovascular atherosclerotic calcifications are noted. Skull: Negative for fracture or focal lesion. Sinuses/Orbits: Visualized portions of the orbits are unremarkable. Visualized portions of the paranasal sinuses and mastoid air cells are unremarkable. Other: None. CT CERVICAL SPINE FINDINGS Alignment: Normal. Skull base and vertebrae: No acute fracture. No primary bone lesion or focal pathologic process. Soft tissues and spinal canal: No prevertebral fluid or swelling. No visible canal hematoma. Disc levels: Degenerative disc disease with disc height loss at C5-6, C6-7 C7-T1. Broad-based disc osteophyte complex at C5-6 and C6-7 impressing on the thecal sac. Bilateral uncovertebral degenerative changes at C6-7 with foraminal encroachment. Upper chest: Lung apices are clear. Other: No fluid collection or hematoma. IMPRESSION: 1. No acute intracranial pathology. 2. No acute osseous injury of the cervical spine. Electronically Signed   By: Elige Ko   On:  10/08/2017 09:49   Dg Foot Complete Right  Result Date: 10/08/2017 CLINICAL DATA:  Right foot injury due to a fall getting out of bed this morning. Initial encounter. EXAM: RIGHT FOOT COMPLETE - 3+ VIEW COMPARISON:  None. FINDINGS: No acute bony or joint abnormality is identified. Well corticated bone fragment off the dorsal margin of the neck of the talus is most consistent with remote injury. Soft tissues about the foot appear somewhat swollen. IMPRESSION: Soft tissue swelling without acute bony abnormality. Small well corticated bone fragment off the dorsal neck of the talus is likely due to remote injury. Electronically Signed   By: Drusilla Kanner M.D.   On: 10/08/2017 09:34   Dg Hips Bilat W Or Wo Pelvis 3-4 Views  Result Date: 10/08/2017 CLINICAL DATA:  Hip pain after fall this morning. EXAM: DG HIP (WITH OR WITHOUT PELVIS) 3-4V BILAT COMPARISON:  None. FINDINGS: There is no evidence of hip fracture or dislocation. There is no evidence of arthropathy or other focal bone abnormality. IMPRESSION: Normal bilateral hips. Electronically Signed   By: Lupita Raider, M.D.   On: 10/08/2017 09:32    Microbiology: Recent Results (from the past 240 hour(s))  Blood culture (routine x 2)     Status: None (Preliminary result)   Collection Time: 10/08/17  8:42 AM  Result Value Ref Range Status   Specimen Description BLOOD LEFT ARM  Final   Special Requests   Final    BOTTLES DRAWN AEROBIC AND ANAEROBIC Blood Culture adequate volume   Culture NO GROWTH 3 DAYS  Final   Report Status PENDING  Incomplete  Blood culture (routine x 2)     Status: None (Preliminary result)   Collection Time: 10/08/17 10:05 AM  Result Value Ref Range Status   Specimen Description BLOOD RIGHT ARM  Final   Special Requests   Final    BOTTLES DRAWN AEROBIC AND ANAEROBIC Blood Culture adequate volume   Culture NO GROWTH 3 DAYS  Final   Report Status PENDING  Incomplete     Labs: Basic Metabolic Panel: Recent Labs   Lab 10/08/17 0818 10/09/17 0523 10/10/17 0445 10/11/17 0807  NA 136 135 136 134*  K 4.3 4.5 4.9 4.8  CL 102 103 106 106  CO2 25 25 22 23   GLUCOSE 126* 172* 143* 155*  BUN 43* 45* 43* 41*  CREATININE 1.57* 1.74* 1.80* 1.65*  CALCIUM 9.3 8.8* 8.5* 8.6*  MG  --  2.3 2.4 2.4   Liver Function Tests: Recent Labs  Lab 10/08/17 0818 10/09/17 0523 10/10/17 0445 10/11/17 0807  AST 159* 168* 166* 145*  ALT 108* 106* 102* 94*  ALKPHOS 414* 386* 392* 369*  BILITOT 4.0* 3.9* 3.5* 3.2*  PROT 5.9* 5.7* 5.7* 5.4*  ALBUMIN 2.2* 2.1* 2.0* 1.8*   No results for input(s): LIPASE, AMYLASE in the last 168 hours. Recent Labs  Lab 10/08/17 0818  AMMONIA 33   CBC: Recent Labs  Lab 10/08/17 0818 10/09/17 0523 10/10/17 0445 10/11/17 0807  WBC 7.8 8.0 7.9 8.4  NEUTROABS 5.1 4.7 4.6 5.0  HGB 11.7* 11.1* 11.9* 10.8*  HCT 37.2 35.2* 38.1 34.5*  MCV 106.6* 108.0* 107.3* 106.2*  PLT 119* 122* 112* 107*   Cardiac Enzymes: Recent Labs  Lab 10/08/17 0840  CKTOTAL 103   BNP: BNP (last 3 results) Recent Labs    06/12/17 1244 06/14/17 0624  BNP 539.0* 306.0*    ProBNP (last 3 results) No results for input(s): PROBNP in the last 8760 hours.  CBG: Recent Labs  Lab 10/10/17 1125 10/10/17 1641 10/10/17 2131 10/11/17 0749 10/11/17 1120  GLUCAP 229* 154* 200* 140* 150*       Signed:  Chaya JanEstela Hernandez Acosta  [facility] Pager: [phone] 10/11/2017, 4:01 PM

## 2017-10-11 NOTE — Progress Notes (Signed)
Patient states understanding of discharge intructions, prescription given

## 2017-10-13 LAB — CULTURE, BLOOD (ROUTINE X 2)
CULTURE: NO GROWTH
CULTURE: NO GROWTH
Special Requests: ADEQUATE
Special Requests: ADEQUATE

## 2017-10-14 ENCOUNTER — Encounter (HOSPITAL_COMMUNITY): Payer: Self-pay | Admitting: *Deleted

## 2017-10-14 ENCOUNTER — Emergency Department (HOSPITAL_COMMUNITY): Payer: Medicare Other

## 2017-10-14 ENCOUNTER — Other Ambulatory Visit: Payer: Self-pay

## 2017-10-14 ENCOUNTER — Emergency Department (HOSPITAL_COMMUNITY)
Admission: EM | Admit: 2017-10-14 | Discharge: 2017-10-14 | Disposition: A | Discharge status: Home / Self Care | Payer: Medicare Other | Attending: Emergency Medicine | Admitting: Emergency Medicine

## 2017-10-14 DIAGNOSIS — Z85118 Personal history of other malignant neoplasm of bronchus and lung: Secondary | ICD-10-CM | POA: Diagnosis not present

## 2017-10-14 DIAGNOSIS — I110 Hypertensive heart disease with heart failure: Secondary | ICD-10-CM | POA: Insufficient documentation

## 2017-10-14 DIAGNOSIS — R531 Weakness: Secondary | ICD-10-CM

## 2017-10-14 DIAGNOSIS — R0602 Shortness of breath: Secondary | ICD-10-CM

## 2017-10-14 DIAGNOSIS — Z9181 History of falling: Secondary | ICD-10-CM | POA: Diagnosis not present

## 2017-10-14 DIAGNOSIS — R42 Dizziness and giddiness: Secondary | ICD-10-CM | POA: Insufficient documentation

## 2017-10-14 DIAGNOSIS — Z79899 Other long term (current) drug therapy: Secondary | ICD-10-CM | POA: Diagnosis not present

## 2017-10-14 DIAGNOSIS — I5030 Unspecified diastolic (congestive) heart failure: Secondary | ICD-10-CM | POA: Insufficient documentation

## 2017-10-14 DIAGNOSIS — Z87891 Personal history of nicotine dependence: Secondary | ICD-10-CM | POA: Insufficient documentation

## 2017-10-14 DIAGNOSIS — E119 Type 2 diabetes mellitus without complications: Secondary | ICD-10-CM | POA: Diagnosis not present

## 2017-10-14 DIAGNOSIS — E039 Hypothyroidism, unspecified: Secondary | ICD-10-CM | POA: Diagnosis not present

## 2017-10-14 DIAGNOSIS — R609 Edema, unspecified: Secondary | ICD-10-CM | POA: Diagnosis not present

## 2017-10-14 DIAGNOSIS — W19XXXA Unspecified fall, initial encounter: Secondary | ICD-10-CM | POA: Diagnosis not present

## 2017-10-14 DIAGNOSIS — Z794 Long term (current) use of insulin: Secondary | ICD-10-CM | POA: Diagnosis not present

## 2017-10-14 LAB — COMPREHENSIVE METABOLIC PANEL
ALT: 91 U/L — ABNORMAL HIGH (ref 14–54)
ANION GAP: 4 — AB (ref 5–15)
AST: 138 U/L — AB (ref 15–41)
Albumin: 1.9 g/dL — ABNORMAL LOW (ref 3.5–5.0)
Alkaline Phosphatase: 402 U/L — ABNORMAL HIGH (ref 38–126)
BILIRUBIN TOTAL: 3.4 mg/dL — AB (ref 0.3–1.2)
BUN: 41 mg/dL — ABNORMAL HIGH (ref 6–20)
CHLORIDE: 110 mmol/L (ref 101–111)
CO2: 23 mmol/L (ref 22–32)
Calcium: 8.8 mg/dL — ABNORMAL LOW (ref 8.9–10.3)
Creatinine, Ser: 1.48 mg/dL — ABNORMAL HIGH (ref 0.44–1.00)
GFR, EST AFRICAN AMERICAN: 40 mL/min — AB (ref >60)
GFR, EST NON AFRICAN AMERICAN: 35 mL/min — AB (ref >60)
Glucose, Bld: 129 mg/dL — ABNORMAL HIGH (ref 65–99)
POTASSIUM: 5 mmol/L (ref 3.5–5.1)
Sodium: 137 mmol/L (ref 135–145)
TOTAL PROTEIN: 5.6 g/dL — AB (ref 6.5–8.1)

## 2017-10-14 LAB — CBC WITH DIFFERENTIAL/PLATELET
BASOS PCT: 1 %
Basophils Absolute: 0.1 10*3/uL (ref 0.0–0.1)
EOS ABS: 0.4 10*3/uL (ref 0.0–0.7)
EOS PCT: 4 %
HCT: 39.1 % (ref 36.0–46.0)
Hemoglobin: 12.1 g/dL (ref 12.0–15.0)
LYMPHS ABS: 1.3 10*3/uL (ref 0.7–4.0)
Lymphocytes Relative: 12 %
MCH: 33.2 pg (ref 26.0–34.0)
MCHC: 30.9 g/dL (ref 30.0–36.0)
MCV: 107.4 fL — ABNORMAL HIGH (ref 78.0–100.0)
Monocytes Absolute: 1.9 10*3/uL — ABNORMAL HIGH (ref 0.1–1.0)
Monocytes Relative: 19 %
NEUTROS PCT: 64 %
Neutro Abs: 6.6 10*3/uL (ref 1.7–7.7)
PLATELETS: 134 10*3/uL — AB (ref 150–400)
RBC: 3.64 MIL/uL — AB (ref 3.87–5.11)
RDW: 19 % — ABNORMAL HIGH (ref 11.5–15.5)
WBC: 10.4 10*3/uL (ref 4.0–10.5)

## 2017-10-14 LAB — URINALYSIS, ROUTINE W REFLEX MICROSCOPIC
BILIRUBIN URINE: NEGATIVE
Glucose, UA: NEGATIVE mg/dL
Hgb urine dipstick: NEGATIVE
KETONES UR: NEGATIVE mg/dL
LEUKOCYTES UA: NEGATIVE
NITRITE: NEGATIVE
PH: 6 (ref 5.0–8.0)
PROTEIN: NEGATIVE mg/dL
Specific Gravity, Urine: 1.02 (ref 1.005–1.030)

## 2017-10-14 LAB — I-STAT CG4 LACTIC ACID, ED: LACTIC ACID, VENOUS: 1.81 mmol/L (ref 0.5–1.9)

## 2017-10-14 LAB — BRAIN NATRIURETIC PEPTIDE: B NATRIURETIC PEPTIDE 5: 431 pg/mL — AB (ref 0.0–100.0)

## 2017-10-14 LAB — AMMONIA: AMMONIA: 47 umol/L — AB (ref 9–35)

## 2017-10-14 MED ORDER — LACTULOSE 10 GM/15ML PO SOLN: 30.0000 g | Freq: Two times a day (BID) | ORAL | 0 refills | Status: AC

## 2017-10-14 MED ORDER — FUROSEMIDE 20 MG PO TABS: 20.0000 mg | ORAL_TABLET | ORAL | 0 refills | Status: DC

## 2017-10-14 NOTE — ED Notes (Signed)
Patient transported to X-ray 

## 2017-10-14 NOTE — Care Management (Signed)
CM called back to ED due to pt's husband "not wanting to take her home". CM asked husband about barriers to taking pt home. He voiced concern about getting her into the house, CM offered EMS transport and husband states pt can walk with a RW and he can get help if she has trouble. Husband asking about why lasix was stopped at DC on Saturday. He reports pt becoming SOB and wheezing with movement. CM reviewed chart to see that lasix was held due to AKI, pt cont to get spirolactone. He says pt has no lactulose at home, was not given Rx when she discharged from nursing home and MD though they had some at home for Saturday's DC. CM discussed medication concerns with MD. Rx will be provided for lactulose and lasix to be started back every other day.

## 2017-10-14 NOTE — ED Provider Notes (Signed)
[facility] EMERGENCY DEPARTMENT Provider Note   CSN: [id number] Arrival date & time: 10/14/17  0458     History   Chief Complaint Chief Complaint  Patient presents with  . Fall    HPI Nancy Baldwin is a 71 y.o. female.  Patient brought to the ER by ambulance from home after a fall.  Patient was reportedly trying to get to the bathroom when she fell.  She is not sure exactly how the fall occurred, although she has a history of frequent falls with chronic dizziness and generalized weakness.  She apparently fell to her knees while in the bathroom and could not get back up.  EMS report that she was still in the kneeling position when they got to her house.  Patient was just discharged from the hospital 3 days ago.  She was hospitalized under similar conditions, have been falling at home and being found to have cellulitis of her foot resulting in likely sepsis.  She is currently on doxycycline.  She had acute renal failure at arrival, her Lasix stopped at discharge.  She continues to have multiple open wounds on her extremities and is weeping serosanguineous fluid.  Patient complained of shortness of breath with EMS.      Past Medical History:  Diagnosis Date  . Anxiety   . CHF (congestive heart failure) (HCC)   . Depression   . Hypertension   . Lung cancer [facility])     Patient Active Problem List   Diagnosis Date Noted  . Sepsis (HCC) 10/08/2017  . Elevated lactic acid level 10/08/2017  . Cellulitis of right foot 10/08/2017  . Falls frequently 10/08/2017  . Altered mental status, unspecified 10/08/2017  . Dehydration 10/08/2017  . AKI (acute kidney injury) (HCC) 10/08/2017  . Hypertension 10/08/2017  . Type 2 diabetes mellitus with other specified complication (HCC) 06/18/2017  . Thrombocytopenia (HCC) 06/18/2017  . Multiple closed fractures of ribs of left side   . T12 compression fracture (HCC) 06/13/2017  . 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 Intractable pain 06/12/2017  . Diastolic CHF (HCC) 02/07/2014  . Cirrhosis (HCC) 02/07/2014  . Hypothyroidism 02/07/2014  . Anxiety state, unspecified 02/07/2014  . Depression 02/07/2014    Past Surgical History:  Procedure Laterality Date  . ABDOMINAL HYSTERECTOMY    . TONSILLECTOMY AND ADENOIDECTOMY      OB History    No data available       Home Medications    Prior to Admission medications   Medication Sig Start Date End Date Taking? Authorizing Provider  Ascorbic Acid (VITAMIN C) 100 MG tablet Take 100 mg by mouth daily.    [provider]  aspirin 81 MG tablet Take 81 mg by mouth daily.    [provider]  calcium carbonate (TUMS) 500 MG chewable tablet Chew 1 tablet by mouth 2 (two) times daily.    [provider]  doxycycline (VIBRA-TABS) 100 MG tablet Take 1 tablet (100 mg total) by mouth every 12 (twelve) hours for 7 days. 10/11/17 10/18/17  Philip Aspen, Limmie Patricia, MD  insulin glargine (LANTUS) 100 unit/mL SOPN Inject 0.15 mLs (15 Units total) into the skin daily before breakfast. 06/19/17   Elliot Cousin, MD  lactulose (CHRONULAC) 10 GM/15ML solution Take 45 mLs (30 g total) by mouth 2 (two) times daily. 06/19/17   Elliot Cousin, MD  levothyroxine (SYNTHROID, LEVOTHROID) 125 MCG tablet Take 1 tablet (125 mcg total) by mouth daily  before breakfast. 06/20/17   Elliot CousinFisher, Denise, MD  Multiple Vitamin (MULTIVITAMIN WITH MINERALS) TABS tablet Take 1 tablet by mouth daily.    [provider]  pantoprazole (PROTONIX) 40 MG tablet Take 1 tablet (40 mg total) by mouth daily. Patient not taking: Reported on 10/08/2017 06/20/17   Elliot CousinFisher, Denise, MD  potassium chloride (K-DUR) 10 MEQ tablet Take 1 tablet (10 mEq total) by mouth 2 (two) times daily. 06/19/17   Elliot CousinFisher, Denise, MD  pravastatin (PRAVACHOL) 10 MG tablet Take by mouth daily.  06/11/17   [provider]  propranolol (INDERAL) 20 MG tablet 20 mg daily.   06/11/17   [provider]  rifaximin (XIFAXAN) 550 MG TABS tablet Take 1 tablet (550 mg total) by mouth 2 (two) times daily. 06/19/17   Elliot CousinFisher, Denise, MD  spironolactone (ALDACTONE) 25 MG tablet Take 12.5 mg by mouth 2 (two) times daily.    [provider]  venlafaxine XR (EFFEXOR-XR) 150 MG 24 hr capsule Take 150 mg by mouth 2 (two) times daily.     [provider]  Vitamin D, Cholecalciferol, 1000 units TABS Take 1 tablet by mouth daily.    [provider]    Family History Family History  Problem Relation Age of Onset  . Lung cancer Mother   . Heart disease Father   . Kidney disease Father   . Heart attack Father     Social History Social History   Tobacco Use  . Smoking status: Former Games developermoker  . Smokeless tobacco: Never Used  Substance Use Topics  . Alcohol use: No  . Drug use: No     Allergies   Amoxicillin; Codeine; Decongest-aid [pseudoephedrine]; Decongestant [oxymetazoline]; Metformin and related; and Tricor [fenofibrate]   Review of Systems Review of Systems  Respiratory: Positive for shortness of breath.   Musculoskeletal: Positive for arthralgias.  Neurological: Positive for dizziness and weakness.     Physical Exam Updated Vital Signs BP 131/62   Pulse 63   Resp 16   Ht 5\' 7"  (1.702 m)   Wt 115.7 kg (255 lb)   SpO2 100%   BMI 39.94 kg/m   Physical Exam  Constitutional: She is oriented to person, place, and time. She appears well-developed and well-nourished. No distress.  HENT:  Head: Normocephalic and atraumatic.  Right Ear: Hearing normal.  Left Ear: Hearing normal.  Nose: Nose normal.  Mouth/Throat: Oropharynx is clear and moist and mucous membranes are normal.  Eyes: Conjunctivae and EOM are normal. Pupils are equal, round, and reactive to light.  Neck: Normal range of motion. Neck supple.  Cardiovascular: Regular rhythm, S1 normal and S2 normal. Exam reveals no gallop and no friction rub.  No murmur  heard. Pulmonary/Chest: Effort normal and breath sounds normal. No respiratory distress. She exhibits no tenderness.  Abdominal: Soft. Normal appearance and bowel sounds are normal. There is no hepatosplenomegaly. There is no tenderness. There is no rebound, no guarding, no tenderness at McBurney's point and negative Murphy's sign. No hernia.  Musculoskeletal: Normal range of motion. She exhibits edema.       Right knee: She exhibits no deformity. Tenderness found.       Left knee: She exhibits no deformity. Tenderness found.  Neurological: She is alert and oriented to person, place, and time. She has normal strength. No cranial nerve deficit or sensory deficit. Coordination normal. GCS eye subscore is 4. GCS verbal subscore is 5. GCS motor subscore is 6.  Skin: Skin is warm, dry and intact.  No rash noted. No cyanosis.  Multiple skin tears and open wounds of both lower extremities and forearms with serosanguineous drainage  Psychiatric: She has a normal mood and affect. Her speech is normal and behavior is normal. Thought content normal.  Nursing note and vitals reviewed.    ED Treatments / Results  Labs (all labs ordered are listed, but only abnormal results are displayed) Labs Reviewed  COMPREHENSIVE METABOLIC PANEL - Abnormal; Notable for the following components:      Result Value   Glucose, Bld 129 (*)    BUN 41 (*)    Creatinine, Ser 1.48 (*)    Calcium 8.8 (*)    Total Protein 5.6 (*)    Albumin 1.9 (*)    AST 138 (*)    ALT 91 (*)    Alkaline Phosphatase 402 (*)    Total Bilirubin 3.4 (*)    GFR calc non Af Amer 35 (*)    GFR calc Af Amer 40 (*)    Anion gap 4 (*)    All other components within normal limits  AMMONIA - Abnormal; Notable for the following components:   Ammonia 47 (*)    All other components within normal limits  BRAIN NATRIURETIC PEPTIDE - Abnormal; Notable for the following components:   B Natriuretic Peptide 431.0 (*)    All other components within  normal limits  CBC WITH DIFFERENTIAL/PLATELET - Abnormal; Notable for the following components:   RBC 3.64 (*)    MCV 107.4 (*)    RDW 19.0 (*)    Platelets 134 (*)    Monocytes Absolute 1.9 (*)    All other components within normal limits  CBC WITH DIFFERENTIAL/PLATELET  URINALYSIS, ROUTINE W REFLEX MICROSCOPIC  I-STAT CG4 LACTIC ACID, ED    EKG  EKG Interpretation  Date/Time:  Tuesday October 14 2017 05:34:11 EST Ventricular Rate:  65 PR Interval:    QRS Duration: 138 QT Interval:  475 QTC Calculation: 494 R Axis:   23 Text Interpretation:  Sinus rhythm Nonspecific intraventricular conduction delay Anteroseptal infarct, age indeterminate No significant change since last tracing Confirmed by Gilda Crease [id number]) on 10/14/2017 5:37:30 AM       Radiology No results found.  Procedures Procedures (including critical care time)  Medications Ordered in ED Medications - No data to display   Initial Impression / Assessment and Plan / ED Course  I have reviewed the triage vital signs and the nursing notes.  Pertinent labs & imaging results that were available during my care of the patient were reviewed by me and considered in my medical decision making (see chart for details).     Patient presents to the emergency department for evaluation after a fall.  Patient was going to the bathroom when she fell forward and landed on her knees.  She is brought to the ER from home by ambulance.  She is complaining of bilateral knee pain.  Patient was just hospitalized for cellulitis of the right foot.  She has significant chronic edema secondary to her cirrhosis.  She has multiple small wounds on her extremities that are weeping serosanguineous fluid, but no signs of recurrent cellulitis or significant infection.  She does not have an elevated lactic acid.  She is afebrile.  Her white count is normal.  No signs of recurrent sepsis.  The only abnormality on her workup is that she  has a slightly elevated ammonia.  Reviewing records it has been suspected that she is noncompliant with  her lactulose.  Will require using her lactulose at home, but does not meet any criteria or have any need for admission at this time.  Will likely require case management help for discharge.  X-rays pending at this time.  Final Clinical Impressions(s) / ED Diagnoses   Final diagnoses:  Generalized weakness  Fall, initial encounter    ED Discharge Orders    None       Mayumi Summerson, Canary Brimhristopher J, MD 10/14/17 [phone]

## 2017-10-14 NOTE — ED Notes (Addendum)
Husband at bedside.  

## 2017-10-14 NOTE — ED Notes (Addendum)
Pt and spouse are concerned about pt going home because she is weeping fluid and is concerned about her getting from the car to the house.  Notified Dr. Fayrene FearingJames and he instructed to notify case management to come talk with pt again.

## 2017-10-14 NOTE — Care Management (Signed)
CM consult received. CM met with pt and pt's husband at the bedside. CM inquired on any changes since DC last week, If they had all DME needed, if he felt he had the support he needed, if more [facility] services were needed. Pt's husband denied any needs. HH RN made visit yesterday. Husband reports no needs and feels comfortable discharging home with previous arrangements.

## 2017-10-14 NOTE — ED Triage Notes (Signed)
Pt got up to go to bathroom and fell on her knees; when ems arrived on scene pt was found in bathroom floor on her knees lying over the bathtub; pt's cbg 135; pt c/o bilateral knee pain; pt states she thinks she tripped and fell

## 2017-10-14 NOTE — Discharge Instructions (Addendum)
Make sure that you take your lactulose.  This helps treat dizziness and weakness secondary to your liver disease.  Your ammonia was very slightly elevated today.  Take Lasix every OTHER day only, to prevent swelling.

## 2017-10-18 ENCOUNTER — Inpatient Hospital Stay: Admit: 2017-10-18 | Payer: Self-pay | Admitting: Pulmonary Disease

## 2017-10-18 ENCOUNTER — Emergency Department (HOSPITAL_COMMUNITY): Payer: Medicare Other

## 2017-10-18 ENCOUNTER — Encounter (HOSPITAL_COMMUNITY): Payer: Self-pay | Admitting: *Deleted

## 2017-10-18 ENCOUNTER — Other Ambulatory Visit: Payer: Self-pay

## 2017-10-18 ENCOUNTER — Inpatient Hospital Stay (HOSPITAL_COMMUNITY)
Admission: EM | Admit: 2017-10-18 | Discharge: 2017-10-27 | DRG: 871 | Disposition: A | Discharge status: Hospice | Payer: Medicare Other | Attending: Internal Medicine | Admitting: Internal Medicine

## 2017-10-18 DIAGNOSIS — I5032 Chronic diastolic (congestive) heart failure: Secondary | ICD-10-CM | POA: Diagnosis present

## 2017-10-18 DIAGNOSIS — G9340 Encephalopathy, unspecified: Secondary | ICD-10-CM

## 2017-10-18 DIAGNOSIS — D689 Coagulation defect, unspecified: Secondary | ICD-10-CM | POA: Diagnosis present

## 2017-10-18 DIAGNOSIS — Z87891 Personal history of nicotine dependence: Secondary | ICD-10-CM

## 2017-10-18 DIAGNOSIS — Z7982 Long term (current) use of aspirin: Secondary | ICD-10-CM

## 2017-10-18 DIAGNOSIS — R401 Stupor: Secondary | ICD-10-CM | POA: Diagnosis not present

## 2017-10-18 DIAGNOSIS — R680 Hypothermia, not associated with low environmental temperature: Secondary | ICD-10-CM | POA: Diagnosis present

## 2017-10-18 DIAGNOSIS — R600 Localized edema: Secondary | ICD-10-CM | POA: Diagnosis present

## 2017-10-18 DIAGNOSIS — E039 Hypothyroidism, unspecified: Secondary | ICD-10-CM

## 2017-10-18 DIAGNOSIS — Z6841 Body Mass Index (BMI) 40.0 and over, adult: Secondary | ICD-10-CM | POA: Diagnosis not present

## 2017-10-18 DIAGNOSIS — K7290 Hepatic failure, unspecified without coma: Secondary | ICD-10-CM | POA: Diagnosis present

## 2017-10-18 DIAGNOSIS — N179 Acute kidney failure, unspecified: Secondary | ICD-10-CM | POA: Diagnosis present

## 2017-10-18 DIAGNOSIS — I130 Hypertensive heart and chronic kidney disease with heart failure and stage 1 through stage 4 chronic kidney disease, or unspecified chronic kidney disease: Secondary | ICD-10-CM | POA: Diagnosis present

## 2017-10-18 DIAGNOSIS — L03119 Cellulitis of unspecified part of limb: Secondary | ICD-10-CM

## 2017-10-18 DIAGNOSIS — N183 Chronic kidney disease, stage 3 (moderate): Secondary | ICD-10-CM | POA: Diagnosis present

## 2017-10-18 DIAGNOSIS — E43 Unspecified severe protein-calorie malnutrition: Secondary | ICD-10-CM | POA: Diagnosis present

## 2017-10-18 DIAGNOSIS — E1151 Type 2 diabetes mellitus with diabetic peripheral angiopathy without gangrene: Secondary | ICD-10-CM | POA: Diagnosis present

## 2017-10-18 DIAGNOSIS — Z515 Encounter for palliative care: Secondary | ICD-10-CM

## 2017-10-18 DIAGNOSIS — K7460 Unspecified cirrhosis of liver: Secondary | ICD-10-CM | POA: Diagnosis present

## 2017-10-18 DIAGNOSIS — D696 Thrombocytopenia, unspecified: Secondary | ICD-10-CM | POA: Diagnosis present

## 2017-10-18 DIAGNOSIS — F419 Anxiety disorder, unspecified: Secondary | ICD-10-CM | POA: Diagnosis present

## 2017-10-18 DIAGNOSIS — R601 Generalized edema: Secondary | ICD-10-CM | POA: Diagnosis not present

## 2017-10-18 DIAGNOSIS — E875 Hyperkalemia: Secondary | ICD-10-CM | POA: Diagnosis present

## 2017-10-18 DIAGNOSIS — K7581 Nonalcoholic steatohepatitis (NASH): Secondary | ICD-10-CM

## 2017-10-18 DIAGNOSIS — F329 Major depressive disorder, single episode, unspecified: Secondary | ICD-10-CM | POA: Diagnosis present

## 2017-10-18 DIAGNOSIS — Z66 Do not resuscitate: Secondary | ICD-10-CM | POA: Diagnosis not present

## 2017-10-18 DIAGNOSIS — E872 Acidosis: Secondary | ICD-10-CM | POA: Diagnosis present

## 2017-10-18 DIAGNOSIS — L03116 Cellulitis of left lower limb: Secondary | ICD-10-CM | POA: Diagnosis present

## 2017-10-18 DIAGNOSIS — L89899 Pressure ulcer of other site, unspecified stage: Secondary | ICD-10-CM | POA: Diagnosis present

## 2017-10-18 DIAGNOSIS — N049 Nephrotic syndrome with unspecified morphologic changes: Secondary | ICD-10-CM | POA: Diagnosis not present

## 2017-10-18 DIAGNOSIS — D638 Anemia in other chronic diseases classified elsewhere: Secondary | ICD-10-CM | POA: Diagnosis present

## 2017-10-18 DIAGNOSIS — J9690 Respiratory failure, unspecified, unspecified whether with hypoxia or hypercapnia: Secondary | ICD-10-CM

## 2017-10-18 DIAGNOSIS — R627 Adult failure to thrive: Secondary | ICD-10-CM | POA: Diagnosis present

## 2017-10-18 DIAGNOSIS — R161 Splenomegaly, not elsewhere classified: Secondary | ICD-10-CM | POA: Diagnosis present

## 2017-10-18 DIAGNOSIS — R451 Restlessness and agitation: Secondary | ICD-10-CM | POA: Diagnosis not present

## 2017-10-18 DIAGNOSIS — Z90710 Acquired absence of both cervix and uterus: Secondary | ICD-10-CM

## 2017-10-18 DIAGNOSIS — R6521 Severe sepsis with septic shock: Secondary | ICD-10-CM | POA: Diagnosis present

## 2017-10-18 DIAGNOSIS — F0390 Unspecified dementia without behavioral disturbance: Secondary | ICD-10-CM | POA: Diagnosis present

## 2017-10-18 DIAGNOSIS — E1122 Type 2 diabetes mellitus with diabetic chronic kidney disease: Secondary | ICD-10-CM | POA: Diagnosis present

## 2017-10-18 DIAGNOSIS — L0390 Cellulitis, unspecified: Secondary | ICD-10-CM | POA: Diagnosis not present

## 2017-10-18 DIAGNOSIS — Z781 Physical restraint status: Secondary | ICD-10-CM

## 2017-10-18 DIAGNOSIS — L03115 Cellulitis of right lower limb: Secondary | ICD-10-CM | POA: Diagnosis present

## 2017-10-18 DIAGNOSIS — Z794 Long term (current) use of insulin: Secondary | ICD-10-CM

## 2017-10-18 DIAGNOSIS — A419 Sepsis, unspecified organism: Principal | ICD-10-CM | POA: Diagnosis present

## 2017-10-18 DIAGNOSIS — Z885 Allergy status to narcotic agent status: Secondary | ICD-10-CM

## 2017-10-18 DIAGNOSIS — D539 Nutritional anemia, unspecified: Secondary | ICD-10-CM | POA: Diagnosis present

## 2017-10-18 DIAGNOSIS — Z79899 Other long term (current) drug therapy: Secondary | ICD-10-CM

## 2017-10-18 LAB — CBC WITH DIFFERENTIAL/PLATELET
BASOS ABS: 0 10*3/uL (ref 0.0–0.1)
BASOS ABS: 0 10*3/uL (ref 0.0–0.1)
BASOS PCT: 0 %
Basophils Relative: 0 %
EOS ABS: 0 10*3/uL (ref 0.0–0.7)
EOS PCT: 1 %
Eosinophils Absolute: 0 10*3/uL (ref 0.0–0.7)
Eosinophils Relative: 1 %
HCT: 38.6 % (ref 36.0–46.0)
HEMATOCRIT: 29.7 % — AB (ref 36.0–46.0)
Hemoglobin: 11.9 g/dL — ABNORMAL LOW (ref 12.0–15.0)
Hemoglobin: 9.3 g/dL — ABNORMAL LOW (ref 12.0–15.0)
LYMPHS PCT: 15 %
Lymphocytes Relative: 22 %
Lymphs Abs: 0.5 10*3/uL — ABNORMAL LOW (ref 0.7–4.0)
Lymphs Abs: 0.6 10*3/uL — ABNORMAL LOW (ref 0.7–4.0)
MCH: 33.4 pg (ref 26.0–34.0)
MCH: 33.7 pg (ref 26.0–34.0)
MCHC: 30.8 g/dL (ref 30.0–36.0)
MCHC: 31.3 g/dL (ref 30.0–36.0)
MCV: 108.4 fL — AB (ref 78.0–100.0)
MCV: 107.6 fL — AB (ref 78.0–100.0)
MONO ABS: 0.2 10*3/uL (ref 0.1–1.0)
MONO ABS: 0.2 10*3/uL (ref 0.1–1.0)
MONOS PCT: 8 %
Monocytes Relative: 6 %
NEUTROS ABS: 1.7 10*3/uL (ref 1.7–7.7)
Neutro Abs: 2.5 10*3/uL (ref 1.7–7.7)
Neutrophils Relative %: 78 %
Neutrophils Relative %: 69 %
PLATELETS: 112 10*3/uL — AB (ref 150–400)
PLATELETS: 83 10*3/uL — AB (ref 150–400)
RBC: 3.56 MIL/uL — ABNORMAL LOW (ref 3.87–5.11)
RBC: 2.76 MIL/uL — ABNORMAL LOW (ref 3.87–5.11)
RDW: 18.7 % — ABNORMAL HIGH (ref 11.5–15.5)
RDW: 18.9 % — AB (ref 11.5–15.5)
WBC: 3.2 10*3/uL — ABNORMAL LOW (ref 4.0–10.5)
WBC: 2.5 10*3/uL — ABNORMAL LOW (ref 4.0–10.5)

## 2017-10-18 LAB — COMPREHENSIVE METABOLIC PANEL
ALBUMIN: 2 g/dL — AB (ref 3.5–5.0)
ALBUMIN: 1.5 g/dL — AB (ref 3.5–5.0)
ALK PHOS: 419 U/L — AB (ref 38–126)
ALK PHOS: 314 U/L — AB (ref 38–126)
ALT: 95 U/L — AB (ref 14–54)
ALT: 69 U/L — AB (ref 14–54)
ANION GAP: 9 (ref 5–15)
ANION GAP: 8 (ref 5–15)
AST: 141 U/L — ABNORMAL HIGH (ref 15–41)
AST: 103 U/L — AB (ref 15–41)
BILIRUBIN TOTAL: 4 mg/dL — AB (ref 0.3–1.2)
BUN: 50 mg/dL — ABNORMAL HIGH (ref 6–20)
BUN: 50 mg/dL — AB (ref 6–20)
CALCIUM: 9.2 mg/dL (ref 8.9–10.3)
CALCIUM: 7.9 mg/dL — AB (ref 8.9–10.3)
CHLORIDE: 106 mmol/L (ref 101–111)
CO2: 22 mmol/L (ref 22–32)
CO2: 18 mmol/L — AB (ref 22–32)
Chloride: 110 mmol/L (ref 101–111)
Creatinine, Ser: 1.92 mg/dL — ABNORMAL HIGH (ref 0.44–1.00)
Creatinine, Ser: 1.81 mg/dL — ABNORMAL HIGH (ref 0.44–1.00)
GFR calc Af Amer: 32 mL/min — ABNORMAL LOW (ref >60)
GFR calc non Af Amer: 25 mL/min — ABNORMAL LOW (ref >60)
GFR calc non Af Amer: 27 mL/min — ABNORMAL LOW (ref >60)
GFR, EST AFRICAN AMERICAN: 29 mL/min — AB (ref >60)
GLUCOSE: 116 mg/dL — AB (ref 65–99)
GLUCOSE: 123 mg/dL — AB (ref 65–99)
Potassium: 5.2 mmol/L — ABNORMAL HIGH (ref 3.5–5.1)
Potassium: 5 mmol/L (ref 3.5–5.1)
SODIUM: 137 mmol/L (ref 135–145)
SODIUM: 136 mmol/L (ref 135–145)
TOTAL PROTEIN: 4.3 g/dL — AB (ref 6.5–8.1)
Total Bilirubin: 5.1 mg/dL — ABNORMAL HIGH (ref 0.3–1.2)
Total Protein: 5.8 g/dL — ABNORMAL LOW (ref 6.5–8.1)

## 2017-10-18 LAB — PROCALCITONIN: PROCALCITONIN: 2.67 ng/mL

## 2017-10-18 LAB — PHOSPHORUS: Phosphorus: 5.5 mg/dL — ABNORMAL HIGH (ref 2.5–4.6)

## 2017-10-18 LAB — URINALYSIS, ROUTINE W REFLEX MICROSCOPIC
BILIRUBIN URINE: NEGATIVE
GLUCOSE, UA: NEGATIVE mg/dL
HGB URINE DIPSTICK: NEGATIVE
Ketones, ur: NEGATIVE mg/dL
Leukocytes, UA: NEGATIVE
Nitrite: NEGATIVE
PH: 5 (ref 5.0–8.0)
Protein, ur: NEGATIVE mg/dL
SPECIFIC GRAVITY, URINE: 1.017 (ref 1.005–1.030)

## 2017-10-18 LAB — TSH: TSH: 37.194 u[IU]/mL — ABNORMAL HIGH (ref 0.350–4.500)

## 2017-10-18 LAB — T4, FREE: FREE T4: 0.86 ng/dL (ref 0.61–1.12)

## 2017-10-18 LAB — PROTIME-INR
INR: 2.11
Prothrombin Time: 23.4 seconds — ABNORMAL HIGH (ref 11.4–15.2)

## 2017-10-18 LAB — BRAIN NATRIURETIC PEPTIDE: B Natriuretic Peptide: 1302 pg/mL — ABNORMAL HIGH (ref 0.0–100.0)

## 2017-10-18 LAB — CREATININE, SERUM
Creatinine, Ser: 2.23 mg/dL — ABNORMAL HIGH (ref 0.44–1.00)
GFR calc non Af Amer: 21 mL/min — ABNORMAL LOW (ref >60)
GFR, EST AFRICAN AMERICAN: 24 mL/min — AB (ref >60)

## 2017-10-18 LAB — AMMONIA: Ammonia: 58 umol/L — ABNORMAL HIGH (ref 9–35)

## 2017-10-18 LAB — CBC
HEMATOCRIT: 31.9 % — AB (ref 36.0–46.0)
Hemoglobin: 10.2 g/dL — ABNORMAL LOW (ref 12.0–15.0)
MCH: 33.3 pg (ref 26.0–34.0)
MCHC: 32 g/dL (ref 30.0–36.0)
MCV: 104.2 fL — AB (ref 78.0–100.0)
PLATELETS: 100 10*3/uL — AB (ref 150–400)
RBC: 3.06 MIL/uL — ABNORMAL LOW (ref 3.87–5.11)
RDW: 18.9 % — AB (ref 11.5–15.5)
WBC: 3.7 10*3/uL — ABNORMAL LOW (ref 4.0–10.5)

## 2017-10-18 LAB — GLUCOSE, CAPILLARY
GLUCOSE-CAPILLARY: 99 mg/dL (ref 65–99)
GLUCOSE-CAPILLARY: 125 mg/dL — AB (ref 65–99)

## 2017-10-18 LAB — APTT: APTT: 31 s (ref 24–36)

## 2017-10-18 LAB — CORTISOL: CORTISOL PLASMA: 90.8 ug/dL

## 2017-10-18 LAB — MAGNESIUM: Magnesium: 2 mg/dL (ref 1.7–2.4)

## 2017-10-18 LAB — MRSA PCR SCREENING: MRSA BY PCR: NEGATIVE

## 2017-10-18 LAB — CBG MONITORING, ED: Glucose-Capillary: 100 mg/dL — ABNORMAL HIGH (ref 65–99)

## 2017-10-18 MED ORDER — METRONIDAZOLE IN NACL 5-0.79 MG/ML-% IV SOLN
500.0000 mg | Freq: Once | INTRAVENOUS | Status: AC
  Administered 2017-10-18: 500 mg via INTRAVENOUS
  Filled 2017-10-18: qty 100

## 2017-10-18 MED ORDER — ASPIRIN 81 MG PO CHEW
81.0000 mg | CHEWABLE_TABLET | Freq: Every day | ORAL | Status: DC
  Administered 2017-10-19 – 2017-10-26 (×5): 81 mg via ORAL
  Filled 2017-10-18 (×6): qty 1

## 2017-10-18 MED ORDER — SODIUM CHLORIDE 0.9 % IV BOLUS (SEPSIS)
1000.0000 mL | Freq: Once | INTRAVENOUS | Status: AC
  Administered 2017-10-18: 1000 mL via INTRAVENOUS

## 2017-10-18 MED ORDER — VANCOMYCIN HCL 10 G IV SOLR
1500.0000 mg | INTRAVENOUS | Status: DC
  Filled 2017-10-18 (×2): qty 1500

## 2017-10-18 MED ORDER — VANCOMYCIN HCL 10 G IV SOLR
1250.0000 mg | INTRAVENOUS | Status: DC
  Administered 2017-10-19: 1250 mg via INTRAVENOUS
  Filled 2017-10-18: qty 1250

## 2017-10-18 MED ORDER — NOREPINEPHRINE BITARTRATE 1 MG/ML IV SOLN
2.0000 ug/min | INTRAVENOUS | Status: DC
  Administered 2017-10-18: 2 ug/min via INTRAVENOUS
  Administered 2017-10-18: 10 ug/min via INTRAVENOUS
  Filled 2017-10-18 (×2): qty 4

## 2017-10-18 MED ORDER — RIFAXIMIN 550 MG PO TABS
550.0000 mg | ORAL_TABLET | Freq: Two times a day (BID) | ORAL | Status: DC
  Administered 2017-10-18 – 2017-10-26 (×11): 550 mg via ORAL
  Filled 2017-10-18 (×16): qty 1

## 2017-10-18 MED ORDER — HEPARIN SODIUM (PORCINE) 5000 UNIT/ML IJ SOLN
5000.0000 [IU] | Freq: Three times a day (TID) | INTRAMUSCULAR | Status: DC
  Administered 2017-10-18 – 2017-10-27 (×27): 5000 [IU] via SUBCUTANEOUS
  Filled 2017-10-18 (×30): qty 1

## 2017-10-18 MED ORDER — VANCOMYCIN HCL IN DEXTROSE 1-5 GM/200ML-% IV SOLN
1000.0000 mg | Freq: Once | INTRAVENOUS | Status: AC
  Administered 2017-10-18: 1000 mg via INTRAVENOUS
  Filled 2017-10-18: qty 200

## 2017-10-18 MED ORDER — HYDROCORTISONE NA SUCCINATE PF 100 MG IJ SOLR
50.0000 mg | Freq: Four times a day (QID) | INTRAMUSCULAR | Status: DC
  Administered 2017-10-18 – 2017-10-21 (×12): 50 mg via INTRAVENOUS
  Filled 2017-10-18 (×3): qty 1
  Filled 2017-10-18: qty 2
  Filled 2017-10-18 (×6): qty 1
  Filled 2017-10-18: qty 2
  Filled 2017-10-18 (×2): qty 1

## 2017-10-18 MED ORDER — SODIUM CHLORIDE 0.9 % IV BOLUS (SEPSIS)
500.0000 mL | Freq: Once | INTRAVENOUS | Status: AC
  Administered 2017-10-18: 500 mL via INTRAVENOUS

## 2017-10-18 MED ORDER — LEVOTHYROXINE SODIUM 125 MCG PO TABS
125.0000 ug | ORAL_TABLET | Freq: Every day | ORAL | Status: DC
  Administered 2017-10-19: 125 ug via ORAL
  Filled 2017-10-18 (×2): qty 1

## 2017-10-18 MED ORDER — SODIUM BICARBONATE 8.4 % IV SOLN
INTRAVENOUS | Status: DC
  Administered 2017-10-18 – 2017-10-19 (×2): via INTRAVENOUS
  Filled 2017-10-18 (×2): qty 150

## 2017-10-18 MED ORDER — SODIUM BICARBONATE 8.4 % IV SOLN
INTRAVENOUS | Status: DC
  Filled 2017-10-18: qty 1000

## 2017-10-18 MED ORDER — CALCIUM CARBONATE ANTACID 500 MG PO CHEW
1.0000 | CHEWABLE_TABLET | Freq: Two times a day (BID) | ORAL | Status: DC
  Administered 2017-10-18 – 2017-10-26 (×9): 200 mg via ORAL
  Filled 2017-10-18 (×15): qty 1

## 2017-10-18 MED ORDER — PANTOPRAZOLE SODIUM 40 MG PO TBEC
40.0000 mg | DELAYED_RELEASE_TABLET | Freq: Every day | ORAL | Status: DC
  Administered 2017-10-19: 40 mg via ORAL
  Filled 2017-10-18: qty 1

## 2017-10-18 MED ORDER — SODIUM CHLORIDE 0.9 % IV SOLN
1.0000 g | Freq: Two times a day (BID) | INTRAVENOUS | Status: DC
  Administered 2017-10-18 – 2017-10-26 (×16): 1 g via INTRAVENOUS
  Filled 2017-10-18 (×18): qty 1

## 2017-10-18 MED ORDER — METRONIDAZOLE IN NACL 5-0.79 MG/ML-% IV SOLN
500.0000 mg | Freq: Three times a day (TID) | INTRAVENOUS | Status: DC
  Administered 2017-10-18: 500 mg via INTRAVENOUS
  Filled 2017-10-18 (×2): qty 100

## 2017-10-18 MED ORDER — INSULIN ASPART 100 UNIT/ML ~~LOC~~ SOLN
0.0000 [IU] | Freq: Three times a day (TID) | SUBCUTANEOUS | Status: DC
  Administered 2017-10-18: 2 [IU] via SUBCUTANEOUS
  Administered 2017-10-19 (×3): 3 [IU] via SUBCUTANEOUS
  Administered 2017-10-20 – 2017-10-21 (×3): 2 [IU] via SUBCUTANEOUS
  Administered 2017-10-22: 5 [IU] via SUBCUTANEOUS
  Administered 2017-10-23 – 2017-10-26 (×11): 3 [IU] via SUBCUTANEOUS
  Administered 2017-10-26: 5 [IU] via SUBCUTANEOUS
  Administered 2017-10-27 (×2): 3 [IU] via SUBCUTANEOUS

## 2017-10-18 MED ORDER — DEXTROSE 5 % IV SOLN
2.0000 g | Freq: Once | INTRAVENOUS | Status: AC
  Administered 2017-10-18: 2 g via INTRAVENOUS
  Filled 2017-10-18: qty 2

## 2017-10-18 MED ORDER — DEXTROSE 5 % IV SOLN
1.0000 g | Freq: Three times a day (TID) | INTRAVENOUS | Status: DC
  Administered 2017-10-18: 1 g via INTRAVENOUS
  Filled 2017-10-18 (×8): qty 1

## 2017-10-18 MED ORDER — METHYLPREDNISOLONE SODIUM SUCC 125 MG IJ SOLR
125.0000 mg | Freq: Once | INTRAMUSCULAR | Status: AC
  Administered 2017-10-18: 125 mg via INTRAVENOUS
  Filled 2017-10-18: qty 2

## 2017-10-18 MED ORDER — LACTULOSE 10 GM/15ML PO SOLN
30.0000 g | Freq: Two times a day (BID) | ORAL | Status: DC
  Administered 2017-10-18 – 2017-10-23 (×9): 30 g via ORAL
  Filled 2017-10-18 (×16): qty 45

## 2017-10-18 MED ORDER — NOREPINEPHRINE BITARTRATE 1 MG/ML IV SOLN
10.0000 ug | Freq: Once | INTRAVENOUS | Status: DC
  Filled 2017-10-18: qty 0.01

## 2017-10-18 MED ORDER — DEXTROSE 5 % IV SOLN
0.0000 ug/min | INTRAVENOUS | Status: DC
  Administered 2017-10-18: 2 ug/min via INTRAVENOUS
  Administered 2017-10-20: 6 ug/min via INTRAVENOUS
  Filled 2017-10-18 (×2): qty 16

## 2017-10-18 MED ORDER — ASPIRIN 81 MG PO CHEW
324.0000 mg | CHEWABLE_TABLET | ORAL | Status: AC
  Administered 2017-10-18: 324 mg via ORAL
  Filled 2017-10-18: qty 4

## 2017-10-18 MED ORDER — ASPIRIN 300 MG RE SUPP: 300.0000 mg | RECTAL | Status: AC

## 2017-10-18 NOTE — Progress Notes (Signed)
ANTIBIOTIC CONSULT NOTE - INITIAL  Pharmacy Consult for aztreonam, flagyl and vancomycin Indication: sepsis  Allergies  Allergen Reactions  . Amoxicillin Other (See Comments)    Unknown  . Codeine Other (See Comments)    Unknown  . Decongest-Aid [Pseudoephedrine] Other (See Comments)    Unknown  . Decongestant [Oxymetazoline] Other (See Comments)    Unknown  . Metformin And Related Other (See Comments)    Unkown  . Tricor [Fenofibrate] Other (See Comments)    Unknown    Patient Measurements: Height: 5\' 7"  (170.2 cm) Weight: 255 lb (115.7 kg) IBW/kg (Calculated) : 61.6   Vital Signs: Temp: 93.9 F (34.4 C) (12/08 0800) BP: 72/44 (12/08 0800) Pulse Rate: 63 (12/08 0800) Intake/Output from previous day: 12/07 0701 - 12/08 0700 In: 3850 [IV Piggyback:3850] Out: 15 [Urine:15] Intake/Output from this shift: Total I/O In: -  Out: 22 [Urine:22]  Labs: Recent Labs    10/18/17 0357 10/18/17 0646  WBC 3.2* 2.5*  HGB 11.9* 9.3*  PLT 112* 83*  CREATININE 1.92* 1.81*   Estimated Creatinine Clearance: 38 mL/min (A) (by C-G formula based on SCr of 1.81 mg/dL (H)). No results for input(s): VANCOTROUGH, VANCOPEAK, VANCORANDOM, GENTTROUGH, GENTPEAK, GENTRANDOM, TOBRATROUGH, TOBRAPEAK, TOBRARND, AMIKACINPEAK, AMIKACINTROU, AMIKACIN in the last 72 hours.   Microbiology: Recent Results (from the past 720 hour(s))  Blood culture (routine x 2)     Status: None   Collection Time: 10/08/17  8:42 AM  Result Value Ref Range Status   Specimen Description BLOOD LEFT ARM  Final   Special Requests   Final    BOTTLES DRAWN AEROBIC AND ANAEROBIC Blood Culture adequate volume   Culture NO GROWTH 5 DAYS  Final   Report Status 10/13/2017 FINAL  Final  Blood culture (routine x 2)     Status: None   Collection Time: 10/08/17 10:05 AM  Result Value Ref Range Status   Specimen Description BLOOD RIGHT ARM  Final   Special Requests   Final    BOTTLES DRAWN AEROBIC AND ANAEROBIC Blood  Culture adequate volume   Culture NO GROWTH 5 DAYS  Final   Report Status 10/13/2017 FINAL  Final  Blood Culture (routine x 2)     Status: None (Preliminary result)   Collection Time: 10/18/17  4:00 AM  Result Value Ref Range Status   Specimen Description BLOOD RIGHT ARM  Final   Special Requests   Final    BOTTLES DRAWN AEROBIC ONLY Blood Culture adequate volume   Culture PENDING  Incomplete   Report Status PENDING  Incomplete  Blood Culture (routine x 2)     Status: None (Preliminary result)   Collection Time: 10/18/17  4:22 AM  Result Value Ref Range Status   Specimen Description BLOOD RIGHT ARM  Final   Special Requests   Final    BOTTLES DRAWN AEROBIC ONLY Blood Culture adequate volume   Culture PENDING  Incomplete   Report Status PENDING  Incomplete    Medical History: Past Medical History:  Diagnosis Date  . Anxiety   . CHF (congestive heart failure) (HCC)   . Depression   . Hypertension     Medications:  See medication history Assessment: 71 yo lady with hypothermia, hypotension to start broad spectrum antibiotics for sepsis, cellulitis.  She had recent admission for same.  Initial antibiotic doses given in the ED.  Low UOP noted.  She is on levophed.  Goal of Therapy:  Vancomycin trough level 15-20 mcg/ml  Plan:  Aztreonam 1gm IV  q8 hours Flagyl 500 mg IV q8 hours Vancomycin 1500 mg IV q48 hours F/u renal function, cultures, and clinical course  Noel Henandez Poteet 10/18/2017,8:25 AM

## 2017-10-18 NOTE — Progress Notes (Signed)
Upon admission the patient presented with the following skin findings:  RLE 3x3cm wound top of foot RLE foot distal to great toe 2.5x1.5 fluid filled blister, dark in appearance RLE shin 1x0.5cm wound Redness on bilaterally to right and left upper and lower extremity  Right knee 1cm wound RLE anterior wound RLE lateral skin tears x 3  LLE 5 medial small wounds L 5th toe, 3 wounds L 4th toe, 1 open wound, 2 blisters L 3rd toe, 2 blisters, 1 open wound L 2nd toe, 1 open wound, 4 blisters L great toe, large blister LLE open blister proximal to knee 1.5x0.5cm LLE open blister on knee 1x0.25cm  Large bruise on left lower buttock   Skin tear on right lower abd  Bilater UE generalized bruising  RUE generalized bruising RUE skin tear 4x2cm RUE open blisters x 3 to FA on right side 2.5cm skin tear right hand side wrist RUE skin tear inferior FA 1.5x0.5cm

## 2017-10-18 NOTE — ED Provider Notes (Addendum)
[facility] EMERGENCY DEPARTMENT Provider Note   CSN: [id number] Arrival date & time: 10/18/17  0341     History   Chief Complaint Chief Complaint  Patient presents with  . Allergic Reaction    HPI Nancy Baldwin is a 71 y.o. female.  HPI  This is a 71 year old female with history of CHF, hypertension, cirrhosis, hypothyroidism who presents with leg pain.  Patient provides minimal history.  She states "ask my husband."  She reports pain all over.  She has notable rashes to the bilateral lower extremities.  I have reviewed her chart.  Recent admission for cellulitis and likely sepsis.  She was discharged home with a course of doxycycline.  I discussed with the patient's husband recent course of events.  He reports that she has been taking her medications as prescribed.  She started to complain on Thursday of lower extremity pain and he noted some rash over the left lower extremity which was new.  She has a known open sore over the dorsum of her right foot.  No noted fevers at home.  He is able to tell me that her legs are fairly edematous and she gained 40 pounds in the hospital.  She was placed back on her diuretic.  Patient is able to tell me she is without chest pain.  She does report intermittent shortness of breath.  Not necessarily with lying flat.  Level 5 caveat for acuity of condition  Past Medical History:  Diagnosis Date  . Anxiety   . CHF (congestive heart failure) (HCC)   . Depression   . Hypertension     Patient Active Problem List   Diagnosis Date Noted  . Septic shock (HCC) 10/18/2017  . Sepsis (HCC) 10/08/2017  . Elevated lactic acid level 10/08/2017  . Cellulitis of right foot 10/08/2017  . Falls frequently 10/08/2017  . Altered mental status, unspecified 10/08/2017  . Dehydration 10/08/2017  . AKI (acute kidney injury) (HCC) 10/08/2017  . Hypertension 10/08/2017  . Type 2 diabetes mellitus with other specified complication (HCC) 06/18/2017  .  Thrombocytopenia (HCC) 06/18/2017  . Multiple closed fractures of ribs of left side   . T12 compression fracture (HCC) 06/13/2017  . 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 Intractable pain 06/12/2017  . Diastolic CHF (HCC) 02/07/2014  . Cirrhosis (HCC) 02/07/2014  . Hypothyroidism 02/07/2014  . Anxiety state, unspecified 02/07/2014  . Depression 02/07/2014    Past Surgical History:  Procedure Laterality Date  . ABDOMINAL HYSTERECTOMY    . TONSILLECTOMY AND ADENOIDECTOMY      OB History    Gravida Para Term Preterm AB Living   3             SAB TAB Ectopic Multiple Live Births                   Home Medications    Prior to Admission medications   Medication Sig Start Date End Date Taking? Authorizing Provider  Ascorbic Acid (VITAMIN C) 100 MG tablet Take 100 mg by mouth daily.   Yes [provider]  aspirin 81 MG tablet Take 81 mg by mouth daily.   Yes [provider]  calcium carbonate (TUMS) 500 MG chewable tablet Chew 1 tablet by mouth 2 (two) times daily.   Yes [provider]  doxycycline (VIBRA-TABS) 100 MG tablet Take 1 tablet (100 mg total) by mouth every 12 (twelve) hours for 7 days. 10/11/17 10/18/17 Yes  Philip Aspen, Limmie Patricia, MD  furosemide (LASIX) 20 MG tablet Take 1 tablet (20 mg total) by mouth every other day. 10/14/17  Yes Rolland Porter, MD  insulin glargine (LANTUS) 100 unit/mL SOPN Inject 0.15 mLs (15 Units total) into the skin daily before breakfast. 06/19/17  Yes Elliot Cousin, MD  lactulose (CHRONULAC) 10 GM/15ML solution Take 45 mLs (30 g total) by mouth 2 (two) times daily. 10/14/17  Yes Rolland Porter, MD  levothyroxine (SYNTHROID, LEVOTHROID) 125 MCG tablet Take 1 tablet (125 mcg total) by mouth daily before breakfast. 06/20/17  Yes Elliot Cousin, MD  Multiple Vitamin (MULTIVITAMIN WITH MINERALS) TABS tablet Take 1 tablet by mouth daily.   Yes [provider]  pantoprazole  (PROTONIX) 40 MG tablet Take 1 tablet (40 mg total) by mouth daily. 06/20/17  Yes Elliot Cousin, MD  potassium chloride (K-DUR) 10 MEQ tablet Take 1 tablet (10 mEq total) by mouth 2 (two) times daily. 06/19/17  Yes Elliot Cousin, MD  pravastatin (PRAVACHOL) 10 MG tablet Take by mouth daily.  06/11/17  Yes [provider]  propranolol (INDERAL) 20 MG tablet 20 mg daily.  06/11/17  Yes [provider]  rifaximin (XIFAXAN) 550 MG TABS tablet Take 1 tablet (550 mg total) by mouth 2 (two) times daily. 06/19/17  Yes Elliot Cousin, MD  spironolactone (ALDACTONE) 25 MG tablet Take 12.5 mg by mouth 2 (two) times daily.   Yes [provider]  venlafaxine XR (EFFEXOR-XR) 150 MG 24 hr capsule Take 150 mg by mouth 2 (two) times daily.    Yes [provider]  Vitamin D, Cholecalciferol, 1000 units TABS Take 1 tablet by mouth daily.   Yes [provider]    Family History Family History  Problem Relation Age of Onset  . Lung cancer Mother   . Heart disease Father   . Kidney disease Father   . Heart attack Father     Social History Social History   Tobacco Use  . Smoking status: Former Games developer  . Smokeless tobacco: Never Used  Substance Use Topics  . Alcohol use: No  . Drug use: No     Allergies   Amoxicillin; Codeine; Decongest-aid [pseudoephedrine]; Decongestant [oxymetazoline]; Metformin and related; and Tricor [fenofibrate]   Review of Systems Review of Systems  Unable to perform ROS: Acuity of condition  Constitutional: Negative for fever.  Respiratory: Positive for shortness of breath.   Cardiovascular: Positive for leg swelling. Negative for chest pain.  Gastrointestinal: Negative for abdominal pain, nausea and vomiting.  Skin: Positive for color change and rash.     Physical Exam Updated Vital Signs BP (!) 80/49   Pulse 63   Temp (!) 93.6 F (34.2 C)   Resp (!) 23   Ht 5\' 7"  (1.702 m)   Wt 115.7 kg (255 lb)   SpO2 100%   BMI 39.94  kg/m   Physical Exam  Constitutional: She is oriented to person, place, and time.  Ill-appearing, morbidly obese  HENT:  Head: Normocephalic and atraumatic.  Cardiovascular: Regular rhythm and normal heart sounds.  Pulmonary/Chest: Effort normal. No respiratory distress.  Severely limited secondary to body habitus, no significant respiratory distress  Abdominal: Soft. She exhibits no distension.  Musculoskeletal: She exhibits edema.  Bilateral lower extremity pitting edema to the thigh, weeping, bullae noted bilaterally, there is a bloody bullae over the medial aspect of the right great toe   Neurological: She is alert and oriented to person, place, and time.  Skin: Skin is  warm and dry.  Ulceration over the dorsum of the right foot with clean margins, no significant erythema Bilateral lower extremities with petechial nonblanching vasculitic appearing rash Ecchymosis over all 4 extremities  Psychiatric: She has a normal mood and affect.  Nursing note and vitals reviewed.        ED Treatments / Results  Labs (all labs ordered are listed, but only abnormal results are displayed) Labs Reviewed  COMPREHENSIVE METABOLIC PANEL - Abnormal; Notable for the following components:      Result Value   Potassium 5.2 (*)    Glucose, Bld 116 (*)    BUN 50 (*)    Creatinine, Ser 1.92 (*)    Total Protein 5.8 (*)    Albumin 2.0 (*)    AST 141 (*)    ALT 95 (*)    Alkaline Phosphatase 419 (*)    Total Bilirubin 5.1 (*)    GFR calc non Af Amer 25 (*)    GFR calc Af Amer 29 (*)    All other components within normal limits  CBC WITH DIFFERENTIAL/PLATELET - Abnormal; Notable for the following components:   WBC 3.2 (*)    RBC 3.56 (*)    Hemoglobin 11.9 (*)    MCV 108.4 (*)    RDW 18.7 (*)    Platelets 112 (*)    Lymphs Abs 0.5 (*)    All other components within normal limits  URINALYSIS, ROUTINE W REFLEX MICROSCOPIC - Abnormal; Notable for the following components:   Color, Urine  AMBER (*)    APPearance HAZY (*)    All other components within normal limits  TSH - Abnormal; Notable for the following components:   TSH 37.194 (*)    All other components within normal limits  AMMONIA - Abnormal; Notable for the following components:   Ammonia 58 (*)    All other components within normal limits  COMPREHENSIVE METABOLIC PANEL - Abnormal; Notable for the following components:   CO2 18 (*)    Glucose, Bld 123 (*)    BUN 50 (*)    Creatinine, Ser 1.81 (*)    Calcium 7.9 (*)    Total Protein 4.3 (*)    Albumin 1.5 (*)    AST 103 (*)    ALT 69 (*)    Alkaline Phosphatase 314 (*)    Total Bilirubin 4.0 (*)    GFR calc non Af Amer 27 (*)    GFR calc Af Amer 32 (*)    All other components within normal limits  CBC WITH DIFFERENTIAL/PLATELET - Abnormal; Notable for the following components:   WBC 2.5 (*)    RBC 2.76 (*)    Hemoglobin 9.3 (*)    HCT 29.7 (*)    MCV 107.6 (*)    RDW 18.9 (*)    Platelets 83 (*)    Lymphs Abs 0.6 (*)    All other components within normal limits  PROTIME-INR - Abnormal; Notable for the following components:   Prothrombin Time 23.4 (*)    All other components within normal limits  BRAIN NATRIURETIC PEPTIDE - Abnormal; Notable for the following components:   B Natriuretic Peptide 1,302.0 (*)    All other components within normal limits  CULTURE, BLOOD (ROUTINE X 2)  CULTURE, BLOOD (ROUTINE X 2)  APTT  T4, FREE  I-STAT CG4 LACTIC ACID, ED  CBG MONITORING, ED  I-STAT CG4 LACTIC ACID, ED  I-STAT TROPONIN, ED    EKG  EKG Interpretation None  ED ECG REPORT   Date: 10/18/2017  Rate: 86  Rhythm: normal sinus rhythm  QRS Axis: normal  Intervals: normal  ST/T Wave abnormalities: normal  Conduction Disutrbances:none  Narrative Interpretation:   Old EKG Reviewed: none available Low voltage  I have personally reviewed the EKG tracing and agree with the computerized printout as noted.   Radiology Dg Chest Portable 1  View  Result Date: 10/18/2017 CLINICAL DATA:  Sepsis.  Central line placement. EXAM: PORTABLE CHEST 1 VIEW COMPARISON:  Same day FINDINGS: Right internal jugular central line tip is at the SVC RA junction. No pneumothorax. Left lower lobe atelectasis persists. IMPRESSION: Right internal jugular central line tip well positioned at the SVC RA junction. Electronically Signed   By: Paulina FusiMark  Shogry M.D.   On: 10/18/2017 07:26   Dg Chest Port 1 View  Result Date: 10/18/2017 CLINICAL DATA:  Altered consciousness.  Sepsis. EXAM: PORTABLE CHEST 1 VIEW COMPARISON:  October 14, 2017 FINDINGS: Stable cardiomegaly. The hila and mediastinum are normal. Mild opacity in left base is favored to represent atelectasis. Pulmonary venous congestion without overt edema. No acute abnormalities otherwise seen. IMPRESSION: Cardiomegaly and pulmonary venous congestion. Opacity in left base is favored to represent atelectasis, not significantly changed. Electronically Signed   By: Gerome Samavid  Williams III M.D   On: 10/18/2017 05:11    Procedures .Central Line Date/Time: 10/18/2017 7:25 AM Performed by: Shon BatonHorton, Mesa Janus F, MD Authorized by: Shon BatonHorton, Tayvon Culley F, MD   Consent:    Consent obtained:  Verbal and emergent situation   Consent given by:  Patient   Risks discussed:  Incorrect placement, arterial puncture and bleeding Pre-procedure details:    Hand hygiene: Hand hygiene performed prior to insertion     Sterile barrier technique: All elements of maximal sterile technique followed     Skin preparation:  2% chlorhexidine   Skin preparation agent: Skin preparation agent completely dried prior to procedure   Anesthesia (see MAR for exact dosages):    Anesthesia method:  Local infiltration   Local anesthetic:  Lidocaine 1% w/o epi Procedure details:    Location:  R internal jugular   Patient position:  Reverse Trendelenburg   Procedural supplies:  Triple lumen   Catheter size:  7 Fr   Landmarks identified: yes      Ultrasound guidance: yes     Number of attempts:  1   Successful placement: yes   Post-procedure details:    Post-procedure:  Dressing applied   Assessment:  Blood return through all ports and no pneumothorax on x-ray   Patient tolerance of procedure:  Tolerated well, no immediate complications ARTERIAL LINE Date/Time: 10/18/2017 7:26 AM Performed by: Shon BatonHorton, Frazer Rainville F, MD Authorized by: Shon BatonHorton, Stuart Guillen F, MD   Consent:    Consent obtained:  Verbal and emergent situation   Consent given by:  Patient   Risks discussed:  Bleeding and infection Indications:    Indications: hemodynamic monitoring   Pre-procedure details:    Skin preparation:  Alcohol and Betadine Anesthesia (see MAR for exact dosages):    Anesthesia method:  Local infiltration Procedure details:    Location: left brachial.   Allen's test performed: no     Needle gauge:  20 G   Placement technique:  Seldinger   Number of attempts:  3   Transducer: waveform confirmed   Post-procedure details:    Post-procedure:  Secured with tape and sterile dressing applied   CMS:  Normal   Patient tolerance of procedure:  Tolerated well,  no immediate complications Comments:     Attempts made bilaterally with radial arteries, unsuccessful.  Given patient's body habitus, brachial was the next best position.   (including critical care time)   CRITICAL CARE Performed by: Shon BatonHORTON, Deleah Tison F   Total critical care time: 75 minutes  Critical care time was exclusive of separately billable procedures and treating other patients.  Critical care was necessary to treat or prevent imminent or life-threatening deterioration.  Critical care was time spent personally by me on the following activities: development of treatment plan with patient and/or surrogate as well as nursing, discussions with consultants, evaluation of patient's response to treatment, examination of patient, obtaining history from patient or surrogate, ordering and  performing treatments and interventions, ordering and review of laboratory studies, ordering and review of radiographic studies, pulse oximetry and re-evaluation of patient's condition.  Medications Ordered in ED Medications  norepinephrine (LEVOPHED) 4 mg in dextrose 5 % 250 mL (0.016 mg/mL) infusion (3 mcg/min Intravenous Rate/Dose Change 10/18/17 0725)  methylPREDNISolone sodium succinate (SOLU-MEDROL) 125 mg/2 mL injection 125 mg (not administered)  sodium chloride 0.9 % bolus 1,000 mL (0 mLs Intravenous Stopped 10/18/17 0631)    And  sodium chloride 0.9 % bolus 1,000 mL (0 mLs Intravenous Stopped 10/18/17 0556)    And  sodium chloride 0.9 % bolus 1,000 mL (0 mLs Intravenous Stopped 10/18/17 0520)    And  sodium chloride 0.9 % bolus 500 mL (0 mLs Intravenous Stopped 10/18/17 0555)  aztreonam (AZACTAM) 2 g in dextrose 5 % 50 mL IVPB (0 g Intravenous Stopped 10/18/17 0532)  metroNIDAZOLE (FLAGYL) IVPB 500 mg (0 mg Intravenous Stopped 10/18/17 0547)  vancomycin (VANCOCIN) IVPB 1000 mg/200 mL premix (0 mg Intravenous Stopped 10/18/17 0555)     Initial Impression / Assessment and Plan / ED Course  I have reviewed the triage vital signs and the nursing notes.  Pertinent labs & imaging results that were available during my care of the patient were reviewed by me and considered in my medical decision making (see chart for details).     Patient presents with pain all over specifically lower extremities with significant edema and rash.  She is ill-appearing.  Initial vital signs notable for temperature of 93.2.  Blood pressures in the 70s.  Given recent history of cellulitis and sepsis, sepsis workup initiated.  Lactate 5.8.  Because of this, full sepsis workup including 30 cc/kg was given.  I have confirmed with the patient and her husband that she is DNR/DNI.  At this time she does appear significantly volume overloaded but her respiratory status is stable.  We will monitor closely.  Unfortunately, it  was difficult to obtain an accurate blood pressure given patient's body habitus.  Arterial line was attempted prior to initiation of vasopressors.  Multiple attempts were made.  Finally a arterial line was achieved in the left brachial.  Confirms persistently blood pressures after 30 cc/kg.  Central line was placed and patient was placed on levophed.  During placement of central catheter, patient did appear to be volume up.  I reviewed her last echocardiogram.  She does have history of diastolic heart failure with an EF of 60-65%.  She could also have decompensated heart failure.  Given her history of hypothyroidism, TSH was sent.  It is significantly elevated.  Could be an element of thyroid crisis.  T4 pending.  She was given a dose of Solu-Medrol for any potential adrenal insufficiency.  Running diagnosis at this time is septic shock; however,  it appears to be somewhat multifactorial.  She was covered broadly with antibiotics.  Of note, during line placement, patient did seem to ooze significantly.  Repeat lab work shows persistent thrombocytopenia likely worsened from dilution from fluids.  INR is 2.11.  This is all likely related to liver failure.  I discussed this patient at length with Dr. Darrick Penna.  While she is DNR, she and her husband do want any other measures taken to help her.  I feel this is reasonable given that she is not fully differentiated at this point.  Received a total of 30 cc/kg fluid and is currently on levo fed with blood pressure slowly trending upwards.  She also received stress dose steroids and broad-spectrum antibiotics.  8:03 AM Reevaluated prior to transfer.  She states she feels a little bit better.  Blood pressure is still low with 80 systolic.  Requested increase of levophed.  Of note, patient has had minimal urine output.  We will continue to monitor closely.  ICU bed pending.  Final Clinical Impressions(s) / ED Diagnoses   Final diagnoses:  Septic shock (HCC)    Hepatic cirrhosis, unspecified hepatic cirrhosis type, unspecified whether ascites present [facility])  Lower extremity edema  Cellulitis of lower extremity, unspecified laterality  Hypothyroidism, unspecified type    ED Discharge Orders    None       Wayland Baik, Mayer Masker, MD 10/18/17 5284    Shon Baton, MD 10/18/17 1324    Shon Baton, MD 10/18/17 2303

## 2017-10-18 NOTE — ED Triage Notes (Signed)
Pt arrived by EMS from home. Called out for allergic reaction. Pt has been on antibiotics, per discharge paperwork dates 10/11/17 pt admitted for sepsis. Per EMS looks like pt has skin infection & her skin is weeping.

## 2017-10-18 NOTE — Progress Notes (Addendum)
Pharmacy Antibiotic Note  Nancy Baldwin is a 71 y.o. female admitted on 10/18/2017 with sepsis and cellulitis.    Plan: Dc aztreonam flagyl Add meropenem 1 g q12 Adjust vanc to 1250 q24h Monitor renal fx cx vt prn  Height: 5\' 7"  (170.2 cm) Weight: 255 lb (115.7 kg) IBW/kg (Calculated) : 61.6  Temp (24hrs), Avg:93.1 F (33.9 C), Min:92.7 F (33.7 C), Max:93.9 F (34.4 C)  Recent Labs  Lab 10/14/17 0539 10/14/17 0540 10/14/17 0654 10/18/17 0357 10/18/17 0646  WBC  --   --  10.4 3.2* 2.5*  CREATININE  --  1.48*  --  1.92* 1.81*  LATICACIDVEN 1.81  --   --   --   --     Estimated Creatinine Clearance: 38 mL/min (A) (by C-G formula based on SCr of 1.81 mg/dL (H)).    Allergies  Allergen Reactions  . Amoxicillin Other (See Comments)    Unknown  . Codeine Other (See Comments)    Unknown  . Decongest-Aid [Pseudoephedrine] Other (See Comments)    Unknown  . Decongestant [Oxymetazoline] Other (See Comments)    Unknown  . Metformin And Related Other (See Comments)    Unkown  . Tricor [Fenofibrate] Other (See Comments)    Unknown   [name], PharmD, BCPS, BCCCP Clinical Pharmacist Clinical phone for 10/18/2017 from 7a-3:30p: [phone] If after 3:30p, please call main pharmacy at: [phone] 10/18/2017 10:32 AM

## 2017-10-18 NOTE — H&P (Signed)
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  Of note no organisms were specified during that hospitalization.  She was discharged to home under the care of her husband, apparently was doing okay, eating, getting about in the house using a walker, had baseline intermittent confusion.  Over the past 1-2 days she began to have worsening confusion and worsening discoloration, redness, and heat over the lower extremities.  Her husband called EMS given concerns about her worsening lower extremity wounds.  In the emergency room she was noted to be confused,She had too numerous to count lesions lesions/skin tears/ulcerations all over her body the worst over her lower extremities.  She was afebrile, but noted to be hypotensive with blood pressure in the 80s she was started on norepinephrine and transferred to [facility] with working diagnosis of sepsis.   PAST MEDICAL HISTORY :  She  has a past medical history of Anxiety, CHF (congestive heart failure) (HCC), Depression, and Hypertension.  PAST SURGICAL HISTORY: She  has a past surgical history that includes Abdominal hysterectomy and Tonsillectomy and adenoidectomy.  Allergies  Allergen Reactions  . Amoxicillin Other (See Comments)    Unknown  . Codeine Other (See Comments)    Unknown  . Decongest-Aid [Pseudoephedrine] Other (See Comments)    Unknown  . Decongestant [Oxymetazoline] Other (See Comments)    Unknown    . Metformin And Related Other (See Comments)    Unkown  . Tricor [Fenofibrate] Other (See Comments)    Unknown    No current facility-administered medications on file prior to encounter.    Current Outpatient Medications on File Prior to Encounter  Medication Sig  . Ascorbic Acid (VITAMIN C) 100 MG tablet Take 100 mg by mouth daily.  Marland Kitchen. aspirin 81 MG tablet Take 81 mg by mouth daily.  . calcium carbonate (TUMS) 500 MG chewable tablet Chew 1 tablet by mouth 2 (two) times daily.  Marland Kitchen. doxycycline (VIBRA-TABS) 100 MG tablet Take 1 tablet (100 mg total) by mouth every 12 (twelve) hours for 7 days.  . furosemide (LASIX) 20 MG tablet Take 1 tablet (20 mg total) by mouth every other day.  . insulin glargine (LANTUS) 100 unit/mL SOPN Inject 0.15 mLs (15 Units total) into the skin daily before breakfast.  . lactulose (CHRONULAC) 10 GM/15ML solution Take 45 mLs (30 g total) by mouth 2 (two) times daily.  Marland Kitchen. levothyroxine (SYNTHROID, LEVOTHROID) 125 MCG tablet Take 1 tablet (125 mcg total) by mouth daily before breakfast.  . Multiple Vitamin (MULTIVITAMIN WITH MINERALS) TABS tablet Take 1 tablet by mouth daily.  . pantoprazole (PROTONIX) 40 MG tablet Take 1 tablet (40 mg total) by mouth daily.  . potassium chloride (K-DUR) 10 MEQ tablet Take 1 tablet (10 mEq total) by mouth 2 (two) times daily.  . pravastatin (PRAVACHOL) 10 MG tablet Take by mouth daily.   . propranolol (INDERAL) 20 MG tablet 20 mg daily.   . rifaximin (XIFAXAN) 550 MG TABS tablet Take 1 tablet (550 mg total) by mouth 2 (two)  times daily.  Marland Kitchen spironolactone (ALDACTONE) 25 MG tablet Take 12.5 mg by mouth 2 (two) times daily.  Marland Kitchen venlafaxine XR (EFFEXOR-XR) 150 MG 24 hr capsule Take 150 mg by mouth 2 (two) times daily.   . Vitamin D, Cholecalciferol, 1000 units TABS Take 1 tablet by mouth daily.    FAMILY HISTORY:  Her indicated that the status of her mother is unknown. She indicated that the status of her father is unknown.   SOCIAL  HISTORY: She  reports that she has quit smoking. she has never used smokeless tobacco. She reports that she does not drink alcohol or use drugs.  REVIEW OF SYSTEMS:   Unable  SUBJECTIVE:  Currently no distress  VITAL SIGNS: BP (!) 72/44   Pulse 68   Temp (!) 94.3 F (34.6 C) (Oral)   Resp 20   Ht 5\' 7"  (1.702 m)   Wt 255 lb (115.7 kg)   SpO2 100%   BMI 39.94 kg/m    HEMODYNAMICS:    VENTILATOR SETTINGS:    INTAKE / OUTPUT: I/O last 3 completed shifts: In: 3850 [IV Piggyback:3850] Out: 15 [Urine:15]  PHYSICAL EXAMINATION: General: Is a chronically ill-appearing 71 year old female currently sitting up in bed in no acute distress she is pleasantly confused.  She tells me she does not usually shop here. Neuro: Awake, confused, moves all extremities as no focal weakness HEENT: Normocephalic atraumatic no jugular venous distention she has a right internal jugular vein catheter in place Cardiovascular: Regular rate and rhythm Lungs: Bibasilar crackles, no accessory muscle use breathing comfortably on nasal cannula Abdomen: Obese, positive bowel sounds no organomegaly Musculoskeletal: Equal strength Skin: Too numerous to count ulcerations over bilateral lower extremities and upper extremities.  Her skin is weeping with generalized anasarca.  The lower extremities are pictured below  Right arm   Right foot  Right foot   Left foot   Left foot   Left foot  LABS:  BMET Recent Labs  Lab 10/14/17 0540 10/18/17 0357 10/18/17 0646  NA 137 137 136  K 5.0 5.2* 5.0  CL 110 106 110  CO2 23 22 18*  BUN 41* 50* 50*  CREATININE 1.48* 1.92* 1.81*  GLUCOSE 129* 116* 123*    Electrolytes Recent Labs  Lab 10/14/17 0540 10/18/17 0357 10/18/17 0646  CALCIUM 8.8* 9.2 7.9*    CBC Recent Labs  Lab 10/14/17 0654 10/18/17 0357 10/18/17 0646  WBC 10.4 3.2* 2.5*  HGB 12.1 11.9* 9.3*  HCT 39.1 38.6 29.7*  PLT 134* 112* 83*    Coag's Recent Labs  Lab  10/18/17 0646  APTT 31  INR 2.11    Sepsis Markers Recent Labs  Lab 10/14/17 0539  LATICACIDVEN 1.81    ABG No results for input(s): PHART, PCO2ART, PO2ART in the last 168 hours.  Liver Enzymes Recent Labs  Lab 10/14/17 0540 10/18/17 0357 10/18/17 0646  AST 138* 141* 103*  ALT 91* 95* 69*  ALKPHOS 402* 419* 314*  BILITOT 3.4* 5.1* 4.0*  ALBUMIN 1.9* 2.0* 1.5*    Cardiac Enzymes No results for input(s): TROPONINI, PROBNP in the last 168 hours.  Glucose Recent Labs  Lab 10/11/17 1637 10/18/17 0917 10/18/17 1210  GLUCAP 174* 100* 99    Imaging Dg Chest Portable 1 View  Result Date: 10/18/2017 CLINICAL DATA:  Sepsis.  Central line placement. EXAM: PORTABLE CHEST 1 VIEW COMPARISON:  Same day FINDINGS: Right internal jugular central line tip is at the SVC RA junction. No pneumothorax. Left lower lobe atelectasis  persists. IMPRESSION: Right internal jugular central line tip well positioned at the SVC RA junction. Electronically Signed   By: Paulina FusiMark  Shogry M.D.   On: 10/18/2017 07:26   Dg Chest Port 1 View  Result Date: 10/18/2017 CLINICAL DATA:  Altered consciousness.  Sepsis. EXAM: PORTABLE CHEST 1 VIEW COMPARISON:  October 14, 2017 FINDINGS: Stable cardiomegaly. The hila and mediastinum are normal. Mild opacity in left base is favored to represent atelectasis. Pulmonary venous congestion without overt edema. No acute abnormalities otherwise seen. IMPRESSION: Cardiomegaly and pulmonary venous congestion. Opacity in left base is favored to represent atelectasis, not significantly changed. Electronically Signed   By: Gerome Samavid  Williams III M.D   On: 10/18/2017 05:11    STUDIES:   CULTURES: Blood cultures x2 12/8>>>  ANTIBIOTICS: Vancomycin 12/8>>>> Meropenem 12/8>>>  SIGNIFICANT EVENTS:   LINES/TUBES:   DISCUSSION:   ASSESSMENT / PLAN:  Severe sepsis septic shock in the setting of what appears to be diffuse cellulitis involving at least the both lower  extremities possibly more extensive. -Also consider urinary tract source Plan DO NOT RESUSCITATE Levophed for mean arterial pressure greater than 65 Transduce CVP: Fluid bolus for goal CVP 8-12 Check cortisol Stress dose steroids Pan culture Day #1 vancomycin and meropenem Hold home antihypertensives and diuretics Will have wound ostomy nurse evaluate and make recommendations on multiple skin lesions  History of diastolic heart failure Plan Holding diuretics and antihypertensives Continue telemetry monitoring  Acute encephalopathy superimposed on probably what has intermittent hepatic encephalopathy Plan Treat infection Continue lactulose and rifaximin mean  Acute on chronic renal failure; CRI stage II-III at baseline; with baseline creatinine at around 1.4 Non-anion gap metabolic acidosis Borderline hyperkalemia Plan Maintenance IV fluids with D5 water with 3 Amps of bicarbonate at 75 cc an hour  transduce CVP Holding diuretics and antihypertensives  Macrocytic anemia without evidence of bleeding Plan Send anemia panel Subcu heparin Transfuse per protocol  Thrombocytopenia and coagulopathy in setting of cirrhosis Plan Replace vitamin K Continuing subcu heparin but may need to discontinue if platelets continue to drop  Severe protein calorie malnutrition superimposed on 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  Hypothyroidism Plan Continue Synthroid supplementation  Diabetes Plan Sliding scale insulin   FAMILY  - Updates: Spoke to husband via phone  - Inter-disciplinary family meet or Palliative Care meeting due by: 12/15  DVT prophylaxis: [address] heparin  SUP: PPI  Diet: regular  Activity: br Disposition : ICU  Simonne MartinetPeter E Babcock ACNP-BC [facility] Pulmonary/Critical Care Pager # [phone] OR # [phone] if no answer  10/18/2017, 2:53 PM  Attending Note:  71 year old female with extensive PMH presenting with leg  cellulitis and septic shock.  On exam, patient is very disheveled with very erythematous legs and clear lungs.  I reviewed CXR myself, central line is in good position.  Discussed with PCCM-NP.  Will admit to the ICU for pressor support.  F/u on cultures.  Broad spectrum abx.  Follow CVP.  BMET in AM ordered.  Electrolytes to be replaced.  PCCM will continue to follow.  The patient is critically ill with multiple organ systems failure and requires high complexity decision making for assessment and support, frequent evaluation and titration of therapies, application of advanced monitoring technologies and extensive interpretation of multiple databases.   Critical Care Time devoted to patient care services described in this note is  35  Minutes. This time reflects time of care of this signee Dr Koren BoundWesam Sahasra Belue.  This critical care time does not reflect procedure time, or teaching time or supervisory time of PA/NP/Med student/Med Resident etc but could involve care discussion time.  Rush Farmer, M.D. [facility] Pulmonary/Critical Care Medicine. Pager: [phone]. After hours pager: [phone].

## 2017-10-18 NOTE — Consult Note (Signed)
WOC Nurse wound consult note Reason for Consult: Numersous areas of full and partial thickness tissue loss to bilateral LEs and to right arm (ruptured and intact bullae).  Excessive weeping. Intertriginous dermatitis beneath right side of panus. Moisture management issues in the inframammary and subpannicular areas. Wound type: infectious, moisture Pressure Injury POA: NA Measurements: Note:  Measurements taken by Bedside RN M. Dykes and recorded in progress notes at 2:55pm. They follow below.  My additions/notes/edits are in BOLD  RLE 3x3cm x 0.1cm wound top of foot full thickness RLE foot distal to great toe 2.5x1.5 blood filled blister (intact) RLE shin 1x0.5cm x 0.1cm wound Partial thickness Redness on bilaterally to right and left upper and lower extremity  Right knee 1cm round wound x 0.1cm partial thickness RLE lateral skin tears x 3  Linear, Category 1  LLE 5 medial small wounds partial thickness -L 5th toe, 3 wounds -L 4th toe, 1 open wound, 2 blisters -L 3rd toe, 2 blisters, 1 open wound -L 2nd toe, 1 open wound, 4 blisters -L great toe, large blister LLE open blister proximal to knee 1.5x0.5cm x 0.1cm LLE open blister on knee 1x0.25cm x 0.1cm  Large bruise on left lower buttock (related to fall)  Skin tear on right lower abd (intertriginous dermatitis) (partial thickness)   Wound bed:red, moist, free of necrotic tissue except for intact blood filled blister at RGT Drainage (amount, consistency, odor) Moderate to large amount of weeping f serous fluid from right arm and left LE Periwound: erythematous, edematous Dressing procedure/placement/frequency: Patient is on a therapeutic mattress with low air loss feature.  I will provide bilateral pressure redistribution heel boots (Prevalon).  Moisture management/microclimate intervention to the inframammary and subpannicular areas will be with with our house antimicrobial textile ([employer]).  Topical care for the bilateral  LEs and right arm will be with xeroform gauze (astringent with antimicrobial properties), ABDs, Kerlix and paper tape.  Twice daily changes are recommended.  WOC nursing team will not follow, but will remain available to this patient, the nursing and medical teams.  Please re-consult if needed. Thanks, Ladona MowLaurie Melenie Minniear, MSN, RN, GNP, Hans EdenCWOCN, CWON-AP, FAAN  Pager# [phone]

## 2017-10-18 NOTE — ED Notes (Signed)
Dr Wilkie Ayehorton in to assess pt.  Verbal order given to increase levophed drip to 5 mcg/min or 18.8 ml/hr via pump.

## 2017-10-18 NOTE — ED Notes (Signed)
Skin tears noted to both arms and legs (multiple areas). Redness, weeping and blisters noted to areas and legs.  Ulcers noted to right top foot, left knee and left foot. Husband reports large bruise to buttock, not able to assess at this time.  Skin color is dusky.  Mucous membranes dry.

## 2017-10-18 NOTE — ED Notes (Signed)
[facility] in to transport pt.

## 2017-10-19 DIAGNOSIS — L0390 Cellulitis, unspecified: Secondary | ICD-10-CM

## 2017-10-19 DIAGNOSIS — E875 Hyperkalemia: Secondary | ICD-10-CM

## 2017-10-19 LAB — GLUCOSE, CAPILLARY
GLUCOSE-CAPILLARY: 161 mg/dL — AB (ref 65–99)
Glucose-Capillary: 164 mg/dL — ABNORMAL HIGH (ref 65–99)
Glucose-Capillary: 165 mg/dL — ABNORMAL HIGH (ref 65–99)
Glucose-Capillary: 170 mg/dL — ABNORMAL HIGH (ref 65–99)
Glucose-Capillary: 180 mg/dL — ABNORMAL HIGH (ref 65–99)

## 2017-10-19 LAB — BASIC METABOLIC PANEL
ANION GAP: 12 (ref 5–15)
BUN: 51 mg/dL — ABNORMAL HIGH (ref 6–20)
CALCIUM: 8.5 mg/dL — AB (ref 8.9–10.3)
CO2: 21 mmol/L — ABNORMAL LOW (ref 22–32)
Chloride: 104 mmol/L (ref 101–111)
Creatinine, Ser: 2.53 mg/dL — ABNORMAL HIGH (ref 0.44–1.00)
GFR, EST AFRICAN AMERICAN: 21 mL/min — AB (ref >60)
GFR, EST NON AFRICAN AMERICAN: 18 mL/min — AB (ref >60)
GLUCOSE: 206 mg/dL — AB (ref 65–99)
Potassium: 5.6 mmol/L — ABNORMAL HIGH (ref 3.5–5.1)
Sodium: 137 mmol/L (ref 135–145)

## 2017-10-19 LAB — CBC
HEMATOCRIT: 32.7 % — AB (ref 36.0–46.0)
HEMOGLOBIN: 10.3 g/dL — AB (ref 12.0–15.0)
MCH: 33.6 pg (ref 26.0–34.0)
MCHC: 31.5 g/dL (ref 30.0–36.0)
MCV: 106.5 fL — AB (ref 78.0–100.0)
Platelets: 114 10*3/uL — ABNORMAL LOW (ref 150–400)
RBC: 3.07 MIL/uL — AB (ref 3.87–5.11)
RDW: 19.6 % — ABNORMAL HIGH (ref 11.5–15.5)
WBC: 9.6 10*3/uL (ref 4.0–10.5)

## 2017-10-19 LAB — MAGNESIUM: Magnesium: 2.1 mg/dL (ref 1.7–2.4)

## 2017-10-19 LAB — PHOSPHORUS: PHOSPHORUS: 6.2 mg/dL — AB (ref 2.5–4.6)

## 2017-10-19 MED ORDER — VANCOMYCIN HCL 10 G IV SOLR
1750.0000 mg | INTRAVENOUS | Status: DC
  Administered 2017-10-21: 1750 mg via INTRAVENOUS
  Filled 2017-10-19: qty 1750

## 2017-10-19 MED ORDER — SODIUM CHLORIDE 0.9 % IV SOLN
INTRAVENOUS | Status: DC | PRN
  Administered 2017-10-19: 21:00:00 via INTRA_ARTERIAL

## 2017-10-19 MED ORDER — FUROSEMIDE 10 MG/ML IJ SOLN
40.0000 mg | Freq: Three times a day (TID) | INTRAMUSCULAR | Status: AC
  Administered 2017-10-19 (×2): 40 mg via INTRAVENOUS
  Filled 2017-10-19 (×3): qty 4

## 2017-10-19 MED ORDER — HALOPERIDOL 0.5 MG PO TABS
0.5000 mg | ORAL_TABLET | Freq: Every day | ORAL | Status: DC
  Administered 2017-10-19 – 2017-10-23 (×5): 0.5 mg via ORAL
  Filled 2017-10-19 (×6): qty 1

## 2017-10-19 MED ORDER — ACETAMINOPHEN 325 MG PO TABS
650.0000 mg | ORAL_TABLET | Freq: Once | ORAL | Status: AC
  Administered 2017-10-19: 650 mg via ORAL
  Filled 2017-10-19: qty 2

## 2017-10-19 NOTE — Progress Notes (Signed)
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  Of note no organisms were specified during that hospitalization.  She was discharged to home under the care of her husband, apparently was doing okay, eating, getting about in the house using a walker, had baseline intermittent confusion.  Over the past 1-2 days she began to have worsening confusion and worsening discoloration, redness, and heat over the lower extremities.  Her husband called EMS given concerns about her worsening lower extremity wounds.  In the emergency room she was noted to be confused,She had too numerous to count lesions lesions/skin tears/ulcerations all over her body the worst over her lower extremities.  She was afebrile, but noted to be hypotensive with blood pressure in the 80s she was started on norepinephrine and transferred to Con with working diagnosis of sepsis.   SUBJECTIVE:  No events overnight, no new complaints  VITAL SIGNS: BP (!) 128/94   Pulse 74   Temp 97.8 F (36.6 C) (Axillary)   Resp 16   Ht 5\' 7"  (1.702 m)   Wt 124.9 kg (275 lb 5.7 oz)   SpO2 98%   BMI 43.13 kg/m   HEMODYNAMICS: CVP:  [7 mmHg] 7 mmHg  VENTILATOR SETTINGS:    INTAKE / OUTPUT: I/O last 3 completed shifts: In: 6161.1 [P.O.:320; I.V.:1391.1; IV Piggyback:4450] Out: 342 [Urine:342]  PHYSICAL EXAMINATION: General: Chronically ill appearing female, NAD. Neuro: Confused but awake, moving  all ext to command HEENT: [place]/AT, PERRL, EOM-I and MMM Cardiovascular: RRR, Nl S1/S2, -M/R/G. Lungs: Bibasilar crackles Abdomen: Soft, NT, ND and +BS Musculoskeletal: Diffuse edema Skin: Too numerous to count ulcerations over bilateral lower extremities and upper extremities.  Her skin is weeping with generalized anasarca.  The lower extremities are pictured below   Right arm   Right foot  Right foot   Left foot   Left foot   Left foot  LABS:  BMET Recent Labs  Lab 10/18/17 0357 10/18/17 0646 10/18/17 1653 10/19/17 0455  NA 137 136  --  137  K 5.2* 5.0  --  5.6*  CL 106 110  --  104  CO2 22 18*  --  21*  BUN 50* 50*  --  51*  CREATININE 1.92* 1.81* 2.23* 2.53*  GLUCOSE 116* 123*  --  206*   Electrolytes Recent Labs  Lab 10/18/17 0357 10/18/17 0646 10/18/17 1653 10/19/17 0455  CALCIUM 9.2 7.9*  --  8.5*  MG  --   --  2.0 2.1  PHOS  --   --  5.5* 6.2*    CBC Recent Labs  Lab 10/18/17 0646 10/18/17 1653 10/19/17 0455  WBC 2.5* 3.7* 9.6  HGB 9.3* 10.2* 10.3*  HCT 29.7* 31.9* 32.7*  PLT 83* 100* 114*    Coag's Recent Labs  Lab 10/18/17 0646  APTT 31  INR 2.11    Sepsis Markers Recent Labs  Lab 10/14/17 0539 10/18/17 1653  LATICACIDVEN 1.81  --   PROCALCITON  --  2.67    ABG No results  for input(s): PHART, PCO2ART, PO2ART in the last 168 hours.  Liver Enzymes Recent Labs  Lab 10/14/17 0540 10/18/17 0357 10/18/17 0646  AST 138* 141* 103*  ALT 91* 95* 69*  ALKPHOS 402* 419* 314*  BILITOT 3.4* 5.1* 4.0*  ALBUMIN 1.9* 2.0* 1.5*    Cardiac Enzymes No results for input(s): TROPONINI, PROBNP in the last 168 hours.  Glucose Recent Labs  Lab 10/18/17 0917 10/18/17 1210 10/18/17 1802 10/19/17 0012 10/19/17 0905  GLUCAP 100* 99 125* 170* 164*    Imaging No results found.  STUDIES:   CULTURES: Blood cultures x2 12/8>>>  ANTIBIOTICS: Vancomycin 12/8>>>> Meropenem 12/8>>>  SIGNIFICANT EVENTS:   LINES/TUBES: R IJ  TLC 12/8>>>  DISCUSSION: 71 year old female with extensive PMH who presents with cellulitis and associated septic shock.  ASSESSMENT / PLAN:  Severe sepsis septic shock in the setting of what appears to be diffuse cellulitis involving at least the both lower extremities possibly more extensive. -Also consider urinary tract source Plan Levophed for mean arterial pressure greater than 65 CVP Stress dose steroids Pan culture Day #2 vancomycin and meropenem Wound care following  History of diastolic heart failure Plan Lasix to address anasarca and hyperkalemia Continue telemetry monitoring  Acute encephalopathy superimposed on probably what has intermittent hepatic encephalopathy Plan Treat infection Continue lactulose and rifaximin mean  Acute on chronic renal failure; CRI stage II-III at baseline; with baseline creatinine at around 1.4 Non-anion gap metabolic acidosis Borderline hyperkalemia Plan D/C bicarb drip Transduce CVP Lasix 40 mg IV q8 x2 doses  Macrocytic anemia without evidence of bleeding Plan Send anemia panel Subcu heparin Transfuse per protocol  Thrombocytopenia and coagulopathy in setting of cirrhosis Plan Vitamin K Continuing subcu heparin but may need to discontinue if platelets continue to drop  Severe protein calorie malnutrition superimposed on 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  Hypothyroidism Plan Continue Synthroid supplementation  Diabetes Plan Sliding scale insulin  FAMILY  - Updates: No family bedside to update  - Inter-disciplinary family meet or Palliative Care meeting due by: 12/15  The patient is critically ill with multiple organ systems failure and requires high complexity decision making for assessment and support, frequent evaluation and titration of therapies, application of advanced monitoring technologies and extensive interpretation of multiple databases.   Critical Care  Time devoted to patient care services described in this note is  35  Minutes. This time reflects time of care of this signee Dr Koren BoundWesam Dahlton Hinde. This critical care time does not reflect procedure time, or teaching time or supervisory time of PA/NP/Med student/Med Resident etc but could involve care discussion time.  Alyson ReedyWesam G. Jolane Bankhead, M.D. [facility] Pulmonary/Critical Care Medicine. Pager: [phone]. After hours pager: [phone].

## 2017-10-19 NOTE — Progress Notes (Signed)
eLink Physician-Brief Progress Note Patient Name: Nancy GentileKacie S Sellick DOB: [birth year] MRN: [id number]   Date of Service  10/19/2017  HPI/Events of Note  Multiple issues: 1. Patient c/o leg pain - Allergy to Codeine, therefore, can't give narcotics. Creatinine = 2.53, therefore, can't give NSAID. AST = 103 and ALT = 69, therefore, would like to avoid Tylenol and 2. Request to restart home Haldol Q HS dose. STC interval = 420 milliseconds.   eICU Interventions  Will order: 1. Tylenol 625 mg PO X 1 now.  2. Haldol 0.5 mg PO Q HS. 3. Monitor QTc interval every evening prior to administering Haldol.      Intervention Category Intermediate Interventions: Pain - evaluation and management  Maccoy Haubner Eugene 10/19/2017, 8:12 PM

## 2017-10-19 NOTE — Progress Notes (Signed)
Pharmacy Antibiotic Note  Celesta GentileKacie S Tortorella is a 71 y.o. female admitted on 10/18/2017 with sepsis and cellulitis.    Plan: Continue meropenem 1 g q12h Adjust vanc to 1750 q48 (next dose 12/11) Monitor renal fx cx vt prn Consider level prior to next dose vanc if SCr continues to rise  Height: 5\' 7"  (170.2 cm) Weight: 275 lb 5.7 oz (124.9 kg) IBW/kg (Calculated) : 61.6  Temp (24hrs), Avg:95.5 F (35.3 C), Min:94.3 F (34.6 C), Max:97.8 F (36.6 C)  Recent Labs  Lab 10/14/17 0539 10/14/17 0540 10/14/17 0654 10/18/17 0357 10/18/17 0646 10/18/17 1653 10/19/17 0455  WBC  --   --  10.4 3.2* 2.5* 3.7* 9.6  CREATININE  --  1.48*  --  1.92* 1.81* 2.23* 2.53*  LATICACIDVEN 1.81  --   --   --   --   --   --     Estimated Creatinine Clearance: 28.4 mL/min (A) (by C-G formula based on SCr of 2.53 mg/dL (H)).    Allergies  Allergen Reactions  . Amoxicillin Other (See Comments)    Unknown  . Codeine Other (See Comments)    Unknown  . Decongest-Aid [Pseudoephedrine] Other (See Comments)    Unknown  . Decongestant [Oxymetazoline] Other (See Comments)    Unknown  . Metformin And Related Other (See Comments)    Unkown  . Tricor [Fenofibrate] Other (See Comments)    Unknown   Isaac BlissMichael Kerri-Anne Haeberle, PharmD, BCPS, BCCCP Clinical Pharmacist Clinical phone for 10/19/2017 from 7a-3:30p: [phone] If after 3:30p, please call main pharmacy at: [phone] 10/19/2017 10:20 AM

## 2017-10-20 DIAGNOSIS — K7460 Unspecified cirrhosis of liver: Secondary | ICD-10-CM

## 2017-10-20 DIAGNOSIS — Z66 Do not resuscitate: Secondary | ICD-10-CM

## 2017-10-20 DIAGNOSIS — Z515 Encounter for palliative care: Secondary | ICD-10-CM

## 2017-10-20 DIAGNOSIS — L03119 Cellulitis of unspecified part of limb: Secondary | ICD-10-CM

## 2017-10-20 DIAGNOSIS — K7581 Nonalcoholic steatohepatitis (NASH): Secondary | ICD-10-CM

## 2017-10-20 LAB — I-STAT TROPONIN, ED
TROPONIN I, POC: 0.01 ng/mL (ref 0.00–0.08)
TROPONIN I, POC: 0.01 ng/mL (ref 0.00–0.08)

## 2017-10-20 LAB — PHOSPHORUS: PHOSPHORUS: 5.9 mg/dL — AB (ref 2.5–4.6)

## 2017-10-20 LAB — CBC
HEMATOCRIT: 31.3 % — AB (ref 36.0–46.0)
HEMOGLOBIN: 9.9 g/dL — AB (ref 12.0–15.0)
MCH: 33.2 pg (ref 26.0–34.0)
MCHC: 31.6 g/dL (ref 30.0–36.0)
MCV: 105 fL — AB (ref 78.0–100.0)
Platelets: 107 10*3/uL — ABNORMAL LOW (ref 150–400)
RBC: 2.98 MIL/uL — AB (ref 3.87–5.11)
RDW: 19.6 % — AB (ref 11.5–15.5)
WBC: 15.6 10*3/uL — AB (ref 4.0–10.5)

## 2017-10-20 LAB — BASIC METABOLIC PANEL
ANION GAP: 9 (ref 5–15)
BUN: 64 mg/dL — ABNORMAL HIGH (ref 6–20)
CALCIUM: 8.2 mg/dL — AB (ref 8.9–10.3)
CHLORIDE: 105 mmol/L (ref 101–111)
CO2: 23 mmol/L (ref 22–32)
Creatinine, Ser: 2.65 mg/dL — ABNORMAL HIGH (ref 0.44–1.00)
GFR calc non Af Amer: 17 mL/min — ABNORMAL LOW (ref >60)
GFR, EST AFRICAN AMERICAN: 20 mL/min — AB (ref >60)
Glucose, Bld: 157 mg/dL — ABNORMAL HIGH (ref 65–99)
POTASSIUM: 4.8 mmol/L (ref 3.5–5.1)
Sodium: 137 mmol/L (ref 135–145)

## 2017-10-20 LAB — GLUCOSE, CAPILLARY
GLUCOSE-CAPILLARY: 105 mg/dL — AB (ref 65–99)
Glucose-Capillary: 142 mg/dL — ABNORMAL HIGH (ref 65–99)
Glucose-Capillary: 124 mg/dL — ABNORMAL HIGH (ref 65–99)
Glucose-Capillary: 114 mg/dL — ABNORMAL HIGH (ref 65–99)

## 2017-10-20 LAB — I-STAT CG4 LACTIC ACID, ED
LACTIC ACID, VENOUS: 4.76 mmol/L — AB (ref 0.5–1.9)
Lactic Acid, Venous: 5.83 mmol/L (ref 0.5–1.9)

## 2017-10-20 LAB — MAGNESIUM: Magnesium: 2.1 mg/dL (ref 1.7–2.4)

## 2017-10-20 LAB — CBG MONITORING, ED
GLUCOSE-CAPILLARY: 100 mg/dL — AB (ref 65–99)
Glucose-Capillary: 95 mg/dL (ref 65–99)

## 2017-10-20 MED ORDER — HALOPERIDOL LACTATE 5 MG/ML IJ SOLN
1.0000 mg | Freq: Once | INTRAMUSCULAR | Status: AC
  Administered 2017-10-20: 1 mg via INTRAVENOUS
  Filled 2017-10-20: qty 1

## 2017-10-20 MED ORDER — PANTOPRAZOLE SODIUM 40 MG IV SOLR
40.0000 mg | Freq: Every day | INTRAVENOUS | Status: DC
  Administered 2017-10-20 – 2017-10-24 (×5): 40 mg via INTRAVENOUS
  Filled 2017-10-20 (×5): qty 40

## 2017-10-20 MED ORDER — ORAL CARE MOUTH RINSE
15.0000 mL | Freq: Two times a day (BID) | OROMUCOSAL | Status: DC
Start: 2017-10-20 — End: 2017-10-27
  Administered 2017-10-20 – 2017-10-27 (×12): 15 mL via OROMUCOSAL

## 2017-10-20 MED ORDER — LEVOTHYROXINE SODIUM 100 MCG IV SOLR
62.5000 ug | Freq: Every day | INTRAVENOUS | Status: DC
  Administered 2017-10-20 – 2017-10-21 (×2): 62.5 ug via INTRAVENOUS
  Filled 2017-10-20 (×2): qty 5

## 2017-10-20 NOTE — Progress Notes (Signed)
Palliative Medicine RN Note: Consult order noted. Due to severe weather and resultant staffing issues, there may be a delay seeing this patient. If you require immediate recommendations, please call our office at [phone]  Arelie Kuzel G. Zaydon Kinser, RN, BSN, CHPN 10/20/2017 8:31 AM  Office [phone] 

## 2017-10-20 NOTE — Progress Notes (Signed)
eLink Physician-Brief Progress Note Patient Name: Nancy GentileKacie S Baldwin DOB: 25-Jan-1946 MRN: [id number]   Date of Service  10/20/2017  HPI/Events of Note  Agitation - Request for bilateral wrist restraints.   eICU Interventions  Will order bilateral soft wrist restraints.      Intervention Category Major Interventions: Delirium, psychosis, severe agitation - evaluation and management  Sommer,Steven Eugene 10/20/2017, 8:24 PM

## 2017-10-20 NOTE — Consult Note (Signed)
Consultation Note Date: 10/20/2017   Patient Name: Nancy Baldwin  DOB: [birth year]  MRN: [id number]  Age / Sex: 71 y.o., female  PCP: Ignatius SpeckingVyas, Dhruv B, MD Referring Physician: Alyson ReedyYacoub, Wesam G, MD  Reason for Consultation: Establishing goals of care and Psychosocial/spiritual support  HPI/Patient Profile: 71 y.o. 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     She was discharged to home under the care of her husband, apparently was doing okay, eating, getting about in the house using a walker, had baseline intermittent confusion.   She began to have worsening confusion and worsening discoloration, redness, and heat over the lower extremities.  Her husband called EMS given concerns about her worsening lower extremity wounds.    In the emergency room she was noted to be confused, She had too numerous to count lesions lesions/skin tears/ulcerations all over her body the worst over her lower extremities.    Her family she has had continued physical, functional, and cognitive decline over the past many months.  Multiple rehospitalizations over the last 6 months.  Discussed high risk of decompensation secondary to multiple comorbidities  Patient's  husband tells me is getting more difficult to care for her at home in the context of her multiple co morbidities.  Family face treatment option decisions,  advanced directive decisions and anticipatory care needs.      Clinical Assessment and Goals of Care:  This NP Lorinda CreedMary Deloyce Walthers reviewed medical records, received report from team, assessed the patient and then meet at the patient's bedside  along with her two sons/ Leonette Mostharles and Molly MaduroRobert  to discuss diagnosis, prognosis, GOC, EOL wishes disposition and options.  I then spoke to patient's husband by telephone to discuss the same.  She has 2 sons both live in [place] and had very little to do with the daily day-to-day care of the patient.    A detailed discussion was had today regarding advanced directives.  Concepts specific to code status, artifical feeding and hydration, continued IV antibiotics and rehospitalization was had.  The difference between a aggressive medical intervention path  and a palliative comfort care path for this patient at this time was had.  Values and goals of care important to patient and family were attempted to be elicited.  MOST form introduced  Concept of Hospice and Palliative Care were discussed  Questions and concerns addressed.   Family encouraged to call with questions or concerns.  PMT will continue to support holistically.    HCPOA- husband    SUMMARY OF RECOMMENDATIONS    Code Status/Advance Care Planning:  DNR-documented today both patient's husband and her sons verify that this is the patient's specific wish verbalized many years ago.  Palliative Prophylaxis:   Aspiration, Bowel Regimen, Delirium Protocol, Frequent Pain Assessment and Oral Care  Additional Recommendations (Limitations, Scope, Preferences):  Full Scope Treatment-husband verbalized an understanding of the overall complex medical situation and high risk for decompensation.  We discussed the importance of continued conversation regarding aggressiveness of medical interventions if the patient does not rebound back to her baseline.  Psycho-social/Spiritual:   Desire for further Chaplaincy support:no  Additional Recommendations: Education on Hospice  Prognosis:   Unable to determine-will depend on desire for life prolonging measures  Discharge Planning: To Be Determined      Primary Diagnoses: Present on  Admission: . Septic shock (HCC)   I have reviewed the medical record, interviewed the patient and family, and examined the patient. The following aspects are pertinent.  Past Medical History:  Diagnosis Date  . Anxiety   . CHF (congestive heart failure) (HCC)   . Depression   . Hypertension    Social History   Socioeconomic History  . Marital status: Married    Spouse name: None  . Number of children: None  . Years of education: None  . Highest education level: None  Social Needs  . Financial resource strain: None  . Food insecurity - worry: None  . Food insecurity - inability: None  . Transportation needs - medical: None  . Transportation needs - non-medical: None  Occupational History  . None  Tobacco Use  . Smoking status: Former Games developermoker  . Smokeless tobacco: Never Used  Substance and Sexual Activity  . Alcohol use: No  . Drug use: No  . Sexual activity: No  Other Topics Concern  . None  Social History Narrative  . None   Family History  Problem Relation Age of Onset  . Lung cancer Mother   . Heart disease Father   . Kidney disease Father   . Heart attack Father    Scheduled Meds: . aspirin  81 mg Oral Daily  . calcium carbonate  1 tablet Oral BID  . haloperidol  0.5 mg Oral QHS  . heparin  5,000 Units Subcutaneous Q8H  . hydrocortisone sod succinate (SOLU-CORTEF) inj  50 mg Intravenous Q6H  . insulin aspart  0-15 Units Subcutaneous TID WC  . lactulose  30 g Oral BID  . levothyroxine  125 mcg Oral QAC breakfast  . pantoprazole  40 mg Oral Daily  . rifaximin  550 mg Oral BID   Continuous Infusions: . sodium chloride 10 mL/hr at 10/20/17 0900  . meropenem (MERREM) IV Stopped (10/20/17 0541)  . norepinephrine (LEVOPHED) Adult infusion 4 mcg/min (10/20/17 0920)  . [START ON 10/21/2017] vancomycin     PRN Meds:.Place/Maintain arterial line **AND** sodium chloride Medications Prior to Admission:  Prior to Admission medications   Medication Sig Start  Date End Date Taking? Authorizing Provider  Ascorbic Acid (VITAMIN C) 100 MG tablet Take 100 mg by mouth daily.   Yes [provider]  aspirin 81 MG tablet Take 81 mg by mouth daily.   Yes [provider]  calcium carbonate (TUMS) 500 MG chewable tablet Chew 1 tablet by mouth 2 (two) times daily.   Yes [provider]  furosemide (LASIX) 20 MG tablet Take 1 tablet (20 mg total) by mouth every other day. 10/14/17  Yes Rolland PorterJames, Mark, MD  haloperidol (HALDOL) 0.5 MG tablet Take 0.5 mg by mouth at bedtime. 10/07/17  Yes [provider]  insulin glargine (LANTUS) 100 unit/mL SOPN Inject 0.15 mLs (15 Units total) into the skin daily before breakfast. 06/19/17  Yes Elliot CousinFisher, Denise, MD  lactulose (CHRONULAC) 10 GM/15ML solution  Take 45 mLs (30 g total) by mouth 2 (two) times daily. 10/14/17  Yes Rolland Porter, MD  levothyroxine (SYNTHROID, LEVOTHROID) 125 MCG tablet Take 1 tablet (125 mcg total) by mouth daily before breakfast. 06/20/17  Yes Elliot Cousin, MD  Multiple Vitamin (MULTIVITAMIN WITH MINERALS) TABS tablet Take 1 tablet by mouth daily.   Yes [provider]  pantoprazole (PROTONIX) 40 MG tablet Take 1 tablet (40 mg total) by mouth daily. 06/20/17  Yes Elliot Cousin, MD  potassium chloride (K-DUR) 10 MEQ tablet Take 1 tablet (10 mEq total) by mouth 2 (two) times daily. 06/19/17  Yes Elliot Cousin, MD  pravastatin (PRAVACHOL) 10 MG tablet Take by mouth daily.  06/11/17  Yes [provider]  propranolol (INDERAL) 20 MG tablet 20 mg daily.  06/11/17  Yes [provider]  rifaximin (XIFAXAN) 550 MG TABS tablet Take 1 tablet (550 mg total) by mouth 2 (two) times daily. 06/19/17  Yes Elliot Cousin, MD  spironolactone (ALDACTONE) 25 MG tablet Take 12.5 mg by mouth 2 (two) times daily.   Yes [provider]  venlafaxine XR (EFFEXOR-XR) 150 MG 24 hr capsule Take 150 mg by mouth 2 (two) times daily.    Yes [provider]  Vitamin D,  Cholecalciferol, 1000 units TABS Take 1 tablet by mouth daily.   Yes [provider]   Allergies  Allergen Reactions  . Amoxicillin Other (See Comments)    Unknown  . Codeine Other (See Comments)    Unknown  . Decongest-Aid [Pseudoephedrine] Other (See Comments)    Unknown  . Decongestant [Oxymetazoline] Other (See Comments)    Unknown  . Metformin And Related Other (See Comments)    Unkown  . Tricor [Fenofibrate] Other (See Comments)    Unknown   Review of Systems  Unable to perform ROS: Acuity of condition    Physical Exam  Constitutional: She appears well-developed. She appears lethargic. She appears ill.  Cardiovascular: Normal rate, regular rhythm and normal heart sounds.  Pulmonary/Chest: She has decreased breath sounds in the right lower field and the left lower field.  Neurological: She appears lethargic.  Skin: Skin is warm and dry.  Noted areas of skin breakdown per EMR    Vital Signs: BP (!) 95/50   Pulse 74   Temp (!) 96.2 F (35.7 C) (Axillary)   Resp 13   Ht 5\' 7"  (1.702 m)   Wt 125.4 kg (276 lb 7.3 oz)   SpO2 93%   BMI 43.30 kg/m  Pain Assessment: CPOT   Pain Score: 10-Worst pain ever   SpO2: SpO2: 93 % O2 Device:SpO2: 93 % O2 Flow Rate: .O2 Flow Rate (L/min): 4 L/min  IO: Intake/output summary:   Intake/Output Summary (Last 24 hours) at 10/20/2017 1005 Last data filed at 10/20/2017 0900 Gross per 24 hour  Intake 498.04 ml  Output 651 ml  Net -152.96 ml    LBM: Last BM Date: 10/20/17 Baseline Weight: Weight: 115.7 kg (255 lb) Most recent weight: Weight: 125.4 kg (276 lb 7.3 oz)     Palliative Assessment/Data: 30 % at best   Discussed with Dr Delton Coombes  Time In: 0945 Time Out: 1100 Time Total: 75 min Greater than 50%  of this time was spent counseling and coordinating care related to the above assessment and plan.  Signed by: Lorinda Creed, NP   Please contact Palliative Medicine Team phone at [phone] for questions and  concerns.  For individual provider: See Loretha Stapler

## 2017-10-20 NOTE — Progress Notes (Signed)
eLink Physician-Brief Progress Note Patient Name: Nancy GentileKacie S Baldwin DOB: Apr 04, 1946 MRN: [id number]   Date of Service  10/20/2017  HPI/Events of Note  Agitation - Patient picking at dressings and IVs.  eICU Interventions  Will order: 1. Bilateral soft wrist restraints. 2. Haldol 1 mg IV now.      Intervention Category Minor Interventions: Agitation / anxiety - evaluation and management  Sommer,Steven Eugene 10/20/2017, 5:06 AM

## 2017-10-20 NOTE — Progress Notes (Signed)
Patient with increased agitation,  pulling off equipment, pulling at lines and taking gown and blankets x3 in the past 45 minutes. MD notified, orders for restraints placed, will continue to closely monitor.

## 2017-10-20 NOTE — Progress Notes (Signed)
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  Of note no organisms were specified during that hospitalization.  She was discharged to home under the care of her husband, apparently was doing okay, eating, getting about in the house using a walker, had baseline intermittent confusion.  Over the past 1-2 days she began to have worsening confusion and worsening discoloration, redness, and heat over the lower extremities.  Her husband called EMS given concerns about her worsening lower extremity wounds.  In the emergency room she was noted to be confused,She had too numerous to count lesions lesions/skin tears/ulcerations all over her body the worst over her lower extremities.  She was afebrile, but noted to be hypotensive with blood pressure in the 80s she was started on norepinephrine and transferred to [facility] with working diagnosis of sepsis.   SUBJECTIVE:  No new events reported Remains on norepinephrine 5 Received some Haldol overnight for agitation  VITAL SIGNS: BP (!) 117/56   Pulse 76   Temp (!) 96.2 F (35.7 C) (Axillary)   Resp 13   Ht 5\' 7"  (1.702 m)   Wt 125.4 kg (276 lb 7.3 oz)   SpO2 93%   BMI 43.30 kg/m   HEMODYNAMICS:    VENTILATOR SETTINGS:    INTAKE / OUTPUT: I/O last 3 completed shifts: In: 2487.5 [P.O.:360; I.V.:1677.5; IV Piggyback:450] Out: 786 [Urine:785; Stool:1]  PHYSICAL EXAMINATION: General: Chronically ill, obese  woman, somewhat sedated Neuro: Opens eyes to voice, attempts to speak garbled, unable to answer questions appropriately, moves her upper extremities to command HEENT: Oropharynx clear, pupils equal, no lesions Cardiovascular: Regular, borderline tachycardic, no murmurs heard, diffuse 1+ edema Lungs: Distant and decreased at both bases, no wheezing Abdomen: Soft, nontender, positive bowel sounds Musculoskeletal: No deformities Skin: She has bruising, skin tears with some areas of ulceration on both upper and lower extremities.  Pictures noted in prior notes.  LABS:  BMET Recent Labs  Lab 10/18/17 0646 10/18/17 1653 10/19/17 0455 10/20/17 0521  NA 136  --  137 137  K 5.0  --  5.6* 4.8  CL 110  --  104 105  CO2 18*  --  21* 23  BUN 50*  --  51* 64*  CREATININE 1.81* 2.23* 2.53* 2.65*  GLUCOSE 123*  --  206* 157*   Electrolytes Recent Labs  Lab 10/18/17 0646 10/18/17 1653 10/19/17 0455 10/20/17 0521  CALCIUM 7.9*  --  8.5* 8.2*  MG  --  2.0 2.1 2.1  PHOS  --  5.5* 6.2* 5.9*    CBC Recent Labs  Lab 10/18/17 1653 10/19/17 0455 10/20/17 0521  WBC 3.7* 9.6 15.6*  HGB 10.2* 10.3* 9.9*  HCT 31.9* 32.7* 31.3*  PLT 100* 114* 107*    Coag's Recent Labs  Lab 10/18/17 0646  APTT 31  INR 2.11    Sepsis Markers Recent Labs  Lab 10/14/17 0539 10/18/17 1653  LATICACIDVEN 1.81  --   PROCALCITON  --  2.67    ABG No results for input(s):  PHART, PCO2ART, PO2ART in the last 168 hours.  Liver Enzymes Recent Labs  Lab 10/14/17 0540 10/18/17 0357 10/18/17 0646  AST 138* 141* 103*  ALT 91* 95* 69*  ALKPHOS 402* 419* 314*  BILITOT 3.4* 5.1* 4.0*  ALBUMIN 1.9* 2.0* 1.5*    Cardiac Enzymes No results for input(s): TROPONINI, PROBNP in the last 168 hours.  Glucose Recent Labs  Lab 10/18/17 1802 10/19/17 0012 10/19/17 0905 10/19/17 1146 10/19/17 1457 10/19/17 2315  GLUCAP 125* 170* 164* 180* 165* 161*    Imaging No results found.  STUDIES:    CULTURES: Blood cultures x2 12/8>>>  ANTIBIOTICS: Vancomycin 12/8>>>> Meropenem 12/8>>>  SIGNIFICANT EVENTS:  LINES/TUBES: R IJ TLC 12/8>>>  DISCUSSION: 71 year old female with extensive PMH who presents with cellulitis and associated septic shock.  ASSESSMENT / PLAN:  Severe sepsis septic shock in the setting of what appears to be diffuse cellulitis involving at least the both lower extremities possibly more extensive. -Also consider urinary tract source >> UA negative 12/8 Plan Continue to wean norepinephrine, currently at 5, goal MAP greater than 65 CVP goal 10-12 Hydrocortisone stress dose steroids as ordered Antibiotics  >> continue meropenem, continue vancomycin for now although likely narrow 12/11 if culture data continues to be negative Appreciate wound care assistance Hold further diuresis 12/10  History of diastolic heart failure Plan Continue telemetry monitoring Hold diuresis 12/10  Acute encephalopathy superimposed on probably what has intermittent hepatic encephalopathy Plan Anticipate improvement as we reverse underlying metabolic and infectious contributors Continue lactulose and rifaximin Minimize sedating medications as able Haldol as needed, try to minimize  Acute on chronic renal failure; CRI stage II-III at baseline; with baseline creatinine at around 1.4 Non-anion gap metabolic acidosis Borderline hyperkalemia Plan Hold diuretics 12/10 given evolving acute renal insufficiency Bicarbonate drip discontinued Follow volume status, urine output, BMP Replace electro lites as indicated  Macrocytic anemia without evidence of bleeding Plan Send anemia panel Subcu heparin Transfuse per protocol  Thrombocytopenia and coagulopathy in setting of cirrhosis Plan Vitamin K Continuing subcu heparin but may need to discontinue if platelets continue to drop  Severe protein calorie malnutrition superimposed on cirrhosis in the setting of  NASH Plan Continue p.o. diet Follow LFT, INR  Hypothyroidism Plan Continue Synthroid as ordered  Diabetes Plan Continue sliding scale insulin as ordered  FAMILY  - Updates: Sons updated at bedside 12/10.  Agree with also involving palliative care  - Inter-disciplinary family meet or Palliative Care meeting due by: 12/15  Independent critical care time 32 minutes  Levy Pupaobert Angelys Yetman, MD, PhD 10/20/2017, 9:29 AM [facility] [phone] or if no answer [phone]

## 2017-10-21 ENCOUNTER — Other Ambulatory Visit: Payer: Self-pay

## 2017-10-21 DIAGNOSIS — N179 Acute kidney failure, unspecified: Secondary | ICD-10-CM

## 2017-10-21 LAB — CBC
HCT: 32.6 % — ABNORMAL LOW (ref 36.0–46.0)
HEMOGLOBIN: 10.5 g/dL — AB (ref 12.0–15.0)
MCH: 33.3 pg (ref 26.0–34.0)
MCHC: 32.2 g/dL (ref 30.0–36.0)
MCV: 103.5 fL — ABNORMAL HIGH (ref 78.0–100.0)
PLATELETS: 111 10*3/uL — AB (ref 150–400)
RBC: 3.15 MIL/uL — AB (ref 3.87–5.11)
RDW: 19.4 % — ABNORMAL HIGH (ref 11.5–15.5)
WBC: 20.9 10*3/uL — AB (ref 4.0–10.5)

## 2017-10-21 LAB — PROTIME-INR
INR: 2.36
PROTHROMBIN TIME: 25.7 s — AB (ref 11.4–15.2)

## 2017-10-21 LAB — GLUCOSE, CAPILLARY
GLUCOSE-CAPILLARY: 100 mg/dL — AB (ref 65–99)
GLUCOSE-CAPILLARY: 112 mg/dL — AB (ref 65–99)
Glucose-Capillary: 147 mg/dL — ABNORMAL HIGH (ref 65–99)
Glucose-Capillary: 184 mg/dL — ABNORMAL HIGH (ref 65–99)

## 2017-10-21 LAB — PROCALCITONIN: Procalcitonin: 2.72 ng/mL

## 2017-10-21 LAB — COMPREHENSIVE METABOLIC PANEL
ALBUMIN: 1.6 g/dL — AB (ref 3.5–5.0)
ALK PHOS: 244 U/L — AB (ref 38–126)
ALT: 63 U/L — ABNORMAL HIGH (ref 14–54)
ANION GAP: 10 (ref 5–15)
AST: 59 U/L — AB (ref 15–41)
BILIRUBIN TOTAL: 7.6 mg/dL — AB (ref 0.3–1.2)
BUN: 74 mg/dL — AB (ref 6–20)
CALCIUM: 8.1 mg/dL — AB (ref 8.9–10.3)
CO2: 22 mmol/L (ref 22–32)
Chloride: 105 mmol/L (ref 101–111)
Creatinine, Ser: 2.84 mg/dL — ABNORMAL HIGH (ref 0.44–1.00)
GFR calc Af Amer: 18 mL/min — ABNORMAL LOW (ref >60)
GFR, EST NON AFRICAN AMERICAN: 16 mL/min — AB (ref >60)
GLUCOSE: 118 mg/dL — AB (ref 65–99)
POTASSIUM: 4.8 mmol/L (ref 3.5–5.1)
Sodium: 137 mmol/L (ref 135–145)
TOTAL PROTEIN: 5.3 g/dL — AB (ref 6.5–8.1)

## 2017-10-21 LAB — AMMONIA: Ammonia: 10 umol/L (ref 9–35)

## 2017-10-21 MED ORDER — LEVOTHYROXINE SODIUM 25 MCG PO TABS
125.0000 ug | ORAL_TABLET | Freq: Every day | ORAL | Status: DC
  Administered 2017-10-22 – 2017-10-23 (×2): 125 ug via ORAL
  Filled 2017-10-21 (×4): qty 1

## 2017-10-21 NOTE — Progress Notes (Signed)
Bair hugger has arrived, placed on patient on 38C temperature.

## 2017-10-21 NOTE — Progress Notes (Signed)
RN told by [facility]ad to call [facility], RN called [facility] and got an order for a bear hugger for patient. Will follow up with care team. Husband at bedside.

## 2017-10-21 NOTE — Progress Notes (Signed)
Patient transferred from 21M with 2 RN's. Nurse tech was not able to get oral temp, RN obtained rectal temp- 92.9. Gave warm blankets and NP notified,.

## 2017-10-21 NOTE — Progress Notes (Addendum)
eLink Physician-Brief Progress Note Patient Name: Nancy Baldwin DOB: 01/24/1946 MRN: [id number]   Date of Service  10/21/2017  HPI/Events of Note  Hypothermia - Temp = 96 F.   eICU Interventions  Will order: 1. [employer] Hugger.  2. Blood cultures X 2.      Intervention Category Major Interventions: Other:  [name],[name] 10/21/2017, 8:46 PM

## 2017-10-21 NOTE — Progress Notes (Signed)
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  Of note no organisms were specified during that hospitalization.  She was discharged to home under the care of her husband, apparently was doing okay, eating, getting about in the house using a walker, had baseline intermittent confusion.  Over the past 1-2 days she began to have worsening confusion and worsening discoloration, redness, and heat over the lower extremities.  Her husband called EMS given concerns about her worsening lower extremity wounds.  In the emergency room she was noted to be confused,She had too numerous to count lesions lesions/skin tears/ulcerations all over her body the worst over her lower extremities.  She was afebrile, but noted to be hypotensive with blood pressure in the 80s she was started on norepinephrine and transferred to [facility] with working diagnosis of sepsis.   SUBJECTIVE:  Currently not on pressors.  Awake and follows commands.  VITAL SIGNS: BP (!) 114/47   Pulse 70   Temp (!) 96 F (35.6 C) (Axillary) Comment: Rn Jenn Notified   Resp 17   Ht '5\' 7"'$  (1.702 m)   Wt 266 lb 8.6 oz (120.9 kg)   SpO2 93%   BMI 41.75 kg/m   HEMODYNAMICS:    VENTILATOR SETTINGS:    INTAKE / OUTPUT: I/O last 3 completed shifts: In: 1233.6 [I.V.:483.6; IV Piggyback:750] Out: 732 [Urine:936]  PHYSICAL EXAMINATION: General: Obese elderly female who is not in acute distress. HEENT: Mild  JVD PSY: LAF  neuro: Awake and follows commands although weakly CV: Heart sounds are distant PULM: Decreased air movement.  Decreased breath sounds at the bases KG:URKY, non-tender, bsx4 active  Extremities: Edematous with ecchymosis edema and evidence of cellulitis and dressings Skin: As noted in extremities   LABS:  BMET Recent Labs  Lab 10/19/17 0455 10/20/17 0521 10/21/17 0608  NA 137 137 137  K 5.6* 4.8 4.8  CL 104 105 105  CO2 21* 23 22  BUN 51* 64* 74*  CREATININE 2.53* 2.65* 2.84*  GLUCOSE 206* 157* 118*   Electrolytes Recent Labs  Lab 10/18/17 1653 10/19/17 0455 10/20/17 0521 10/21/17 0608  CALCIUM  --  8.5* 8.2* 8.1*  MG 2.0 2.1 2.1  --   PHOS 5.5* 6.2* 5.9*  --     CBC Recent Labs  Lab 10/19/17 0455 10/20/17 0521 10/21/17 0608  WBC 9.6 15.6* 20.9*  HGB 10.3* 9.9* 10.5*  HCT 32.7* 31.3* 32.6*  PLT 114* 107* 111*    Coag's Recent Labs  Lab 10/18/17 0646 10/21/17 0608  APTT 31  --   INR 2.11 2.36    Sepsis Markers Recent Labs  Lab 10/18/17 0404 10/18/17 0628 10/18/17 1653  LATICACIDVEN 5.83* 4.76*  --   PROCALCITON  --   --  2.67    ABG No results for input(s): PHART, PCO2ART, PO2ART in the last 168 hours.  Liver Enzymes Recent Labs  Lab 10/18/17 0357 10/18/17 0646 10/21/17 0608  AST 141* 103* 59*  ALT 95* 69* 63*  ALKPHOS 419* 314* 244*  BILITOT 5.1* 4.0* 7.6*  ALBUMIN 2.0* 1.5* 1.6*    Cardiac Enzymes No results for input(s): TROPONINI, PROBNP in the last 168 hours.  Glucose Recent Labs  Lab 10/19/17 2315 10/20/17 0804 10/20/17 1225 10/20/17 1731 10/20/17 2214 10/21/17 0721  GLUCAP 161* 142* 124* 105* 114* 100*    Imaging No results found.  STUDIES:   CULTURES: Blood cultures x2 12/8>>>  ANTIBIOTICS: Vancomycin 12/8>>>> Meropenem 12/8>>>  SIGNIFICANT EVENTS:  LINES/TUBES: R IJ TLC 12/8>>>  DISCUSSION: 71 year old female with extensive PMH who presents with cellulitis and associated septic  shock.  10/21/2017 she is a DNR but we will continue aggressive medical care at this time.  ASSESSMENT / PLAN:  Severe sepsis septic shock in the setting of what appears to be diffuse cellulitis involving at least the both lower extremities possibly more extensive. -Also consider urinary tract source >> UA negative 12/8 -10/21/2017 she is being liberated from vasopressor support Plan 10/21/2017 off vasopressor support CVP goal 10-12 Hydrocortisone stress dose steroids as ordered Antibiotics  >> continue meropenem, continue vancomycin for now although likely narrow 12/11 if culture data continues to be negative Appreciate wound care assistance Hold further diuresis 12/11  History of diastolic heart failure  Intake/Output Summary (Last 24 hours) at 10/21/2017 0901 Last data filed at 10/21/2017 0800 Gross per 24 hour  Intake 973.02 ml  Output 486 ml  Net 487.02 ml     Plan Continue telemetry monitoring Hold diuresis 12/10  Acute encephalopathy superimposed on probably what has intermittent hepatic encephalopathy.  10/21/2017 more alert and interactive Plan Anticipate improvement as we reverse underlying metabolic and infectious contributors Continue lactulose and rifaximin Minimize sedating medications as able Haldol as needed, try to minimize  Acute on chronic renal failure; CRI stage II-III at baseline; with baseline creatinine at around 1.4 Non-anion gap metabolic acidosis, lactic acid 12/8 4.76 Borderline hyperkalemia Lab Results  Component Value Date   CREATININE 2.84 (H) 10/21/2017   CREATININE 2.65 (H) 10/20/2017   CREATININE 2.53 (H) 10/19/2017   Recent Labs  Lab 10/19/17 0455 10/20/17 0521 10/21/17 0608  K 5.6* 4.8 4.8    Plan Hold diuretics 12/10 given evolving acute renal insufficiency Bicarbonate drip discontinued Follow volume status, urine output, BMP Replace electro lites as indicated  Macrocytic anemia without evidence of bleeding Recent Labs     10/20/17 0521 10/21/17 0608  HGB 9.9* 10.5*    Plan Send anemia panel Subcu heparin Transfuse per protocol  Thrombocytopenia and coagulopathy in setting of cirrhosis plt 111 Plan Vitamin K Continuing subcu heparin but may need to discontinue if platelets continue to drop  Severe protein calorie malnutrition superimposed on 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 diet Follow LFT, INR  Hypothyroidism Plan Continue Synthroid as ordered  Diabetes CBG (last 3)  Recent Labs    10/20/17 1731 10/20/17 2214 10/21/17 0721  GLUCAP 105* 114* 100*    Plan Continue sliding scale insulin as ordered  FAMILY  - Updates: Sons updated at bedside 12/10.  Family met with palliative care  10/20/2017 and she is a DNR but with continuation of aggressive medical treatment at this time. Could consider transfer to stepdown unit into [facility] if [facility] agrees.  - Inter-disciplinary family meet or Palliative Care meeting.  10/20/2017 palliative care meeting was attended by 2 sons and husband.  She is a DNR.  Currently continue full aggressive care without coding.  Ongoing conversations about futility of care.  Plan to move out of intensive care unit 10/21/2017 and transfer to [facility].  Independent critical care time 30 minutes  Richardson Landry Deloyd Handy ACNP Maryanna Shape PCCM Pager [phone] till 1 pm If no answer page [phone] 10/21/2017, 8:55 AM

## 2017-10-21 NOTE — Progress Notes (Signed)
Pharmacy Antibiotic Note  Nancy Baldwin is a 71 y.o. female admitted on 10/18/2017 with sepsis and cellulitis.  WBC is trending up at 20.9, PCT 2.67.   SCr is trending up at 2.84, est CrCl ~ 24.8 mL/min.  Last vancomycin dose 1750 mg IV on 12/11.   Plan: Continue meropenem 1 g q12h Hold further vancomycin until vancomycin random level 12/13.  Monitor renal fx cx vt prn Consider level prior to next dose vanc if SCr continues to rise  Height: 5\' 7"  (170.2 cm) Weight: 266 lb 8.6 oz (120.9 kg) IBW/kg (Calculated) : 61.6  Temp (24hrs), Avg:95.9 F (35.5 C), Min:95.8 F (35.4 C), Max:96 F (35.6 C)  Recent Labs  Lab 10/18/17 0404 10/18/17 0628 10/18/17 0646 10/18/17 1653 10/19/17 0455 10/20/17 0521 10/21/17 0608  WBC  --   --  2.5* 3.7* 9.6 15.6* 20.9*  CREATININE  --   --  1.81* 2.23* 2.53* 2.65* 2.84*  LATICACIDVEN 5.83* 4.76*  --   --   --   --   --     Estimated Creatinine Clearance: 24.8 mL/min (A) (by C-G formula based on SCr of 2.84 mg/dL (H)).    Allergies  Allergen Reactions  . Amoxicillin Other (See Comments)    Unknown  . Codeine Other (See Comments)    Unknown  . Decongest-Aid [Pseudoephedrine] Other (See Comments)    Unknown  . Decongestant [Oxymetazoline] Other (See Comments)    Unknown  . Metformin And Related Other (See Comments)    Unkown  . Tricor [Fenofibrate] Other (See Comments)    Unknown   Nancy Baldwin, PharmD, BCPS, BCCCP Clinical Pharmacist Clinical phone 10/21/2017 until 3:30PM - #[phone] After hours, please call [phone] 10/21/2017 1:51 PM

## 2017-10-21 NOTE — Progress Notes (Signed)
CSW acknowledges consult for Medicaid assistance. CSW spoke with RN Fleet Contrasachel and asked that she consult Financial Counseling for further assistance with insurance assistance. At this time CSW signing off as there are no further CSW needs at this time. Please re consult as needed.      Claude MangesKierra S. Damel Querry, MSW, LCSW-A Emergency Department Clinical Social Worker [phone]

## 2017-10-22 ENCOUNTER — Inpatient Hospital Stay (HOSPITAL_COMMUNITY): Payer: Medicare Other

## 2017-10-22 LAB — BASIC METABOLIC PANEL
Anion gap: 9 (ref 5–15)
BUN: 85 mg/dL — ABNORMAL HIGH (ref 6–20)
CO2: 22 mmol/L (ref 22–32)
Calcium: 8.1 mg/dL — ABNORMAL LOW (ref 8.9–10.3)
Chloride: 102 mmol/L (ref 101–111)
Creatinine, Ser: 2.84 mg/dL — ABNORMAL HIGH (ref 0.44–1.00)
GFR calc Af Amer: 18 mL/min — ABNORMAL LOW (ref >60)
GFR calc non Af Amer: 16 mL/min — ABNORMAL LOW (ref >60)
Glucose, Bld: 147 mg/dL — ABNORMAL HIGH (ref 65–99)
Potassium: 5.5 mmol/L — ABNORMAL HIGH (ref 3.5–5.1)
Sodium: 133 mmol/L — ABNORMAL LOW (ref 135–145)

## 2017-10-22 LAB — PHOSPHORUS: Phosphorus: 6.4 mg/dL — ABNORMAL HIGH (ref 2.5–4.6)

## 2017-10-22 LAB — GLUCOSE, CAPILLARY
GLUCOSE-CAPILLARY: 211 mg/dL — AB (ref 65–99)
Glucose-Capillary: 195 mg/dL — ABNORMAL HIGH (ref 65–99)

## 2017-10-22 LAB — MAGNESIUM: Magnesium: 2.3 mg/dL (ref 1.7–2.4)

## 2017-10-22 LAB — PROCALCITONIN: PROCALCITONIN: 2.06 ng/mL

## 2017-10-22 MED ORDER — LORAZEPAM 2 MG/ML IJ SOLN
0.2500 mg | Freq: Four times a day (QID) | INTRAMUSCULAR | Status: DC | PRN
  Administered 2017-10-22: 0.25 mg via INTRAVENOUS
  Filled 2017-10-22: qty 1

## 2017-10-22 MED ORDER — SODIUM POLYSTYRENE SULFONATE 15 GM/60ML PO SUSP
15.0000 g | Freq: Once | ORAL | Status: AC
  Administered 2017-10-22: 15 g via ORAL
  Filled 2017-10-22: qty 60

## 2017-10-22 MED ORDER — SODIUM CHLORIDE 0.9% FLUSH
10.0000 mL | INTRAVENOUS | Status: DC | PRN
  Administered 2017-10-22: 20 mL
  Administered 2017-10-22: 10 mL
  Filled 2017-10-22: qty 40

## 2017-10-22 NOTE — Progress Notes (Signed)
Placed order for IV team lab draw.

## 2017-10-22 NOTE — Progress Notes (Signed)
PT Cancellation Note  Patient Details Name: Nancy Baldwin MRN: [id number] DOB: Oct 13, 1946   Cancelled Treatment:    Reason Eval/Treat Not Completed: Patient not medically ready. Per RN, pt with low temperature and potentially being transferred to step-down status. Requesting PT check back in tomorrow for evaluation.   Nancy Baldwin, PT, DPT Acute Rehab Services  Pager: [phone]  Nancy Baldwin 10/22/2017, 10:10 AM

## 2017-10-22 NOTE — Evaluation (Signed)
Occupational Therapy Evaluation Patient Details Name: Nancy Baldwin MRN: [name] DOB: 22-Dec-1945 Today's Date: 10/22/2017    History of Present Illness Pt is a 71 y.o. female admitted with severe septic shock in the setting of diffuse cellulities. 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   Clinical Impression   Pt with active bedrest orders; however, RN requesting OT assist with rolling for rectal temperature and assess cognition at this time. Pt requiring mod assist to roll in bed and able to follow one-step commands during simple tasks. She is very confused at this time but pleasant. Only oriented to self and demonstrating focused (approaching sustained) attention to tasks this session. Feel pt would benefit from continued OT services once medically stable for increased participation. She currently requires total assistance for all ADL. Recommend SNF level rehabilitation once medically stable.     Follow Up Recommendations  SNF;Supervision/Assistance - 24 hour    Equipment Recommendations  Other (comment)(defer to next venue of care)    Recommendations for Other Services       Precautions / Restrictions Precautions Precautions: Fall Precaution Comments: Wounds B LE and UE Restrictions Weight Bearing Restrictions: No      Mobility Bed Mobility Overal bed mobility: Needs Assistance Bed Mobility: Rolling Rolling: Mod assist         General bed mobility comments: RN requested OT assist with rolling for rectal temperature measurement.   Transfers                 General transfer comment: Not tested as pt with BR orders and decreased body temperature.     Balance                                           ADL either performed or assessed with  clinical judgement   ADL Overall ADL's : Needs assistance/impaired                                       General ADL Comments: Total assistance at this time.      Vision   Additional Comments: Making eye contact with therapist and tracking.      Perception     Praxis      Pertinent Vitals/Pain Pain Assessment: No/denies pain     Hand Dominance     Extremity/Trunk Assessment Upper Extremity Assessment Upper Extremity Assessment: RUE deficits/detail;Generalized weakness RUE Deficits / Details: Decreased shoulder movement to 0-15 degrees.    Lower Extremity Assessment Lower Extremity Assessment: Defer to PT evaluation       Communication     Cognition Arousal/Alertness: Awake/alert Behavior During Therapy: WFL for tasks assessed/performed Overall Cognitive Status: Impaired/Different from baseline Area of Impairment: Orientation;Attention;Memory;Following commands;Safety/judgement                 Orientation Level: Disoriented to;Place;Time;Situation Current Attention Level: Focused Memory: Decreased short-term memory Following Commands: Follows one step commands consistently Safety/Judgement: Decreased awareness of deficits     General Comments: Pt follows simple one-step commands. [place] and inappropriately answering questions at times.    General Comments       Exercises  Shoulder Instructions      Home Living Family/patient expects to be discharged to:: Skilled nursing facility Living Arrangements: Spouse/significant other                                      Prior Functioning/Environment Level of Independence: Needs assistance        Comments: No family present to report. Requires significant assistance per chart.         OT Problem List: Decreased strength;Decreased activity tolerance;Impaired balance (sitting and/or standing);Decreased safety awareness;Decreased knowledge of  use of DME or AE;Decreased knowledge of precautions;Decreased cognition;Decreased range of motion;Impaired UE functional use      OT Treatment/Interventions: Self-care/ADL training;Therapeutic exercise;Energy conservation;DME and/or AE instruction;Therapeutic activities;Cognitive remediation/compensation;Balance training;Visual/perceptual remediation/compensation;Patient/family education    OT Goals(Current goals can be found in the care plan section) Acute Rehab OT Goals Patient Stated Goal: did not state OT Goal Formulation: Patient unable to participate in goal setting Time For Goal Achievement: 11/05/17 Potential to Achieve Goals: Fair ADL Goals Pt Will Perform Grooming: with set-up;bed level Additional ADL Goal #1: Pt will follow 3/4 multi-step commands with no more than 1 verbal cue. Additional ADL Goal #2: Pt will complete bed mobility with overall min assist in preparation for participation in ADL.  OT Frequency: Min 1X/week   Barriers to D/C:            Co-evaluation              AM-PAC PT "6 Clicks" Daily Activity     Outcome Measure Help from another person eating meals?: Total Help from another person taking care of personal grooming?: Total Help from another person toileting, which includes using toliet, bedpan, or urinal?: Total Help from another person bathing (including washing, rinsing, drying)?: Total Help from another person to put on and taking off regular upper body clothing?: Total Help from another person to put on and taking off regular lower body clothing?: Total 6 Click Score: 6   End of Session Nurse Communication: Other (comment)(RN present throughout session)  Activity Tolerance: Treatment limited secondary to medical complications (Comment) Patient left: with call bell/phone within reach;in bed;with nursing/sitter in room  OT Visit Diagnosis: Other abnormalities of gait and mobility (R26.89)                Time: 7253-6644: 0943-0955 OT Time Calculation  (min): 12 min Charges:  OT General Charges $OT Visit: 1 Visit OT Evaluation $OT Eval Moderate Complexity: 1 Mod G-Codes:     Nancy Sectionharity A Nancy Graham, MS OTR/L  Pager: [phone]   Nancy Baldwin 10/22/2017, 11:08 AM

## 2017-10-22 NOTE — Progress Notes (Signed)
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  Of note no organisms were specified during that hospitalization.  She was discharged to home under the care of her husband, apparently was doing okay, eating, getting about in the house using a walker, had baseline intermittent confusion.  Over the past 1-2 days she began to have worsening confusion and worsening discoloration, redness, and heat over the lower extremities.  Her husband called EMS given concerns about her worsening lower extremity wounds.  In the emergency room she was noted to be confused,She had too numerous to count lesions lesions/skin tears/ulcerations all over her body the worst over her lower extremities.  She was afebrile, but noted to be hypotensive with blood pressure in the 80s she was started on norepinephrine and admitted to the ICU.   Assessment & Plan: Active Problems:   Septic shock (HCC)   Cellulitis of lower extremity   Palliative care by specialist   DNR (do not resuscitate)   NASH (nonalcoholic steatohepatitis)  Severe sepsis septic shock in the setting of what appears to be diffuse cellulitis involving at least the both lower extremities possibly more extensive. -Also consider urinary tract source >> UA negative 12/8 -She is off pressors since 12/11, was on stress dose steroids and these were discontinued on 12/11 as well -Blood pressure stable, 110/47 this morning. -Continue meropenem, today day 5 -Still hypothermic, will probably need to go back to stepdown if  cannot maintain adequate core body temperature off bearhugger  History of diastolic heart failure with anasarca -Fluid resuscitated in setting of septic shock, blood pressure today stable, will probably need diuresis and attempt to start on 12/13  Acute encephalopathy superimposed on probably what has intermittent hepatic encephalopathy -Anticipate improvement as we reverse underlying metabolic and infectious contributors -Continue lactulose and rifaximin -Minimize sedating medications as able  Acute on chronic renal failure; CRI stage II-III at baseline; with baseline creatinine at around 1.4 Non-anion gap metabolic acidosis -Creatinine stable, climbed to 2.8 on 12/11 and stable today, with improvement in blood pressure I expect kidney function to get better -Mild hyperkalemia, give Kayexalate x1 -Hold diuretics, will need to resume tomorrow  Anemia of chronic disease -Hemoglobin stable, no bleeding  Thrombocytopenia and coagulopathy in setting of cirrhosis -s/p vitamin K, monitor INR  Severe protein calorie malnutrition superimposed on cirrhosis in the setting of NASH  Hypothyroidism -Continue Synthroid as ordered  Diabetes  -controlled, most recent hemoglobin A1c 5.6  Plan -Continue sliding scale insulin as ordered   DVT prophylaxis: heparin Code Status: DNR Family Communication: no family at bedside Disposition Plan: TBD  Consultants:   PCCM  Palliative   Procedures:   None   Antimicrobials:  Meropenem 12/8 >>  Vancomycin 12/8 >> 12/11   Subjective: -lethargic, opens eyes when prompted, no complaints   Objective: Vitals:   10/22/17 0335 10/22/17 0542 10/22/17 0726 10/22/17 0949  BP:      Pulse:      Resp:      Temp:  (!) 97.3 F (36.3 C) (!) 97.4 F (36.3 C) (!) 95.9 F (35.5 C)  TempSrc:  Axillary Oral Rectal  SpO2:      Weight: 120.9 kg (266 lb 8.6 oz)     Height:        Intake/Output Summary (Last 24 hours) at 10/22/2017 1016 Last  data filed at 10/22/2017 0659 Gross per 24 hour  Intake 860 ml  Output 250 ml  Net 610 ml   Filed Weights   10/21/17 0500 10/21/17 2009 10/22/17 0335  Weight: 120.9 kg (266 lb 8.6 oz) 122.7 kg (270 lb 8.1 oz) 120.9 kg (266 lb 8.6 oz)    Examination:  Constitutional: lethargic  Eyes:  lids and conjunctivae normal ENMT: Mucous membranes are moist.  Respiratory: clear to auscultation bilaterally, no wheezing, no crackles. Distant sounds  Cardiovascular: Regular rate and rhythm, no murmurs / rubs / gallops. 2+ LE edema. 2+ pedal pulses.  Abdomen: no tenderness. Bowel sounds positive.  Skin: Numerous lower extremity ulcerations/cellulitis Neurologic: CN 2-12 grossly intact. Strength 5/5 in all 4.  Psychiatric: Normal judgment and insight. Alert and oriented x 3. Normal mood.    Data Reviewed: I have independently reviewed following labs and imaging studies   CBC: Recent Labs  Lab 10/18/17 0357 10/18/17 0646 10/18/17 1653 10/19/17 0455 10/20/17 0521 10/21/17 0608  WBC 3.2* 2.5* 3.7* 9.6 15.6* 20.9*  NEUTROABS 2.5 1.7  --   --   --   --   HGB 11.9* 9.3* 10.2* 10.3* 9.9* 10.5*  HCT 38.6 29.7* 31.9* 32.7* 31.3* 32.6*  MCV 108.4* 107.6* 104.2* 106.5* 105.0* 103.5*  PLT 112* 83* 100* 114* 107* 111*   Basic Metabolic Panel: Recent Labs  Lab 10/18/17 0646 10/18/17 1653 10/19/17 0455 10/20/17 0521 10/21/17 0608 10/22/17 0623  NA 136  --  137 137 137 133*  K 5.0  --  5.6* 4.8 4.8 5.5*  CL 110  --  104 105 105 102  CO2 18*  --  21* 23 22 22   GLUCOSE 123*  --  206* 157* 118* 147*  BUN 50*  --  51* 64* 74* 85*  CREATININE 1.81* 2.23* 2.53* 2.65* 2.84* 2.84*  CALCIUM 7.9*  --  8.5* 8.2* 8.1* 8.1*  MG  --  2.0 2.1 2.1  --  2.3  PHOS  --  5.5* 6.2* 5.9*  --  6.4*   GFR: Estimated Creatinine Clearance: 24.4 mL/min (A) (by C-G formula based on SCr of 2.84 mg/dL (H)). Liver Function Tests: Recent Labs  Lab 10/18/17 0357 10/18/17 0646 10/21/17 0608  AST 141* 103* 59*    ALT 95* 69* 63*  ALKPHOS 419* 314* 244*  BILITOT 5.1* 4.0* 7.6*  PROT 5.8* 4.3* 5.3*  ALBUMIN 2.0* 1.5* 1.6*   No results for input(s): LIPASE, AMYLASE in the last 168 hours. Recent Labs  Lab 10/18/17 0624 10/21/17 0608  AMMONIA 58* 10   Coagulation Profile: Recent Labs  Lab 10/18/17 0646 10/21/17 0608  INR 2.11 2.36   Cardiac Enzymes: No results for input(s): CKTOTAL, CKMB, CKMBINDEX, TROPONINI in the last 168 hours. BNP (last 3 results) No results for input(s): PROBNP in the last 8760 hours. HbA1C: No results for input(s): HGBA1C in the last 72 hours. CBG: Recent Labs  Lab 10/20/17 2214 10/21/17 0721 10/21/17 1112 10/21/17 1510 10/21/17 2124  GLUCAP 114* 100* 112* 147* 184*   Lipid Profile: No results for input(s): CHOL, HDL, LDLCALC, TRIG, CHOLHDL, LDLDIRECT in the last 72 hours. Thyroid Function Tests: No results for input(s): TSH, T4TOTAL, FREET4, T3FREE, THYROIDAB in the last 72 hours. Anemia Panel: No results for input(s): VITAMINB12, FOLATE, FERRITIN, TIBC,  IRON, RETICCTPCT in the last 72 hours. Urine analysis:    Component Value Date/Time   COLORURINE AMBER (A) 10/18/2017 0406   APPEARANCEUR HAZY (A) 10/18/2017 0406   LABSPEC 1.017 10/18/2017 0406   PHURINE 5.0 10/18/2017 0406   GLUCOSEU NEGATIVE 10/18/2017 0406   HGBUR NEGATIVE 10/18/2017 0406   BILIRUBINUR NEGATIVE 10/18/2017 0406   KETONESUR NEGATIVE 10/18/2017 0406   PROTEINUR NEGATIVE 10/18/2017 0406   NITRITE NEGATIVE 10/18/2017 0406   LEUKOCYTESUR NEGATIVE 10/18/2017 0406   Sepsis Labs: Invalid input(s): PROCALCITONIN, LACTICIDVEN  Recent Results (from the past 240 hour(s))  Blood Culture (routine x 2)     Status: None (Preliminary result)   Collection Time: 10/18/17  4:00 AM  Result Value Ref Range Status   Specimen Description BLOOD RIGHT ARM  Final   Special Requests   Final    BOTTLES DRAWN AEROBIC ONLY Blood Culture adequate volume   Culture NO GROWTH 4 DAYS  Final   Report  Status PENDING  Incomplete  Blood Culture (routine x 2)     Status: None (Preliminary result)   Collection Time: 10/18/17  4:22 AM  Result Value Ref Range Status   Specimen Description BLOOD RIGHT ARM  Final   Special Requests   Final    BOTTLES DRAWN AEROBIC ONLY Blood Culture adequate volume   Culture NO GROWTH 4 DAYS  Final   Report Status PENDING  Incomplete  MRSA PCR Screening     Status: None   Collection Time: 10/18/17 12:24 PM  Result Value Ref Range Status   MRSA by PCR NEGATIVE NEGATIVE Final    Comment:        The GeneXpert MRSA Assay (FDA approved for NASAL specimens only), is one component of a comprehensive MRSA colonization surveillance program. It is not intended to diagnose MRSA infection nor to guide or monitor treatment for MRSA infections.       Radiology Studies: Dg Chest Port 1 View  Result Date: 10/22/2017 CLINICAL DATA:  Shortness of breath. EXAM: PORTABLE CHEST 1 VIEW COMPARISON:  One-view chest x-ray 10/18/2017. FINDINGS: Heart size is normal. A right IJ catheter is stable. There lung volumes are slightly improved. Pulmonary vascular congestion remains. Atelectasis or scarring is stable at the left base. IMPRESSION: 1. Slight improved aeration without significant change in mild pulmonary vascular congestion. 2. Scarring or atelectasis at the left base is stable. Electronically Signed   By: Marin Roberts M.D.   On: 10/22/2017 07:42     Scheduled Meds: . aspirin  81 mg Oral Daily  . calcium carbonate  1 tablet Oral BID  . haloperidol  0.5 mg Oral QHS  . heparin  5,000 Units Subcutaneous Q8H  . insulin aspart  0-15 Units Subcutaneous TID WC  . lactulose  30 g Oral BID  . levothyroxine  125 mcg Oral QAC breakfast  . mouth rinse  15 mL Mouth Rinse BID  . pantoprazole (PROTONIX) IV  40 mg Intravenous Daily  . rifaximin  550 mg Oral BID   Continuous Infusions: . sodium chloride 10 mL/hr at 10/21/17 0600  . meropenem (MERREM) IV Stopped  (10/22/17 0427)    Pamella Pert, MD, PhD [facility] Pager [phone]  If 7PM-7AM, please contact night-coverage [email or web address] Password [email or web address] 10/22/2017, 10:16 AM

## 2017-10-22 NOTE — Progress Notes (Signed)
Patient ID: Celesta GentileKacie S Mccort, female   DOB: 01-Jun-1946, 71 y.o.   MRN: [id number]  This NP visited patient at the bedside as a follow up to  [facility].  Patient is confused to time and place and has no insight into her current medical situation.  Placed call to husband and had  continued conversation regarding her multiple comorbidities and high risk for decompensation.  Again stressed the importance of continued conversation regarding overall plan of care and treatment options,  ensuring decisions are within the context of the patients values and GOCs.   Husband agrees to meet me tomorrow afternoon at 3:00.  Questions and concerns addressed  Time in  0745       Time out   0820 total time spent on the unit was 35 minutes  Greater than 50% of the time was spent in counseling and coordination of care  Lorinda CreedMary Tyshea Imel NP  Palliative Medicine Team Team Phone # [phone] Pager [phone]    Q

## 2017-10-22 NOTE — Progress Notes (Signed)
Wound care done

## 2017-10-22 NOTE — Care Management Important Message (Signed)
Important Message  Patient Details  Name: Nancy Baldwin MRN: [id number] Date of Birth: Jan 29, 1946   Medicare Important Message Given:  Yes    Deionna Marcantonio Stefan ChurchBratton 10/22/2017, 1:03 PM

## 2017-10-22 NOTE — Plan of Care (Signed)
  Fluid Volume: Hemodynamic stability will improve 10/22/2017 0215 - Progressing by Jeanella Flatteryhomas, Staton Markey T, RN   Clinical Measurements: Signs and symptoms of infection will decrease 10/22/2017 0215 - Progressing by Jeanella Flatteryhomas, Marquett Bertoli T, RN   Skin Integrity: Skin integrity will improve 10/22/2017 0215 - Progressing by Jeanella Flatteryhomas, Taeja Debellis T, RN   Clinical Measurements: Ability to avoid or minimize complications of infection will improve 10/22/2017 0215 - Progressing by Jeanella Flatteryhomas, Kirstyn Lean T, RN

## 2017-10-22 NOTE — Progress Notes (Signed)
Spoke to MD this morning, MD noticed that patient didn't look good and that she was step-down status. RN states that this is a Field seismologistmed-surg floor. MD relays message to remove bair hugger and if temp drops then patient will be transferred. This RN told day shift RN and Water quality scientistunit director.

## 2017-10-22 NOTE — Progress Notes (Signed)
Pt is hyper-restless pulling telemetry leads MD order 0.25 of Ativan q6 PRN.

## 2017-10-22 NOTE — Progress Notes (Signed)
Paged MD Molli Knock(Yacoub) about patients temperature, labs, and plan of care. MD plans to continue antibiotic treatment and continue bair hugger on this floor. Will not repeat labs right now- like lactic acid. RN will continue to try to get an oral or axillary temp. Patients skin is cool to touch due to excessive 3rd spacing/weeping.

## 2017-10-22 NOTE — Progress Notes (Signed)
Updated elink/critical care of patients rectal temp.

## 2017-10-22 NOTE — Progress Notes (Signed)
Followed orders with warm blankets. Patient rectal temperature 95.8. Paged NP on call Craige CottaKirby with [facility]. Awaiting on possible orders.   Jamaiyah Pyle, RN \

## 2017-10-22 NOTE — Progress Notes (Signed)
Paged NP on call K.Kirby about patient temperature.  Awaiting on possible orders.  Will continue to monitor.  Amarea Macdowell, RN

## 2017-10-23 LAB — COMPREHENSIVE METABOLIC PANEL
ALBUMIN: 1.6 g/dL — AB (ref 3.5–5.0)
ALT: 71 U/L — ABNORMAL HIGH (ref 14–54)
AST: 94 U/L — ABNORMAL HIGH (ref 15–41)
Alkaline Phosphatase: 309 U/L — ABNORMAL HIGH (ref 38–126)
Anion gap: 9 (ref 5–15)
BUN: 89 mg/dL — ABNORMAL HIGH (ref 6–20)
CALCIUM: 8.4 mg/dL — AB (ref 8.9–10.3)
CO2: 21 mmol/L — ABNORMAL LOW (ref 22–32)
CREATININE: 3.05 mg/dL — AB (ref 0.44–1.00)
Chloride: 105 mmol/L (ref 101–111)
GFR calc Af Amer: 17 mL/min — ABNORMAL LOW (ref >60)
GFR calc non Af Amer: 14 mL/min — ABNORMAL LOW (ref >60)
Glucose, Bld: 199 mg/dL — ABNORMAL HIGH (ref 65–99)
Potassium: 4.7 mmol/L (ref 3.5–5.1)
Sodium: 135 mmol/L (ref 135–145)
TOTAL PROTEIN: 5.5 g/dL — AB (ref 6.5–8.1)
Total Bilirubin: 6.9 mg/dL — ABNORMAL HIGH (ref 0.3–1.2)

## 2017-10-23 LAB — CBC
HEMATOCRIT: 32.5 % — AB (ref 36.0–46.0)
Hemoglobin: 10.8 g/dL — ABNORMAL LOW (ref 12.0–15.0)
MCH: 33.9 pg (ref 26.0–34.0)
MCHC: 33.2 g/dL (ref 30.0–36.0)
MCV: 101.9 fL — ABNORMAL HIGH (ref 78.0–100.0)
Platelets: 107 10*3/uL — ABNORMAL LOW (ref 150–400)
RBC: 3.19 MIL/uL — ABNORMAL LOW (ref 3.87–5.11)
RDW: 18.8 % — AB (ref 11.5–15.5)
WBC: 21.7 10*3/uL — ABNORMAL HIGH (ref 4.0–10.5)

## 2017-10-23 LAB — GLUCOSE, CAPILLARY
GLUCOSE-CAPILLARY: 186 mg/dL — AB (ref 65–99)
GLUCOSE-CAPILLARY: 188 mg/dL — AB (ref 65–99)
GLUCOSE-CAPILLARY: 217 mg/dL — AB (ref 65–99)
Glucose-Capillary: 174 mg/dL — ABNORMAL HIGH (ref 65–99)
Glucose-Capillary: 162 mg/dL — ABNORMAL HIGH (ref 65–99)
Glucose-Capillary: 213 mg/dL — ABNORMAL HIGH (ref 65–99)

## 2017-10-23 LAB — MRSA PCR SCREENING: MRSA by PCR: NEGATIVE

## 2017-10-23 LAB — CULTURE, BLOOD (ROUTINE X 2)
CULTURE: NO GROWTH
Culture: NO GROWTH
Special Requests: ADEQUATE
Special Requests: ADEQUATE

## 2017-10-23 LAB — PROTIME-INR
INR: 1.84
PROTHROMBIN TIME: 21.1 s — AB (ref 11.4–15.2)

## 2017-10-23 LAB — PROCALCITONIN: Procalcitonin: 1.84 ng/mL

## 2017-10-23 LAB — MAGNESIUM: Magnesium: 2.3 mg/dL (ref 1.7–2.4)

## 2017-10-23 LAB — LACTIC ACID, PLASMA: Lactic Acid, Venous: 2.8 mmol/L (ref 0.5–1.9)

## 2017-10-23 LAB — PHOSPHORUS: PHOSPHORUS: 6.6 mg/dL — AB (ref 2.5–4.6)

## 2017-10-23 MED ORDER — PRO-STAT SUGAR FREE PO LIQD
30.0000 mL | Freq: Three times a day (TID) | ORAL | Status: DC
  Administered 2017-10-23 (×2): 30 mL via ORAL
  Filled 2017-10-23 (×5): qty 30

## 2017-10-23 MED ORDER — FUROSEMIDE 10 MG/ML IJ SOLN
20.0000 mg | Freq: Two times a day (BID) | INTRAMUSCULAR | Status: DC
  Administered 2017-10-23 – 2017-10-24 (×3): 20 mg via INTRAVENOUS
  Filled 2017-10-23 (×3): qty 2

## 2017-10-23 MED ORDER — HYDROCORTISONE NA SUCCINATE PF 100 MG IJ SOLR
50.0000 mg | Freq: Two times a day (BID) | INTRAMUSCULAR | Status: DC
  Administered 2017-10-23 – 2017-10-25 (×5): 50 mg via INTRAVENOUS
  Filled 2017-10-23 (×5): qty 2

## 2017-10-23 MED ORDER — LIDOCAINE 5 % EX PTCH: 1.0000 | MEDICATED_PATCH | CUTANEOUS | Status: DC

## 2017-10-23 MED ORDER — ADULT MULTIVITAMIN W/MINERALS CH
1.0000 | ORAL_TABLET | Freq: Every day | ORAL | Status: DC
  Administered 2017-10-23 – 2017-10-26 (×2): 1 via ORAL
  Filled 2017-10-23 (×4): qty 1

## 2017-10-23 NOTE — Evaluation (Signed)
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 Of note no organisms were specified during that hospitalization. She was discharged to home under the care of her husband, apparently was doing okay, eating, getting about in the house using a walker, had baseline intermittent confusion. Over the past 1-2 days she began to have worsening confusion and worsening discoloration, redness, and heat over the lower extremities. Her husband called EMS given concerns about her worsening lower extremity wounds. In the emergency room she was noted to be confused,She had too numerous to count lesions lesions/skin tears/ulcerations all over her body the worst over her lower extremities. She was afebrile, but noted to be hypotensive with blood pressure in the 80s she was started on norepinephrine and admitted to the ICU.  Clinical Impression  Pt admitted with above diagnosis. Pt currently with functional limitations due to the deficits listed below (see PT Problem List). Pt is still on bedrest but bed level eval completed.  Will need SNF at d/c for therapy to gain strength.  Will followa cutely.  Pt will benefit from skilled PT to increase their independence and safety with mobility to allow discharge to the venue listed below.      Follow Up Recommendations SNF;Supervision/Assistance - 24 hour    Equipment Recommendations  None recommended by PT    Recommendations for Other Services       Precautions / Restrictions Precautions Precautions: Fall Precaution Comments: Wounds B LE and  UE Restrictions Weight Bearing Restrictions: No      Mobility  Bed Mobility Overal bed mobility: Needs Assistance Bed Mobility: Rolling Rolling: Total assist         General bed mobility comments: Pt unable to assist with rolling today.  Lethargic and very little movement  Transfers                 General transfer comment: Pt still with BR orders.   Ambulation/Gait                Stairs            Wheelchair Mobility    Modified Rankin (Stroke Patients Only)       Balance                                             Pertinent Vitals/Pain Pain Assessment: Faces Faces Pain Scale: Hurts little more Pain Location: generalized Pain Descriptors / Indicators: Aching;Grimacing;Guarding Pain Intervention(s): Limited activity within patient's tolerance;Monitored during session;Repositioned;Premedicated before session   98% on 2.5L, 81 bpm, 127/67.   Home Living Family/patient expects to be discharged to:: Skilled nursing facility Living Arrangements: Spouse/significant other Available Help at Discharge: Family Type of Home: House Home Access: Stairs to enter Entrance Stairs-Rails: Right;Left;Can reach both Entrance Stairs-Number of Steps: 7 Home Layout: One level Home Equipment: Walker - 2 wheels;Wheelchair - manual;Cane - single point;Shower seat      Prior Function Level of Independence: Needs assistance   Gait / Transfers Assistance Needed: able to use her walker in the home   ADL's /  Homemaking Assistance Needed: Reports that her husband assists her with bathing, dressing, and toiletting.   Comments: No family present to report. Requires significant assistance per chart.      Hand Dominance   Dominant Hand: Right    Extremity/Trunk Assessment   Upper Extremity Assessment Upper Extremity Assessment: Defer to OT evaluation    Lower Extremity Assessment Lower Extremity Assessment: RLE deficits/detail;LLE  deficits/detail RLE Deficits / Details: grossly 2-/5 LLE Deficits / Details: grossly 2-/5    Cervical / Trunk Assessment Cervical / Trunk Assessment: Kyphotic  Communication   Communication: No difficulties  Cognition Arousal/Alertness: Awake/alert Behavior During Therapy: WFL for tasks assessed/performed Overall Cognitive Status: Impaired/Different from baseline Area of Impairment: Orientation;Attention;Memory;Following commands;Safety/judgement                 Orientation Level: Disoriented to;Place;Time;Situation Current Attention Level: Focused Memory: Decreased short-term memory Following Commands: Follows one step commands consistently Safety/Judgement: Decreased awareness of deficits     General Comments: Pt follows simple one-step commands. [place] and inappropriately answering questions at times.       General Comments General comments (skin integrity, edema, etc.): multiple wounds noted B LEs, including small sores, large sore on R foot with 100% slough, blood blister on R foot; noted weeping of wounds     Exercises General Exercises - Upper Extremity Shoulder Flexion: PROM;Both;5 reps;Supine Elbow Flexion: PROM;Both;5 reps;Supine Elbow Extension: PROM;Both;5 reps;Supine General Exercises - Lower Extremity Ankle Circles/Pumps: PROM;Both;5 reps;Supine Heel Slides: PROM;Both;5 reps;Supine Hip ABduction/ADduction: PROM;Both;5 reps;Supine   Assessment/Plan    PT Assessment Patient needs continued PT services  PT Problem List Decreased strength;Decreased mobility;Decreased coordination;Obesity;Decreased activity tolerance;Decreased balance;Decreased knowledge of use of DME;Decreased safety awareness       PT Treatment Interventions DME instruction;Therapeutic activities;Gait training;Therapeutic exercise;Patient/family education;Stair training;Balance training;Functional mobility training;Neuromuscular re-education    PT Goals  (Current goals can be found in the Care Plan section)  Acute Rehab PT Goals Patient Stated Goal: did not state PT Goal Formulation: Patient unable to participate in goal setting Time For Goal Achievement: 11/06/17 Potential to Achieve Goals: Good    Frequency Min 2X/week   Barriers to discharge        Co-evaluation               AM-PAC PT "6 Clicks" Daily Activity  Outcome Measure Difficulty turning over in bed (including adjusting bedclothes, sheets and blankets)?: Unable Difficulty moving from lying on back to sitting on the side of the bed? : Unable Difficulty sitting down on and standing up from a chair with arms (e.g., wheelchair, bedside commode, etc,.)?: Unable Help needed moving to and from a bed to chair (including a wheelchair)?: Total Help needed walking in hospital room?: Total Help needed climbing 3-5 steps with a railing? : Total 6 Click Score: 6    End of Session Equipment Utilized During Treatment: Oxygen Activity Tolerance: Patient limited by fatigue Patient left: in bed;with call bell/phone within reach;with bed alarm set Nurse Communication: Mobility status;Need for lift equipment PT Visit Diagnosis: Unsteadiness on feet (R26.81);History of falling (Z91.81);Muscle weakness (generalized) (M62.81);Other abnormalities of gait and mobility (R26.89);Difficulty in walking, not elsewhere classified (R26.2)    Time: [id number] PT Time Calculation (min) (ACUTE ONLY): 10 min   Charges:   PT Evaluation $PT Eval Moderate Complexity: 1 Mod     PT G Codes:        Leul Narramore,PT Acute Rehabilitation [phone] (pager)   Berline Lopesawn F [place] 10/23/2017, 1:41 PM

## 2017-10-23 NOTE — Progress Notes (Signed)
[facility]  Patient Status: Active (receiving services up to time of hospitalization)  [facility] is providing the following services: RN and PT  If patient discharges after hours, please call [phone].   Nancy FurnishDonna Baldwin 10/23/2017, 10:51 AM

## 2017-10-23 NOTE — Progress Notes (Addendum)
Patient ID: Nancy GentileKacie S Danese, female   DOB: 04/12/1946, 71 y.o.   MRN: [id number]  This NP visited patient at the bedside with patient's husband as a follow up for scheduled discussion regarding goals of care, emotional support, and other palliative medicine needs.  Dr. Elvera LennoxGherghe  updated husband on medical condition.  Currently patient is minimally responsive, unable to follow commands or safely take orals.  NP then continued conversation regarding diagnosis, prognosis, goals of care, and anticipatory care needs.  Discussed patient's high risk of decompensation and long term poor prognosis.  Husband expresses hope for improvement and also  verbalizes an understanding of the seriousness of the medical situation.  He understands the difficult decsions facing him now and into the future regarding his wife's "well being"  Discussed  the importance of continued conversation with their  medical providers regarding overall plan of care and treatment options,  ensuring decisions are within the context of the patients values and GOCs.  Questions and concerns addressed  Time in   1730        Time out   1600  Total time spent on the unit was 60 minutes  PMT will continue to support holistically.  Discussed with bedside RN  Greater than 50% of the time was spent in counseling and coordination of care  Lorinda CreedMary Larach NP  Palliative Medicine Team Team Phone # [phone] Pager [phone]

## 2017-10-23 NOTE — Progress Notes (Signed)
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  Of note no organisms were specified during that hospitalization.  She was discharged to home under the care of her husband, apparently was doing okay, eating, getting about in the house using a walker, had baseline intermittent confusion.  Over the past 1-2 days she began to have worsening confusion and worsening discoloration, redness, and heat over the lower extremities.  Her husband called EMS given concerns about her worsening lower extremity wounds.  In the emergency room she was noted to be confused,She had too numerous to count lesions lesions/skin tears/ulcerations all over her body the worst over her lower extremities.  She was afebrile, but noted to be hypotensive with blood pressure in the 80s she was started on norepinephrine and admitted to the ICU.   Assessment & Plan: Active Problems:   Septic shock (HCC)   Cellulitis of lower extremity   Palliative care by specialist   DNR (do not resuscitate)   NASH (nonalcoholic steatohepatitis)  Severe sepsis septic shock in the setting of what appears to be diffuse cellulitis involving at least the both lower extremities possibly more extensive. -Also consider urinary tract source >> UA negative 12/8 -She is off pressors since 12/11, was on stress dose steroids and these were discontinued on 12/11 as well -Blood pressure stable, 110/47 this morning. -Continue meropenem, today day 6 -Persistently hypothermic overnight requiring transfer back to  stepdown, will place back on steroids as may have a relative degree of adrenal insufficiency due to recent steroids  History of diastolic heart failure with anasarca -Fluid resuscitated in setting of septic shock, blood pressure today stable, start IV Lasix today and closely monitor renal function and fluid status  Acute encephalopathy superimposed on probably what has intermittent hepatic encephalopathy -Anticipate improvement as we reverse underlying metabolic and infectious contributors -Continue lactulose and rifaximin -Minimize sedating medications as able  Acute on chronic renal failure; CRI stage II-III at baseline; with baseline creatinine at around 1.4 Non-anion gap metabolic acidosis -Creatinine stable, climbed to 2.8 on 12/11 and overall stable, 3 today, with improvement in blood pressure I expect kidney function to get better -Resume diuretics today  Anemia of chronic disease -Hemoglobin stable, no bleeding  Thrombocytopenia and coagulopathy in setting of cirrhosis -s/p vitamin K, monitor INR  Severe protein calorie malnutrition superimposed on cirrhosis in the setting of NASH  Hypothyroidism -Continue Synthroid as ordered  Diabetes  -controlled, most recent hemoglobin A1c 5.6  Plan -Continue sliding scale insulin as ordered   DVT prophylaxis: heparin Code Status: DNR Family Communication: no family at bedside Disposition Plan: TBD  Consultants:   PCCM  Palliative   Procedures:   None   Antimicrobials:  Meropenem 12/8 >>  Vancomycin 12/8 >> 12/11   Subjective: -Still lethargic but wakes up and answer some basic questions  Objective: Vitals:   10/23/17 0531 10/23/17 0600 10/23/17 0630 10/23/17 0800  BP:  (!) 101/57  (!) 127/105  Pulse:  78  82  Resp:  12  12  Temp: (!) 95.4 F (35.2 C)  (!) 95.9 F (35.5 C) (!) 96.4 F (35.8 C)  TempSrc: Rectal  Rectal Rectal  SpO2:  97%  97%  Weight:      Height:        Intake/Output Summary  (Last 24 hours) at 10/23/2017 1133 Last data filed at 10/23/2017 0454 Gross per 24 hour  Intake 560 ml  Output 953 ml  Net -393 ml   Filed Weights   10/21/17 2009 10/22/17 0335 10/23/17 0315  Weight: 122.7 kg (270 lb 8.1 oz) 120.9 kg (266 lb 8.6 oz) 119.7 kg (263 lb 14.3 oz)    Examination:  Constitutional: NAD, more alert this morning  ENMT: Mucous membranes are moist.  Respiratory: clear to auscultation bilaterally, no wheezing, no crackles. Distant sounds  Cardiovascular: Regular rate and rhythm, no murmurs / rubs / gallops. 2 + pitting LE edema. 2+ pedal pulses. Anasarca  Abdomen: no tenderness. Bowel sounds positive.  Skin: numerous LE ulcers/cellulitis improving  Neurologic: non focal    Data Reviewed: I have independently reviewed following labs and imaging studies   CBC: Recent Labs  Lab 10/18/17 0357 10/18/17 0646 10/18/17 1653 10/19/17 0455 10/20/17 0521 10/21/17 0608 10/23/17 0354  WBC 3.2* 2.5* 3.7* 9.6 15.6* 20.9* 21.7*  NEUTROABS 2.5 1.7  --   --   --   --   --   HGB 11.9* 9.3* 10.2* 10.3* 9.9* 10.5* 10.8*  HCT 38.6 29.7* 31.9* 32.7* 31.3* 32.6* 32.5*  MCV 108.4* 107.6* 104.2* 106.5* 105.0* 103.5* 101.9*  PLT 112* 83* 100* 114* 107* 111* 107*   Basic Metabolic Panel: Recent Labs  Lab 10/18/17 1653 10/19/17 0455 10/20/17 0521 10/21/17 0608 10/22/17 0623 10/23/17 0354  NA  --  137 137 137 133* 135  K  --  5.6* 4.8 4.8 5.5* 4.7  CL  --  104 105 105 102 105  CO2  --  21* 23 22 22  21*  GLUCOSE  --  206* 157* 118* 147* 199*  BUN  --  51* 64* 74* 85* 89*  CREATININE 2.23* 2.53* 2.65* 2.84* 2.84* 3.05*  CALCIUM  --  8.5* 8.2* 8.1* 8.1* 8.4*  MG 2.0 2.1 2.1  --  2.3 2.3  PHOS 5.5* 6.2* 5.9*  --  6.4* 6.6*   GFR: Estimated Creatinine Clearance: 22.6 mL/min (A) (by C-G formula based on SCr of 3.05 mg/dL (H)). Liver Function Tests: Recent Labs  Lab 10/18/17 0357 10/18/17 0646 10/21/17 0608 10/23/17 0354  AST 141* 103* 59* 94*  ALT 95* 69* 63*  71*  ALKPHOS 419* 314* 244* 309*  BILITOT 5.1* 4.0* 7.6* 6.9*  PROT 5.8* 4.3* 5.3* 5.5*  ALBUMIN 2.0* 1.5* 1.6* 1.6*   No results for input(s): LIPASE, AMYLASE in the last 168 hours. Recent Labs  Lab 10/18/17 0624 10/21/17 0608  AMMONIA 58* 10   Coagulation Profile: Recent Labs  Lab 10/18/17 0646 10/21/17 0608 10/23/17 0354  INR 2.11 2.36 1.84   Cardiac Enzymes: No results for input(s): CKTOTAL, CKMB, CKMBINDEX, TROPONINI in the last 168 hours. BNP (last 3 results) No results for input(s): PROBNP in the last 8760 hours. HbA1C: No results for input(s): HGBA1C in the last 72 hours. CBG: Recent Labs  Lab 10/21/17 2124 10/22/17 1702 10/22/17 2126 10/23/17 0514 10/23/17 0805  GLUCAP 184* 211* 195* 174* 186*   Lipid Profile: No results for input(s): CHOL, HDL, LDLCALC, TRIG, CHOLHDL, LDLDIRECT in the last 72 hours. Thyroid Function Tests: No results for input(s): TSH, T4TOTAL, FREET4, T3FREE, THYROIDAB in the last 72 hours. Anemia Panel: No results for input(s): VITAMINB12, FOLATE, FERRITIN, TIBC, IRON,  RETICCTPCT in the last 72 hours. Urine analysis:    Component Value Date/Time   COLORURINE AMBER (A) 10/18/2017 0406   APPEARANCEUR HAZY (A) 10/18/2017 0406   LABSPEC 1.017 10/18/2017 0406   PHURINE 5.0 10/18/2017 0406   GLUCOSEU NEGATIVE 10/18/2017 0406   HGBUR NEGATIVE 10/18/2017 0406   BILIRUBINUR NEGATIVE 10/18/2017 0406   KETONESUR NEGATIVE 10/18/2017 0406   PROTEINUR NEGATIVE 10/18/2017 0406   NITRITE NEGATIVE 10/18/2017 0406   LEUKOCYTESUR NEGATIVE 10/18/2017 0406   Sepsis Labs: Invalid input(s): PROCALCITONIN, LACTICIDVEN  Recent Results (from the past 240 hour(s))  Blood Culture (routine x 2)     Status: None   Collection Time: 10/18/17  4:00 AM  Result Value Ref Range Status   Specimen Description BLOOD RIGHT ARM  Final   Special Requests   Final    BOTTLES DRAWN AEROBIC ONLY Blood Culture adequate volume   Culture NO GROWTH 5 DAYS  Final    Report Status 10/23/2017 FINAL  Final  Blood Culture (routine x 2)     Status: None   Collection Time: 10/18/17  4:22 AM  Result Value Ref Range Status   Specimen Description BLOOD RIGHT ARM  Final   Special Requests   Final    BOTTLES DRAWN AEROBIC ONLY Blood Culture adequate volume   Culture NO GROWTH 5 DAYS  Final   Report Status 10/23/2017 FINAL  Final  MRSA PCR Screening     Status: None   Collection Time: 10/18/17 12:24 PM  Result Value Ref Range Status   MRSA by PCR NEGATIVE NEGATIVE Final    Comment:        The GeneXpert MRSA Assay (FDA approved for NASAL specimens only), is one component of a comprehensive MRSA colonization surveillance program. It is not intended to diagnose MRSA infection nor to guide or monitor treatment for MRSA infections.   MRSA PCR Screening     Status: None   Collection Time: 10/23/17  3:08 AM  Result Value Ref Range Status   MRSA by PCR NEGATIVE NEGATIVE Final    Comment:        The GeneXpert MRSA Assay (FDA approved for NASAL specimens only), is one component of a comprehensive MRSA colonization surveillance program. It is not intended to diagnose MRSA infection nor to guide or monitor treatment for MRSA infections.       Radiology Studies: Dg Chest Port 1 View  Result Date: 10/22/2017 CLINICAL DATA:  Shortness of breath. EXAM: PORTABLE CHEST 1 VIEW COMPARISON:  One-view chest x-ray 10/18/2017. FINDINGS: Heart size is normal. A right IJ catheter is stable. There lung volumes are slightly improved. Pulmonary vascular congestion remains. Atelectasis or scarring is stable at the left base. IMPRESSION: 1. Slight improved aeration without significant change in mild pulmonary vascular congestion. 2. Scarring or atelectasis at the left base is stable. Electronically Signed   By: Marin Roberts M.D.   On: 10/22/2017 07:42     Scheduled Meds: . aspirin  81 mg Oral Daily  . calcium carbonate  1 tablet Oral BID  . furosemide  20 mg  Intravenous Q12H  . haloperidol  0.5 mg Oral QHS  . heparin  5,000 Units Subcutaneous Q8H  . hydrocortisone sod succinate (SOLU-CORTEF) inj  50 mg Intravenous Q12H  . insulin aspart  0-15 Units Subcutaneous TID WC  . lactulose  30 g Oral BID  . levothyroxine  125 mcg Oral QAC breakfast  . lidocaine  1 patch Transdermal Q24H  . mouth rinse  15  mL Mouth Rinse BID  . pantoprazole (PROTONIX) IV  40 mg Intravenous Daily  . rifaximin  550 mg Oral BID   Continuous Infusions: . sodium chloride 10 mL/hr at 10/21/17 0600  . meropenem (MERREM) IV Stopped (10/23/17 0454)    Pamella Pertostin Tahje Borawski, MD, PhD [facility] Pager [phone]  If 7PM-7AM, please contact night-coverage [email or web address] Password [email or web address] 10/23/2017, 11:33 AM

## 2017-10-23 NOTE — Care Management Note (Signed)
Case Management Note  Patient Details  Name: Celesta GentileKacie S Dollar MRN: [id number] Date of Birth: 1946-01-11  Subjective/Objective:  From home with spouse,  Presents with septic shock, cellulitis of lower ext, NASH.  Per pt eval rec SNF vs 24 support at home.  NCM tried contacting spouse, did not get an answer.                  Action/Plan: NCM will follow along with CSW for dc needs.   Expected Discharge Date:                  Expected Discharge Plan:     In-House Referral:     Discharge planning Services  CM Consult  Post Acute Care Choice:    Choice offered to:     DME Arranged:    DME Agency:     HH Arranged:    HH Agency:     Status of Service:  In process, will continue to follow  If discussed at Long Length of Stay Meetings, dates discussed:    Additional Comments:  Leone Havenaylor, Arilynn Blakeney Clinton, RN 10/23/2017, 4:23 PM

## 2017-10-23 NOTE — Progress Notes (Signed)
Patient transferred to [facility] 08 with her belongings. Report given to [place], Charity fundraiserN. Husband Fredderick PhenixMeeks James called and informed about patient being transferred to another unit.   Viveka Wilmeth, RN

## 2017-10-23 NOTE — Progress Notes (Signed)
Initial Nutrition Assessment  DOCUMENTATION CODES:   Morbid obesity  INTERVENTION:   -30 ml Prostat TID, each supplement provides 100 kcals and 15 grams protein -MVI daily  NUTRITION DIAGNOSIS:   Inadequate oral intake related to lethargy/confusion as evidenced by meal completion < 50%.  GOAL:   Patient will meet greater than or equal to 90% of their needs  MONITOR:   PO intake, Supplement acceptance, Labs, Weight trends, Skin, I & O's  REASON FOR ASSESSMENT:   Low Braden    ASSESSMENT:   71 year old with nonalcoholic cirrhosis, admitted 12/8 with septic shock-presumed source is being UTI versus cellulitis.  Pt admitted with septic shock.   Pt minimally arousable at time of visit; unable to fully participate in hx. Noted meal tray at bedside was untouched.   Pt with variable intake; PO: 5-75% (averaging about 40% meal completion). Suspect poor oral intake related to AMS.   Reviewed wt hx; noted wt increase over the past year, which may partly be related to edema.   Reviewed CWOCN note from 10/18/17; pt with multiple skin tears, blisters, and partial and full thickness wounds.  Palliative care team following for goals of care; noted family meeting scheduled for this afternoon.   Labs reviewed: CBGS: 174-186 (inpatient orders for glycemic control are  0-15 units insulin aspart TID with meals).  NUTRITION - FOCUSED PHYSICAL EXAM:    Most Recent Value  Orbital Region  No depletion  Upper Arm Region  No depletion  Thoracic and Lumbar Region  No depletion  Buccal Region  No depletion  Temple Region  Mild depletion  Clavicle Bone Region  No depletion  Clavicle and Acromion Bone Region  No depletion  Scapular Bone Region  No depletion  Dorsal Hand  No depletion  Patellar Region  No depletion  Anterior Thigh Region  No depletion  Posterior Calf Region  No depletion  Edema (RD Assessment)  Moderate  Hair  Reviewed  Eyes  Reviewed  Mouth  Reviewed  Skin  Reviewed   Nails  Reviewed       Diet Order:  Diet Heart Room service appropriate? Yes; Fluid consistency: Thin  EDUCATION NEEDS:   No education needs have been identified at this time  Skin:  Skin Assessment: Skin Integrity Issues: Skin Integrity Issues:: Other (Comment) Other: full thickness wound: rt foot; partial thickness wounds: lt heel, rt shin, rt knee, and toes  Last BM:  10/22/17  Height:   Ht Readings from Last 1 Encounters:  10/21/17 5\' 6"  (1.676 m)    Weight:   Wt Readings from Last 1 Encounters:  10/23/17 263 lb 14.3 oz (119.7 kg)    Ideal Body Weight:  59.1 kg  BMI:  Body mass index is 42.59 kg/m.  Estimated Nutritional Needs:   Kcal:  1600-1800  Protein:  100-115 grams  Fluid:  > 1.6 L    Alisah Grandberry A. Mayford KnifeWilliams, RD, LDN, CDE Pager: [phone] After hours Pager: [phone]

## 2017-10-23 NOTE — Progress Notes (Signed)
Pt rectal temp 95.5. continuing bear hugger at 36 degrees C.

## 2017-10-23 NOTE — Progress Notes (Signed)
CRITICAL VALUE ALERT  Critical Value: Lactic acid 2.8  Date & Time Notied: 10/23/17 at 0500  Provider Notified: Craige CottaKirby  Orders Received/Actions taken: No new orders

## 2017-10-23 NOTE — Progress Notes (Signed)
Performed an additional full skin assessment at shift change with day RN Lurena Joinerebecca and with MD at bedside.

## 2017-10-23 NOTE — Progress Notes (Signed)
New orders received, will transfer the patient.  Laiah Pouncey, RN

## 2017-10-23 NOTE — Progress Notes (Signed)
Pt rectal temp 95.4 at 0530, bear hugger now at 43 degrees C.

## 2017-10-23 NOTE — Progress Notes (Signed)
Pt transferred onto the unit, pt rectal temp on arrival 95.6, bear hugger initiated at 36 degrees C.

## 2017-10-24 DIAGNOSIS — R401 Stupor: Secondary | ICD-10-CM

## 2017-10-24 LAB — GLUCOSE, CAPILLARY
GLUCOSE-CAPILLARY: 184 mg/dL — AB (ref 65–99)
GLUCOSE-CAPILLARY: 196 mg/dL — AB (ref 65–99)
Glucose-Capillary: 195 mg/dL — ABNORMAL HIGH (ref 65–99)
Glucose-Capillary: 198 mg/dL — ABNORMAL HIGH (ref 65–99)

## 2017-10-24 LAB — COMPREHENSIVE METABOLIC PANEL
ALBUMIN: 1.6 g/dL — AB (ref 3.5–5.0)
ALT: 88 U/L — AB (ref 14–54)
AST: 154 U/L — AB (ref 15–41)
Alkaline Phosphatase: 328 U/L — ABNORMAL HIGH (ref 38–126)
Anion gap: 10 (ref 5–15)
BUN: 102 mg/dL — AB (ref 6–20)
CHLORIDE: 105 mmol/L (ref 101–111)
CO2: 21 mmol/L — AB (ref 22–32)
CREATININE: 3.49 mg/dL — AB (ref 0.44–1.00)
Calcium: 8.7 mg/dL — ABNORMAL LOW (ref 8.9–10.3)
GFR calc Af Amer: 14 mL/min — ABNORMAL LOW (ref >60)
GFR calc non Af Amer: 12 mL/min — ABNORMAL LOW (ref >60)
GLUCOSE: 229 mg/dL — AB (ref 65–99)
POTASSIUM: 5.5 mmol/L — AB (ref 3.5–5.1)
Sodium: 136 mmol/L (ref 135–145)
Total Bilirubin: 7.1 mg/dL — ABNORMAL HIGH (ref 0.3–1.2)
Total Protein: 5.3 g/dL — ABNORMAL LOW (ref 6.5–8.1)

## 2017-10-24 LAB — CBC
HEMATOCRIT: 33 % — AB (ref 36.0–46.0)
Hemoglobin: 10.8 g/dL — ABNORMAL LOW (ref 12.0–15.0)
MCH: 33.3 pg (ref 26.0–34.0)
MCHC: 32.7 g/dL (ref 30.0–36.0)
MCV: 101.9 fL — AB (ref 78.0–100.0)
PLATELETS: 92 10*3/uL — AB (ref 150–400)
RBC: 3.24 MIL/uL — ABNORMAL LOW (ref 3.87–5.11)
RDW: 19 % — AB (ref 11.5–15.5)
WBC: 13.3 10*3/uL — AB (ref 4.0–10.5)

## 2017-10-24 LAB — AMMONIA: Ammonia: 16 umol/L (ref 9–35)

## 2017-10-24 LAB — PROTIME-INR
INR: 1.99
Prothrombin Time: 22.4 seconds — ABNORMAL HIGH (ref 11.4–15.2)

## 2017-10-24 MED ORDER — PANTOPRAZOLE SODIUM 40 MG IV SOLR
40.0000 mg | INTRAVENOUS | Status: DC
  Administered 2017-10-25 – 2017-10-26 (×2): 40 mg via INTRAVENOUS
  Filled 2017-10-24 (×2): qty 40

## 2017-10-24 MED ORDER — FUROSEMIDE 10 MG/ML IJ SOLN
80.0000 mg | Freq: Two times a day (BID) | INTRAMUSCULAR | Status: DC
  Administered 2017-10-24 – 2017-10-25 (×2): 80 mg via INTRAVENOUS
  Filled 2017-10-24 (×2): qty 8

## 2017-10-24 MED ORDER — PANTOPRAZOLE SODIUM 40 MG PO TBEC: 40.0000 mg | DELAYED_RELEASE_TABLET | Freq: Every day | ORAL | Status: DC

## 2017-10-24 MED ORDER — ALBUMIN HUMAN 25 % IV SOLN
50.0000 g | Freq: Once | INTRAVENOUS | Status: AC
  Administered 2017-10-24: 50 g via INTRAVENOUS
  Filled 2017-10-24: qty 50

## 2017-10-24 MED ORDER — OCTREOTIDE ACETATE 100 MCG/ML IJ SOLN
100.0000 ug | Freq: Three times a day (TID) | INTRAMUSCULAR | Status: DC
  Administered 2017-10-24 – 2017-10-27 (×9): 100 ug via SUBCUTANEOUS
  Filled 2017-10-24 (×10): qty 1

## 2017-10-24 NOTE — Progress Notes (Signed)
Pt temp 95.9, I turned the bear hugger back up to 43 degrees C

## 2017-10-24 NOTE — Progress Notes (Addendum)
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  Of note no organisms were specified during that hospitalization.  She was discharged to home under the care of her husband, apparently was doing okay, eating, getting about in the house using a walker, had baseline intermittent confusion.  Over the past 1-2 days she began to have worsening confusion and worsening discoloration, redness, and heat over the lower extremities.  Her husband called EMS given concerns about her worsening lower extremity wounds.  In the emergency room she was noted to be confused,She had too numerous to count lesions lesions/skin tears/ulcerations all over her body the worst over her lower extremities.  She was afebrile, but noted to be hypotensive with blood pressure in the 80s she was started on norepinephrine and admitted to the ICU.  Patient has been weaned off of vasopressors, however despite antibiotics and aggressive treatment she remains obtunded for most part of the day, and she has progressive renal failure.  Palliative consulted and ongoing goals of care discussions are underway  Assessment & Plan: Active Problems:   Septic shock (HCC)   Cellulitis of lower extremity   Palliative care by specialist   DNR (do not resuscitate)   NASH (nonalcoholic steatohepatitis)  Severe sepsis septic shock in the setting of what appears to be diffuse cellulitis involving at least the both lower extremities possibly more extensive. -Also consider urinary tract source >> UA negative  12/8 -She is off pressors since 12/11, was on stress dose steroids and these were discontinued on 12/11 as well, however steroids were resumed on 12/12 due to persistent hypothermia -Blood pressure stable, now off pressors -Continue meropenem, today day 8  Acute on chronic renal failure; CRI stage II-III at baseline; with baseline creatinine at around 1.4 Non-anion gap metabolic acidosis -Patient has progressive renal failure and she is significantly fluid overloaded with anasarca, she has been getting low-dose Lasix for the past couple of days with worsening of her creatinine.  Discussed with nephrology Dr. 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  She is normotensive.  Goals of care -Discussed extensively with patient's husband Fayrene FearingJames, in person as well as over the phone, about her poor prognosis.  She was admitted with septic shock, and had underlying advanced liver disease as well as chronic kidney disease, she seems to have progressive decline despite adequate treatment.  Her kidney function is getting worse and her mental status is getting worse as well.  She is becoming more uremic.  She is DNR/DNI.  Palliative has been following as well, and if she is not responding to last measures with octreotide, albumin, probably to transition towards comfort.  If she is not responding to treatment for the next day or so, he has been exploring residential hospice options in [place]  History of diastolic heart failure with anasarca -Fluid resuscitated in setting of septic shock, blood pressure today stable -IV Lasix as above  Acute encephalopathy superimposed on probably what has intermittent hepatic encephalopathy -Anticipate improvement as we reverse underlying metabolic and infectious contributors -Continue lactulose  and rifaximin -Minimize sedating medications as able  Anemia of chronic  disease -Hemoglobin stable, no bleeding  Thrombocytopenia and coagulopathy in setting of cirrhosis -s/p vitamin K, monitor INR  Severe protein calorie malnutrition superimposed on cirrhosis in the setting of NASH  Hypothyroidism -Continue Synthroid as ordered  Diabetes  -controlled, most recent hemoglobin A1c 5.6  Plan -Continue sliding scale insulin as ordered   DVT prophylaxis: heparin Code Status: DNR Family Communication: d/w husband James Disposition Plan: TBD  Consultants:   PCCM  Palliative   Procedures:   None   Antimicrobials:  Meropenem 12/8 >>  Vancomycin 12/8 >> 12/11   Subjective: -Lethargic, poorly responsive  Objective: Vitals:   10/24/17 0300 10/24/17 0430 10/24/17 0758 10/24/17 1129  BP:  109/74 122/73 129/61  Pulse:  75 82 78  Resp:  19 14 11   Temp:  (!) 95.7 F (35.4 C) (!) 96.2 F (35.7 C) (!) 96.2 F (35.7 C)  TempSrc:  Rectal Rectal Rectal  SpO2:  100% 99% 100%  Weight: 118.4 kg (261 lb 0.4 oz)     Height:        Intake/Output Summary (Last 24 hours) at 10/24/2017 1335 Last data filed at 10/24/2017 0508 Gross per 24 hour  Intake 520 ml  Output 400 ml  Net 120 ml   Filed Weights   10/22/17 0335 10/23/17 0315 10/24/17 0300  Weight: 120.9 kg (266 lb 8.6 oz) 119.7 kg (263 lb 14.3 oz) 118.4 kg (261 lb 0.4 oz)    Examination:  Constitutional: NAD, obtunded Eyes:  lids and conjunctivae normal ENMT: Mucous membranes are dry Respiratory: distant breath sounds, no crackles Cardiovascular: Regular rate and rhythm, no murmurs / rubs / gallops.  2+ pitting LE edema. Generalized anasarca Abdomen: no tenderness. Bowel sounds positive.  Neurologic: non focal   Data Reviewed: I have independently reviewed following labs and imaging studies   CBC: Recent Labs  Lab 10/18/17 0357 10/18/17 0646  10/19/17 0455 10/20/17 0521 10/21/17 0608 10/23/17 0354 10/24/17 0755  WBC 3.2* 2.5*   < > 9.6 15.6* 20.9* 21.7* 13.3*    NEUTROABS 2.5 1.7  --   --   --   --   --   --   HGB 11.9* 9.3*   < > 10.3* 9.9* 10.5* 10.8* 10.8*  HCT 38.6 29.7*   < > 32.7* 31.3* 32.6* 32.5* 33.0*  MCV 108.4* 107.6*   < > 106.5* 105.0* 103.5* 101.9* 101.9*  PLT 112* 83*   < > 114* 107* 111* 107* 92*   < > = values in this interval not displayed.   Basic Metabolic Panel: Recent Labs  Lab 10/18/17 1653 10/19/17 0455 10/20/17 0521 10/21/17 0608 10/22/17 0623 10/23/17 0354 10/24/17 0755  NA  --  137 137 137 133* 135 136  K  --  5.6* 4.8 4.8 5.5* 4.7 5.5*  CL  --  104 105 105 102 105 105  CO2  --  21* 23 22 22  21* 21*  GLUCOSE  --  206* 157* 118* 147* 199* 229*  BUN  --  51* 64* 74* 85* 89* 102*  CREATININE 2.23* 2.53* 2.65* 2.84* 2.84* 3.05* 3.49*  CALCIUM  --  8.5* 8.2* 8.1* 8.1* 8.4* 8.7*  MG 2.0 2.1 2.1  --  2.3 2.3  --   PHOS 5.5* 6.2* 5.9*  --  6.4* 6.6*  --    GFR: Estimated Creatinine Clearance: 19.6 mL/min (A) (by C-G formula based on SCr of 3.49 mg/dL (H)). Liver Function  Tests: Recent Labs  Lab 10/18/17 0357 10/18/17 0646 10/21/17 0608 10/23/17 0354 10/24/17 0755  AST 141* 103* 59* 94* 154*  ALT 95* 69* 63* 71* 88*  ALKPHOS 419* 314* 244* 309* 328*  BILITOT 5.1* 4.0* 7.6* 6.9* 7.1*  PROT 5.8* 4.3* 5.3* 5.5* 5.3*  ALBUMIN 2.0* 1.5* 1.6* 1.6* 1.6*   No results for input(s): LIPASE, AMYLASE in the last 168 hours. Recent Labs  Lab 10/18/17 0624 10/21/17 0608 10/24/17 0448  AMMONIA 58* 10 16   Coagulation Profile: Recent Labs  Lab 10/18/17 0646 10/21/17 0608 10/23/17 0354 10/24/17 0755  INR 2.11 2.36 1.84 1.99   Cardiac Enzymes: No results for input(s): CKTOTAL, CKMB, CKMBINDEX, TROPONINI in the last 168 hours. BNP (last 3 results) No results for input(s): PROBNP in the last 8760 hours. HbA1C: No results for input(s): HGBA1C in the last 72 hours. CBG: Recent Labs  Lab 10/23/17 1724 10/23/17 2021 10/23/17 2214 10/24/17 0759 10/24/17 1130  GLUCAP 188* 217* 213* 195* 184*   Lipid  Profile: No results for input(s): CHOL, HDL, LDLCALC, TRIG, CHOLHDL, LDLDIRECT in the last 72 hours. Thyroid Function Tests: No results for input(s): TSH, T4TOTAL, FREET4, T3FREE, THYROIDAB in the last 72 hours. Anemia Panel: No results for input(s): VITAMINB12, FOLATE, FERRITIN, TIBC, IRON, RETICCTPCT in the last 72 hours. Urine analysis:    Component Value Date/Time   COLORURINE AMBER (A) 10/18/2017 0406   APPEARANCEUR HAZY (A) 10/18/2017 0406   LABSPEC 1.017 10/18/2017 0406   PHURINE 5.0 10/18/2017 0406   GLUCOSEU NEGATIVE 10/18/2017 0406   HGBUR NEGATIVE 10/18/2017 0406   BILIRUBINUR NEGATIVE 10/18/2017 0406   KETONESUR NEGATIVE 10/18/2017 0406   PROTEINUR NEGATIVE 10/18/2017 0406   NITRITE NEGATIVE 10/18/2017 0406   LEUKOCYTESUR NEGATIVE 10/18/2017 0406   Sepsis Labs: Invalid input(s): PROCALCITONIN, LACTICIDVEN  Recent Results (from the past 240 hour(s))  Blood Culture (routine x 2)     Status: None   Collection Time: 10/18/17  4:00 AM  Result Value Ref Range Status   Specimen Description BLOOD RIGHT ARM  Final   Special Requests   Final    BOTTLES DRAWN AEROBIC ONLY Blood Culture adequate volume   Culture NO GROWTH 5 DAYS  Final   Report Status 10/23/2017 FINAL  Final  Blood Culture (routine x 2)     Status: None   Collection Time: 10/18/17  4:22 AM  Result Value Ref Range Status   Specimen Description BLOOD RIGHT ARM  Final   Special Requests   Final    BOTTLES DRAWN AEROBIC ONLY Blood Culture adequate volume   Culture NO GROWTH 5 DAYS  Final   Report Status 10/23/2017 FINAL  Final  MRSA PCR Screening     Status: None   Collection Time: 10/18/17 12:24 PM  Result Value Ref Range Status   MRSA by PCR NEGATIVE NEGATIVE Final    Comment:        The GeneXpert MRSA Assay (FDA approved for NASAL specimens only), is one component of a comprehensive MRSA colonization surveillance program. It is not intended to diagnose MRSA infection nor to guide or monitor  treatment for MRSA infections.   Culture, blood (Routine X 2) w Reflex to ID Panel     Status: None (Preliminary result)   Collection Time: 10/22/17  6:30 AM  Result Value Ref Range Status   Specimen Description BLOOD LEFT ANTECUBITAL  Final   Special Requests   Final    BOTTLES DRAWN AEROBIC AND ANAEROBIC Blood Culture adequate volume  Culture NO GROWTH 2 DAYS  Final   Report Status PENDING  Incomplete  Culture, blood (Routine X 2) w Reflex to ID Panel     Status: None (Preliminary result)   Collection Time: 10/22/17  6:45 AM  Result Value Ref Range Status   Specimen Description BLOOD LEFT HAND  Final   Special Requests   Final    BOTTLES DRAWN AEROBIC AND ANAEROBIC Blood Culture adequate volume   Culture NO GROWTH 2 DAYS  Final   Report Status PENDING  Incomplete  MRSA PCR Screening     Status: None   Collection Time: 10/23/17  3:08 AM  Result Value Ref Range Status   MRSA by PCR NEGATIVE NEGATIVE Final    Comment:        The GeneXpert MRSA Assay (FDA approved for NASAL specimens only), is one component of a comprehensive MRSA colonization surveillance program. It is not intended to diagnose MRSA infection nor to guide or monitor treatment for MRSA infections.       Radiology Studies: No results found.   Scheduled Meds: . aspirin  81 mg Oral Daily  . calcium carbonate  1 tablet Oral BID  . feeding supplement (PRO-STAT SUGAR FREE 64)  30 mL Oral TID BM  . furosemide  80 mg Intravenous Q12H  . haloperidol  0.5 mg Oral QHS  . heparin  5,000 Units Subcutaneous Q8H  . hydrocortisone sod succinate (SOLU-CORTEF) inj  50 mg Intravenous Q12H  . insulin aspart  0-15 Units Subcutaneous TID WC  . lactulose  30 g Oral BID  . levothyroxine  125 mcg Oral QAC breakfast  . mouth rinse  15 mL Mouth Rinse BID  . multivitamin with minerals  1 tablet Oral Daily  . octreotide  100 mcg Subcutaneous Q8H  . [START ON 10/25/2017] pantoprazole (PROTONIX) IV  40 mg Intravenous Q24H  .  rifaximin  550 mg Oral BID   Continuous Infusions: . sodium chloride 10 mL/hr at 10/21/17 0600  . albumin human    . meropenem (MERREM) IV Stopped (10/24/17 1610)   Time spent: 35 minutes, more than 50% bedside and in direct discussions with patient's husband in person and over the phone  Pamella Pert, MD, PhD [facility] Pager [phone]  If 7PM-7AM, please contact night-coverage [email or web address] Password [email or web address] 10/24/2017, 1:35 PM

## 2017-10-24 NOTE — Plan of Care (Signed)
Pt condition appears to remain unstable, generalized weeping, AMS, generalized wounds are all consistent. Pt temp continues to be below normal limits even with consistent use of the bear hugger. Continued interdisciplinary care with palliative should be considered to help bring some relief and comfort for pt and family during the treatment process.

## 2017-10-24 NOTE — Progress Notes (Signed)
I received a call from [facility] around 2130 stating that the pt did not have any clothes on. When I was finished in another room I went into this pt's room and found them to be completely undressed with their leads, 02 sat monitor, prevlon boots, bear hugger and purewick completely removed and on the floor. After straightening the pt up and putting all equipment back on them I placed pt safety mitts on the pt to prevent any further removal of pertinent pt equipment.

## 2017-10-25 DIAGNOSIS — E039 Hypothyroidism, unspecified: Secondary | ICD-10-CM

## 2017-10-25 DIAGNOSIS — R600 Localized edema: Secondary | ICD-10-CM

## 2017-10-25 DIAGNOSIS — N049 Nephrotic syndrome with unspecified morphologic changes: Secondary | ICD-10-CM

## 2017-10-25 DIAGNOSIS — K7581 Nonalcoholic steatohepatitis (NASH): Secondary | ICD-10-CM

## 2017-10-25 DIAGNOSIS — Z66 Do not resuscitate: Secondary | ICD-10-CM

## 2017-10-25 LAB — COMPREHENSIVE METABOLIC PANEL
ALBUMIN: 2.2 g/dL — AB (ref 3.5–5.0)
ALT: 58 U/L — ABNORMAL HIGH (ref 14–54)
ANION GAP: 13 (ref 5–15)
AST: 82 U/L — AB (ref 15–41)
Alkaline Phosphatase: 248 U/L — ABNORMAL HIGH (ref 38–126)
BILIRUBIN TOTAL: 8.2 mg/dL — AB (ref 0.3–1.2)
BUN: 117 mg/dL — AB (ref 6–20)
CHLORIDE: 106 mmol/L (ref 101–111)
CO2: 20 mmol/L — ABNORMAL LOW (ref 22–32)
Calcium: 8.4 mg/dL — ABNORMAL LOW (ref 8.9–10.3)
Creatinine, Ser: 3.58 mg/dL — ABNORMAL HIGH (ref 0.44–1.00)
GFR calc Af Amer: 14 mL/min — ABNORMAL LOW (ref >60)
GFR calc non Af Amer: 12 mL/min — ABNORMAL LOW (ref >60)
GLUCOSE: 191 mg/dL — AB (ref 65–99)
POTASSIUM: 5.4 mmol/L — AB (ref 3.5–5.1)
Sodium: 139 mmol/L (ref 135–145)
TOTAL PROTEIN: 5.2 g/dL — AB (ref 6.5–8.1)

## 2017-10-25 LAB — CBC
HCT: 29.9 % — ABNORMAL LOW (ref 36.0–46.0)
Hemoglobin: 9.8 g/dL — ABNORMAL LOW (ref 12.0–15.0)
MCH: 33.6 pg (ref 26.0–34.0)
MCHC: 32.8 g/dL (ref 30.0–36.0)
MCV: 102.4 fL — ABNORMAL HIGH (ref 78.0–100.0)
PLATELETS: 77 10*3/uL — AB (ref 150–400)
RBC: 2.92 MIL/uL — ABNORMAL LOW (ref 3.87–5.11)
RDW: 19.2 % — AB (ref 11.5–15.5)
WBC: 11.9 10*3/uL — ABNORMAL HIGH (ref 4.0–10.5)

## 2017-10-25 LAB — GLUCOSE, CAPILLARY
GLUCOSE-CAPILLARY: 181 mg/dL — AB (ref 65–99)
GLUCOSE-CAPILLARY: 178 mg/dL — AB (ref 65–99)
GLUCOSE-CAPILLARY: 188 mg/dL — AB (ref 65–99)
GLUCOSE-CAPILLARY: 171 mg/dL — AB (ref 65–99)
Glucose-Capillary: 166 mg/dL — ABNORMAL HIGH (ref 65–99)

## 2017-10-25 LAB — BASIC METABOLIC PANEL
Anion gap: 12 (ref 5–15)
BUN: 114 mg/dL — ABNORMAL HIGH (ref 6–20)
CALCIUM: 8.7 mg/dL — AB (ref 8.9–10.3)
CO2: 21 mmol/L — AB (ref 22–32)
CREATININE: 3.59 mg/dL — AB (ref 0.44–1.00)
Chloride: 107 mmol/L (ref 101–111)
GFR calc Af Amer: 14 mL/min — ABNORMAL LOW (ref >60)
GFR calc non Af Amer: 12 mL/min — ABNORMAL LOW (ref >60)
GLUCOSE: 197 mg/dL — AB (ref 65–99)
Potassium: 5.6 mmol/L — ABNORMAL HIGH (ref 3.5–5.1)
Sodium: 140 mmol/L (ref 135–145)

## 2017-10-25 LAB — AMMONIA: Ammonia: 26 umol/L (ref 9–35)

## 2017-10-25 MED ORDER — SODIUM POLYSTYRENE SULFONATE 15 GM/60ML PO SUSP: 30.0000 g | Freq: Four times a day (QID) | ORAL | Status: DC

## 2017-10-25 MED ORDER — FUROSEMIDE 10 MG/ML IJ SOLN
120.0000 mg | Freq: Four times a day (QID) | INTRAVENOUS | Status: DC
  Administered 2017-10-25 – 2017-10-27 (×7): 120 mg via INTRAVENOUS
  Filled 2017-10-25 (×9): qty 12

## 2017-10-25 MED ORDER — SODIUM BICARBONATE 8.4 % IV SOLN
50.0000 meq | Freq: Once | INTRAVENOUS | Status: AC
  Administered 2017-10-25: 50 meq via INTRAVENOUS
  Filled 2017-10-25: qty 50

## 2017-10-25 MED ORDER — HYDROCORTISONE NA SUCCINATE PF 100 MG IJ SOLR
25.0000 mg | Freq: Two times a day (BID) | INTRAMUSCULAR | Status: DC
  Administered 2017-10-25 – 2017-10-26 (×2): 25 mg via INTRAVENOUS
  Filled 2017-10-25 (×2): qty 2

## 2017-10-25 MED ORDER — SODIUM CHLORIDE 0.9 % IV SOLN
1.0000 g | Freq: Once | INTRAVENOUS | Status: AC
  Administered 2017-10-25: 1 g via INTRAVENOUS
  Filled 2017-10-25: qty 10

## 2017-10-25 MED ORDER — ACETAMINOPHEN 325 MG PO TABS: 650.0000 mg | ORAL_TABLET | Freq: Four times a day (QID) | ORAL | Status: DC | PRN

## 2017-10-25 MED ORDER — LEVOTHYROXINE SODIUM 100 MCG IV SOLR
62.5000 ug | Freq: Every day | INTRAVENOUS | Status: DC
  Administered 2017-10-25: 6.25 ug via INTRAVENOUS
  Administered 2017-10-26: 62.5 ug via INTRAVENOUS
  Filled 2017-10-25 (×2): qty 5

## 2017-10-25 MED ORDER — SODIUM POLYSTYRENE SULFONATE 15 GM/60ML PO SUSP
60.0000 g | Freq: Four times a day (QID) | ORAL | Status: AC
  Administered 2017-10-25: 60 g via RECTAL

## 2017-10-25 MED ORDER — SODIUM POLYSTYRENE SULFONATE PO POWD
60.0000 g | Freq: Four times a day (QID) | ORAL | Status: DC
  Administered 2017-10-25: 60 g via RECTAL
  Filled 2017-10-25 (×2): qty 60

## 2017-10-25 MED ORDER — ACETAMINOPHEN 650 MG RE SUPP
650.0000 mg | Freq: Four times a day (QID) | RECTAL | Status: DC | PRN
Start: 2017-10-25 — End: 2017-10-27
  Administered 2017-10-25: 650 mg via RECTAL
  Filled 2017-10-25: qty 1

## 2017-10-25 MED ORDER — HALOPERIDOL LACTATE 5 MG/ML IJ SOLN
0.5000 mg | Freq: Every day | INTRAMUSCULAR | Status: DC
  Administered 2017-10-25: 0.5 mg via INTRAVENOUS
  Filled 2017-10-25: qty 1

## 2017-10-25 MED ORDER — ACETAMINOPHEN 160 MG/5ML PO SOLN: 650.0000 mg | Freq: Four times a day (QID) | ORAL | Status: DC | PRN

## 2017-10-25 NOTE — Progress Notes (Addendum)
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   She was discharged to home under the care of her husband and apparently was doing okay, eating, getting about in the house using a walker, had baseline intermittent confusion.   Over the past 1-2 days she began to have worsening confusion and worsening discoloration, redness, and heat over the lower extremities. Her husband called EMS given concerns about her worsening lower extremity wounds. In the emergency room she was noted to be confused. She had too numerous to count lesions lesions/skin tears/ulcerations all over her body the worst over her lower extremities. She was afebrile, but noted to be hypotensive with blood pressure in the 80s she was started on norepinephrine and admitted to the ICU.  Patient has been weaned off of vasopressors, however despite antibiotics and aggressive treatment she remains obtunded for most part of the day, and she has progressive renal failure.  Palliative consulted and ongoing goals of care discussions are underway.    Subjective: Awakens briefly. Appears confused.  Repeats answers to prior question about 4-5 times and does not answer my next questions. Most answers are inaccurate. ROS: no complaints of nausea, vomiting, constipation diarrhea, cough, dyspnea or dysuria. No other complaints.   Assessment & Plan:   Active Problems:   Septic shock - extensive cellulitis of body - UA negative - pressors stopped on 12/11 and stress dose steroids given (now being  continued for hypothermia on 12/12) - on Meropenem- day 9 - leukocytosis steadily improved from 20 >> 11 - wean Hydrocortisone to 25 mg Q 12 from 50 mg    Cellulitis of lower extremity - see above- improving  NASH Cirrhosis, 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 Hepatic encephalopathy- has baseline confusion at home as well and therefore may have dementia Mild chronic thrombocytopenia possibly from cirrhosis - cont on Lactulose and Rifaximin as tolerated - ammonia level normal since 12/11 - platelets being followed  AKI  - anasarca Metabolic acidosis - being treated as possibly hepatorenal - per Dr Charlean Sanfilippo conversation with nephrology, she is not a candidate for dialysis - baseline Cr 0.8 in 8/18- up to 1.8 in Nov - now fluid overloaded with BUN < 100 and Cr ~ 3.5 - on Lasix 80 mg BID- very little urine output- increase dose to 120 mg QID  - with a Cr of > 3, she needs much higher doses of Lasix to produce urine - palliative care discussion also taking place  Hyperkalemia - is not taking oral mediations today- try rectal Kayexalate enemas x 2   Grade 2 dCHF - last ECHO in 8/18- CXR on 12/12 only showed mild pulm vascular congestion    Palliative care by specialist   DNR (do not resuscitate)  DVT prophylaxis: Heparin Code Status: DNR Family Communication:  Disposition Plan: cont to follow in SDU Consultants:   PCCM  Palliative care Procedures:   none Antimicrobials:  Anti-infectives (From admission, onward)   Start     Dose/Rate Route Frequency Ordered Stop   10/21/17 0456  vancomycin (VANCOCIN) 1,750 mg in sodium chloride 0.9 % 500 mL  IVPB  Status:  Discontinued     1,750 mg 250 mL/hr over 120 Minutes Intravenous Every 48 hours 10/19/17 1020 10/21/17 1351   10/19/17 0400  vancomycin (VANCOCIN) 1,250 mg in sodium chloride 0.9 % 250 mL IVPB  Status:  Discontinued     1,250 mg 166.7 mL/hr over 90 Minutes Intravenous Every 24 hours 10/18/17 1031 10/19/17 1020    10/18/17 2000  rifaximin (XIFAXAN) tablet 550 mg     550 mg Oral 2 times daily 10/18/17 1541     10/18/17 1600  meropenem (MERREM) 1 g in sodium chloride 0.9 % 100 mL IVPB     1 g 200 mL/hr over 30 Minutes Intravenous Every 12 hours 10/18/17 1546     10/18/17 1300  aztreonam (AZACTAM) 1 g in dextrose 5 % 50 mL IVPB  Status:  Discontinued     1 g 100 mL/hr over 30 Minutes Intravenous Every 8 hours 10/18/17 0835 10/18/17 1541   10/18/17 1200  metroNIDAZOLE (FLAGYL) IVPB 500 mg  Status:  Discontinued     500 mg 100 mL/hr over 60 Minutes Intravenous Every 8 hours 10/18/17 0835 10/18/17 1541   10/18/17 1200  vancomycin (VANCOCIN) 1,500 mg in sodium chloride 0.9 % 500 mL IVPB  Status:  Discontinued     1,500 mg 250 mL/hr over 120 Minutes Intravenous Every 48 hours 10/18/17 0835 10/18/17 1031   10/18/17 0415  aztreonam (AZACTAM) 2 g in dextrose 5 % 50 mL IVPB     2 g 100 mL/hr over 30 Minutes Intravenous  Once 10/18/17 0412 10/18/17 0532   10/18/17 0415  metroNIDAZOLE (FLAGYL) IVPB 500 mg     500 mg 100 mL/hr over 60 Minutes Intravenous  Once 10/18/17 0412 10/18/17 0547   10/18/17 0415  vancomycin (VANCOCIN) IVPB 1000 mg/200 mL premix     1,000 mg 200 mL/hr over 60 Minutes Intravenous  Once 10/18/17 0412 10/18/17 0555       Objective: Vitals:   10/24/17 2000 10/25/17 0000 10/25/17 0411 10/25/17 0800  BP:      Pulse:      Resp:      Temp: (!) 97.3 F (36.3 C) (!) 97.5 F (36.4 C) 97.7 F (36.5 C) 98.1 F (36.7 C)  TempSrc:   Axillary Axillary  SpO2:      Weight:   116.3 kg (256 lb 6.3 oz)   Height:        Intake/Output Summary (Last 24 hours) at 10/25/2017 0842 Last data filed at 10/25/2017 0413 Gross per 24 hour  Intake 120 ml  Output 325 ml  Net -205 ml   Filed Weights   10/23/17 0315 10/24/17 0300 10/25/17 0411  Weight: 119.7 kg (263 lb 14.3 oz) 118.4 kg (261 lb 0.4 oz) 116.3 kg (256 lb 6.3 oz)    Examination: General exam: Appears comfortable  HEENT: PERRLA, oral  mucosa very dry, no sclera icterus or thrush Respiratory system: Clear to auscultation. Respiratory effort normal. Cardiovascular system: S1 & S2 heard, RRR.  No murmurs  Gastrointestinal system: Abdomen soft, non-tender, nondistended. Normal bowel sound. No organomegaly Extremities: No cyanosis, clubbing - extensive edema Skin: + abrasions/ shallow ulcers extensively Psychiatry:  Quite confused and sleepy    Data Reviewed: I have personally reviewed following labs and imaging studies  CBC: Recent Labs  Lab 10/19/17 0455 10/20/17 0521 10/21/17 0608 10/23/17 0354 10/24/17 0755  WBC 9.6 15.6* 20.9* 21.7* 13.3*  HGB 10.3* 9.9* 10.5* 10.8* 10.8*  HCT 32.7* 31.3* 32.6* 32.5* 33.0*  MCV 106.5* 105.0* 103.5* 101.9* 101.9*  PLT 114* 107* 111* 107* 92*   Basic Metabolic Panel: Recent Labs  Lab 10/18/17 1653  10/19/17 0455 10/20/17 0521 10/21/17 0608 10/22/17 0623 10/23/17 0354 10/24/17 0755  NA  --    < > 137 137 137 133* 135 136  K  --    < > 5.6* 4.8 4.8 5.5* 4.7 5.5*  CL  --    < > 104 105 105 102 105 105  CO2  --    < > 21* 23 22 22  21* 21*  GLUCOSE  --    < > 206* 157* 118* 147* 199* 229*  BUN  --    < > 51* 64* 74* 85* 89* 102*  CREATININE 2.23*  --  2.53* 2.65* 2.84* 2.84* 3.05* 3.49*  CALCIUM  --    < > 8.5* 8.2* 8.1* 8.1* 8.4* 8.7*  MG 2.0  --  2.1 2.1  --  2.3 2.3  --   PHOS 5.5*  --  6.2* 5.9*  --  6.4* 6.6*  --    < > = values in this interval not displayed.   GFR: Estimated Creatinine Clearance: 19.4 mL/min (A) (by C-G formula based on SCr of 3.49 mg/dL (H)). Liver Function Tests: Recent Labs  Lab 10/21/17 1610 10/23/17 0354 10/24/17 0755  AST 59* 94* 154*  ALT 63* 71* 88*  ALKPHOS 244* 309* 328*  BILITOT 7.6* 6.9* 7.1*  PROT 5.3* 5.5* 5.3*  ALBUMIN 1.6* 1.6* 1.6*   No results for input(s): LIPASE, AMYLASE in the last 168 hours. Recent Labs  Lab 10/21/17 0608 10/24/17 0448  AMMONIA 10 16   Coagulation Profile: Recent Labs  Lab 10/21/17 0608  10/23/17 0354 10/24/17 0755  INR 2.36 1.84 1.99   Cardiac Enzymes: No results for input(s): CKTOTAL, CKMB, CKMBINDEX, TROPONINI in the last 168 hours. BNP (last 3 results) No results for input(s): PROBNP in the last 8760 hours. HbA1C: No results for input(s): HGBA1C in the last 72 hours. CBG: Recent Labs  Lab 10/24/17 0759 10/24/17 1130 10/24/17 1608 10/24/17 2026 10/25/17 0837  GLUCAP 195* 184* 198* 196* 181*   Lipid Profile: No results for input(s): CHOL, HDL, LDLCALC, TRIG, CHOLHDL, LDLDIRECT in the last 72 hours. Thyroid Function Tests: No results for input(s): TSH, T4TOTAL, FREET4, T3FREE, THYROIDAB in the last 72 hours. Anemia Panel: No results for input(s): VITAMINB12, FOLATE, FERRITIN, TIBC, IRON, RETICCTPCT in the last 72 hours. Urine analysis:    Component Value Date/Time   COLORURINE AMBER (A) 10/18/2017 0406   APPEARANCEUR HAZY (A) 10/18/2017 0406   LABSPEC 1.017 10/18/2017 0406   PHURINE 5.0 10/18/2017 0406   GLUCOSEU NEGATIVE 10/18/2017 0406   HGBUR NEGATIVE 10/18/2017 0406   BILIRUBINUR NEGATIVE 10/18/2017 0406   KETONESUR NEGATIVE 10/18/2017 0406   PROTEINUR NEGATIVE 10/18/2017 0406   NITRITE NEGATIVE 10/18/2017 0406   LEUKOCYTESUR NEGATIVE 10/18/2017 0406   Sepsis Labs: @LABRCNTIP (procalcitonin:4,lacticidven:4) ) Recent Results (from the past 240 hour(s))  Blood Culture (routine x 2)     Status: None   Collection Time: 10/18/17  4:00 AM  Result Value Ref Range Status   Specimen Description BLOOD RIGHT ARM  Final   Special Requests   Final    BOTTLES DRAWN AEROBIC ONLY Blood Culture adequate volume   Culture NO GROWTH 5 DAYS  Final   Report Status 10/23/2017 FINAL  Final  Blood Culture (routine x 2)     Status: None   Collection Time: 10/18/17  4:22 AM  Result Value Ref Range Status   Specimen Description BLOOD RIGHT ARM  Final   Special Requests   Final    BOTTLES DRAWN AEROBIC ONLY Blood Culture adequate volume   Culture NO GROWTH 5 DAYS   Final   Report Status 10/23/2017 FINAL  Final  MRSA PCR Screening     Status: None   Collection Time: 10/18/17 12:24 PM  Result Value Ref Range Status   MRSA by PCR NEGATIVE NEGATIVE Final    Comment:        The GeneXpert MRSA Assay (FDA approved for NASAL specimens only), is one component of a comprehensive MRSA colonization surveillance program. It is not intended to diagnose MRSA infection nor to guide or monitor treatment for MRSA infections.   Culture, blood (Routine X 2) w Reflex to ID Panel     Status: None (Preliminary result)   Collection Time: 10/22/17  6:30 AM  Result Value Ref Range Status   Specimen Description BLOOD LEFT ANTECUBITAL  Final   Special Requests   Final    BOTTLES DRAWN AEROBIC AND ANAEROBIC Blood Culture adequate volume   Culture NO GROWTH 2 DAYS  Final   Report Status PENDING  Incomplete  Culture, blood (Routine X 2) w Reflex to ID Panel     Status: None (Preliminary result)   Collection Time: 10/22/17  6:45 AM  Result Value Ref Range Status   Specimen Description BLOOD LEFT HAND  Final   Special Requests   Final    BOTTLES DRAWN AEROBIC AND ANAEROBIC Blood Culture adequate volume   Culture NO GROWTH 2 DAYS  Final   Report Status PENDING  Incomplete  MRSA PCR Screening     Status: None   Collection Time: 10/23/17  3:08 AM  Result Value Ref Range Status   MRSA by PCR NEGATIVE NEGATIVE Final    Comment:        The GeneXpert MRSA Assay (FDA approved for NASAL specimens only), is one component of a comprehensive MRSA colonization surveillance program. It is not intended to diagnose MRSA infection nor to guide or monitor treatment for MRSA infections.          Radiology Studies: No results found.    Scheduled Meds: . aspirin  81 mg Oral Daily  . calcium carbonate  1 tablet Oral BID  . feeding supplement (PRO-STAT SUGAR FREE 64)  30 mL Oral TID BM  . furosemide  80 mg Intravenous BID  . haloperidol lactate  0.5 mg Intravenous QHS    . heparin  5,000 Units Subcutaneous Q8H  . hydrocortisone sod succinate (SOLU-CORTEF) inj  50 mg Intravenous Q12H  . insulin aspart  0-15 Units Subcutaneous TID WC  . lactulose  30 g Oral BID  . levothyroxine  62.5 mcg Intravenous QAC breakfast  . mouth rinse  15 mL Mouth Rinse BID  . multivitamin with minerals  1 tablet Oral Daily  . octreotide  100 mcg Subcutaneous Q8H  . pantoprazole (PROTONIX) IV  40 mg Intravenous Q24H  . rifaximin  550 mg Oral BID   Continuous Infusions: . sodium chloride 10 mL/hr at 10/21/17 0600  . meropenem (MERREM) IV Stopped (10/25/17 0424)     LOS: 7 days    Time spent in minutes: 40 min    Calvert CantorSaima Kashmere Staffa, MD [facility] Pager: [email or web address] Password [email or web address] 10/25/2017, 8:42 AM

## 2017-10-25 NOTE — Progress Notes (Signed)
Daily Progress Note   Patient Name: ELMA SHANDS       Date: 10/25/2017 DOB: 10-Oct-1946  Age: 71 y.o. MRN#: [id number] Attending Physician: Calvert Cantor, MD Primary Care Physician: Ignatius Specking, MD Admit Date: 10/18/2017  Reason for Consultation/Follow-up: Establishing goals of care and Psychosocial/spiritual support  Subjective: Patient seen, chart reviewed.  Staffed with bedside RN.  Patient lethargic but will respond to my voice by saying "I just want to go home" she does not follow commands and was otherwise not conversant  Length of Stay: 7  Current Medications: Scheduled Meds:  . aspirin  81 mg Oral Daily  . calcium carbonate  1 tablet Oral BID  . feeding supplement (PRO-STAT SUGAR FREE 64)  30 mL Oral TID BM  . heparin  5,000 Units Subcutaneous Q8H  . hydrocortisone sod succinate (SOLU-CORTEF) inj  25 mg Intravenous Q12H  . insulin aspart  0-15 Units Subcutaneous TID WC  . lactulose  30 g Oral BID  . levothyroxine  62.5 mcg Intravenous QAC breakfast  . mouth rinse  15 mL Mouth Rinse BID  . multivitamin with minerals  1 tablet Oral Daily  . octreotide  100 mcg Subcutaneous Q8H  . pantoprazole (PROTONIX) IV  40 mg Intravenous Q24H  . rifaximin  550 mg Oral BID  . sodium bicarbonate  50 mEq Intravenous Once  . sodium polystyrene  60 g Rectal Q6H    Continuous Infusions: . sodium chloride 10 mL/hr at 10/21/17 0600  . calcium gluconate    . furosemide Stopped (10/25/17 1453)  . meropenem (MERREM) IV Stopped (10/25/17 0424)    PRN Meds: Place/Maintain arterial line **AND** sodium chloride, acetaminophen (TYLENOL) oral liquid 160 mg/5 mL, acetaminophen, acetaminophen, sodium chloride flush  Physical Exam  Constitutional: She appears well-developed and well-nourished.    Anasarca  Cardiovascular: Normal rate.  Pulmonary/Chest:  Shallow, irregular  Neurological:  Lethargic Unable to follow commands  Skin: Skin is dry. There is pallor.  Psychiatric:  Stating I just want to go home  Nursing note and vitals reviewed.           Vital Signs: BP 136/85 (BP Location: Right Arm)   Pulse 78   Temp 97.6 F (36.4 C) (Axillary)   Resp 11   Ht 5\' 6"  (1.676 m)   Wt 116.3 kg (  256 lb 6.3 oz)   SpO2 98%   BMI 41.38 kg/m  SpO2: SpO2: 98 % O2 Device: O2 Device: Nasal Cannula O2 Flow Rate: O2 Flow Rate (L/min): 1 L/min  Intake/output summary:   Intake/Output Summary (Last 24 hours) at 10/25/2017 1603 Last data filed at 10/25/2017 1356 Gross per 24 hour  Intake 120 ml  Output 325 ml  Net -205 ml   LBM: Last BM Date: 10/22/17 Baseline Weight: Weight: 115.7 kg (255 lb) Most recent weight: Weight: 116.3 kg (256 lb 6.3 oz)       Palliative Assessment/Data:    Flowsheet Rows     Most Recent Value  Intake Tab  Referral Department  Critical care  Unit at Time of Referral  ICU  Palliative Care Primary Diagnosis  Sepsis/Infectious Disease  Date Notified  10/19/17  Palliative Care Type  New Palliative care  Reason for referral  Clarify Goals of Care, Psychosocial or Spiritual support  Date of Admission  10/18/17  Date first seen by Palliative Care  10/20/17  # of days Palliative referral response time  1 Day(s)  # of days IP prior to Palliative referral  1  Clinical Assessment  Palliative Performance Scale Score  30%  Psychosocial & Spiritual Assessment  Palliative Care Outcomes      Patient Active Problem List   Diagnosis Date Noted  . Cellulitis of lower extremity   . Palliative care by specialist   . DNR (do not resuscitate)   . NASH (nonalcoholic steatohepatitis)   . Septic shock (HCC) 10/18/2017  . Sepsis (HCC) 10/08/2017  . Elevated lactic acid level 10/08/2017  . Cellulitis of right foot 10/08/2017  . Falls frequently 10/08/2017   . Altered mental status, unspecified 10/08/2017  . Dehydration 10/08/2017  . AKI (acute kidney injury) (HCC) 10/08/2017  . Hypertension 10/08/2017  . Type 2 diabetes mellitus with other specified complication (HCC) 06/18/2017  . Thrombocytopenia (HCC) 06/18/2017  . Multiple closed fractures of ribs of left side   . T12 compression fracture (HCC) 06/13/2017  . 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 Intractable pain 06/12/2017  . Diastolic CHF (HCC) 02/07/2014  . Cirrhosis (HCC) 02/07/2014  . Hypothyroidism 02/07/2014  . Anxiety state, unspecified 02/07/2014  . 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   She was discharged to home under the care of her husband and apparently was doing okay, eating, getting about in the house using a walker, had baseline intermittent confusion.   Over the past 1-2 days she began to have worsening confusion and worsening discoloration, redness, and heat over the lower extremities. Her husband called EMS given concerns about her worsening lower extremity wounds. In the emergency room she was noted to be confused. She had too numerous to count lesions lesions/skin tears/ulcerations all over her body the worst over her lower extremities. She was afebrile, but noted to be hypotensive with blood pressure in the 80s she was started on norepinephrine and admitted to the ICU.Patient has been weaned off of vasopressors, however despite antibiotics and aggressive treatment she remains obtunded for most part of the day,  and she has progressive renal failure.  Palliative consulted and ongoing goals of care discussions are underway    Assessment: Called patient's husband, Fayrene FearingJames.  Updated him as to current clinical condition.  Fayrene FearingJames shares with me how sick his wife has been and how debilitated.  He states "I believe she will not survive this ".  I did share with him that  patient has multisystem failure; that my impression also was that if things continued as they are,  her a prognosis maybe just weeks.  I introduced  the topic of residential hospice to [place] again and he stated "were getting close to that".  Patient at this point is unable to take oral medications.  Trial Kayexalate enema for hyperkalemia is ordered for today.  Patient's ammonia level now 26, albumin 1.6, creatinine 3.59  Recommendations/Plan:  Continue to treat the treatable  Anticipate family ready to move towards full comfort care by 10/27/2017; patient likely would qualify for residential hospice  Goals of Care and Additional Recommendations:  Limitations on Scope of Treatment: Minimize Medications, No Artificial Feeding, No Chemotherapy, No Hemodialysis, No Radiation, No Surgical Procedures and No Tracheostomy  Code Status:    Code Status Orders  (From admission, onward)        Start     Ordered   10/20/17 1136  Do not attempt resuscitation (DNR)  Continuous    Question Answer Comment  In the event of cardiac or respiratory ARREST Do not call a "code blue"   In the event of cardiac or respiratory ARREST Do not perform Intubation, CPR, defibrillation or ACLS   In the event of cardiac or respiratory ARREST Use medication by any route, position, wound care, and other measures to relive pain and suffering. May use oxygen, suction and manual treatment of airway obstruction as needed for comfort.      10/20/17 1135    Code Status History    Date Active Date Inactive Code Status Order ID Comments User Context   10/18/2017 15:41  10/20/2017 11:35 Full Code [id number]  Simonne MartinetBabcock, Peter E, NP Inpatient   10/18/2017 07:39 10/18/2017 15:41 DNR [id number]  Shon BatonHorton, Courtney F, MD ED   10/08/2017 18:07 10/11/2017 22:18 Full Code [id number]  Cleora FleetJohnson, Clanford L, MD Inpatient   06/12/2017 17:38 06/19/2017 18:17 Full Code [id number]  Coralie KeensArrien, Mauricio Daniel, MD Inpatient   09/22/2014 10:46 09/23/2014 03:39 Full Code [id number]  Oneal Grouteveshwar, Sanjeev K, MD HOV       Prognosis:   < 2 weeks in the setting of Nash cirrhosis, hepatic encephalopathy, diastolic heart failure, worsening kidney function, acute on chronic kidney disease, chronic thrombocytopenia  Discharge Planning:  To Be Determined  Care plan was discussed with Dr. Butler Denmarkizwan  Thank you for allowing the Palliative Medicine Team to assist in the care of this patient.   Time In: 1200 Time Out: 1225 Total Time 25 min Prolonged Time Billed  no       Greater than 50%  of this time was spent counseling and coordinating care related to the above assessment and plan.  Irean HongSarah Grace Ronnie Mallette, NP  Please contact Palliative Medicine Team phone at [phone] for questions and concerns.

## 2017-10-26 DIAGNOSIS — Z515 Encounter for palliative care: Secondary | ICD-10-CM

## 2017-10-26 LAB — CBC
HCT: 31.5 % — ABNORMAL LOW (ref 36.0–46.0)
HEMOGLOBIN: 10.4 g/dL — AB (ref 12.0–15.0)
MCH: 34 pg (ref 26.0–34.0)
MCHC: 33 g/dL (ref 30.0–36.0)
MCV: 102.9 fL — ABNORMAL HIGH (ref 78.0–100.0)
PLATELETS: 78 10*3/uL — AB (ref 150–400)
RBC: 3.06 MIL/uL — AB (ref 3.87–5.11)
RDW: 19.8 % — ABNORMAL HIGH (ref 11.5–15.5)
WBC: 15.5 10*3/uL — ABNORMAL HIGH (ref 4.0–10.5)

## 2017-10-26 LAB — BASIC METABOLIC PANEL
Anion gap: 14 (ref 5–15)
BUN: 122 mg/dL — ABNORMAL HIGH (ref 6–20)
CO2: 21 mmol/L — AB (ref 22–32)
CREATININE: 3.5 mg/dL — AB (ref 0.44–1.00)
Calcium: 8.5 mg/dL — ABNORMAL LOW (ref 8.9–10.3)
Chloride: 107 mmol/L (ref 101–111)
GFR calc Af Amer: 14 mL/min — ABNORMAL LOW (ref >60)
GFR calc non Af Amer: 12 mL/min — ABNORMAL LOW (ref >60)
Glucose, Bld: 198 mg/dL — ABNORMAL HIGH (ref 65–99)
POTASSIUM: 4.9 mmol/L (ref 3.5–5.1)
SODIUM: 142 mmol/L (ref 135–145)

## 2017-10-26 LAB — GLUCOSE, CAPILLARY
GLUCOSE-CAPILLARY: 196 mg/dL — AB (ref 65–99)
GLUCOSE-CAPILLARY: 164 mg/dL — AB (ref 65–99)
GLUCOSE-CAPILLARY: 198 mg/dL — AB (ref 65–99)
GLUCOSE-CAPILLARY: 202 mg/dL — AB (ref 65–99)
Glucose-Capillary: 207 mg/dL — ABNORMAL HIGH (ref 65–99)

## 2017-10-26 LAB — LACTIC ACID, PLASMA: LACTIC ACID, VENOUS: 3 mmol/L — AB (ref 0.5–1.9)

## 2017-10-26 LAB — AMMONIA: Ammonia: 36 umol/L — ABNORMAL HIGH (ref 9–35)

## 2017-10-26 MED ORDER — MORPHINE SULFATE (PF) 4 MG/ML IV SOLN
2.0000 mg | INTRAVENOUS | Status: DC | PRN
  Administered 2017-10-26 – 2017-10-27 (×3): 2 mg via INTRAVENOUS
  Filled 2017-10-26 (×3): qty 1

## 2017-10-26 MED ORDER — LEVOTHYROXINE SODIUM 25 MCG PO TABS
125.0000 ug | ORAL_TABLET | Freq: Every day | ORAL | Status: DC
  Filled 2017-10-26: qty 1

## 2017-10-26 MED ORDER — PANTOPRAZOLE SODIUM 40 MG PO PACK
40.0000 mg | PACK | Freq: Every day | ORAL | Status: DC
  Filled 2017-10-26: qty 20

## 2017-10-26 NOTE — Progress Notes (Addendum)
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   She was discharged to home under the care of her husband and apparently was doing okay, eating, getting about in the house using a walker, had baseline intermittent confusion.   Over the past 1-2 days she began to have worsening confusion and worsening discoloration, redness, and heat over the lower extremities. Her husband called EMS given concerns about her worsening lower extremity wounds. In the emergency room she was noted to be confused. She had too numerous to count lesions lesions/skin tears/ulcerations all over her body the worst over her lower extremities. She was afebrile, but noted to be hypotensive with blood pressure in the 80s she was started on norepinephrine and admitted to the ICU.  Patient has been weaned off of vasopressors, however despite antibiotics and aggressive treatment she remains obtunded for most part of the day, and she has progressive renal failure.  Palliative consulted and ongoing goals of care discussions are underway.    Subjective: Awakens briefly. Oriented to place today. Minimally verbal. Mostly asleep with mouth open or moaning while awak.   Still tends to repeat answers to questions.    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 Hepatic encephalopathy- has baseline confusion at home as well and therefore may have dementia Mild chronic thrombocytopenia possibly from cirrhosis - following ammonia level -  cont Lactulose and Rifaximin as tolerated (unable to take meds sometimes)  - platelets being followed  AKI  - anasarca Metabolic acidosis - being treated as possibly hepatorenal - per Dr Charlean SanfilippoGherghe's conversation with nephrology, she is not a candidate for dialysis - baseline Cr 0.8 in 8/18- up to 1.8 in Nov - now fluid overloaded with BUN < 100 and Cr ~ 3.5 - also has severely low albumin contributing to anasarca - on Lasix 80 mg BID- very little urine output- increase dose to 120 mg QID  - with a Cr of > 3, she needs much higher doses of Lasix to produce urine - edema improved but renal function unchanged - agree with palliative care discussions  Hyperkalemia - is not taking oral mediations - improved with rectal Kayexalate enema and Lasix  Grade 2 dCHF - last ECHO in 8/18- CXR on 12/12 only showed mild pulm vascular congestion     Palliative care by specialist   DNR (do not resuscitate)   Disposition  - not eating solid food- nursing trying pureed - not able to swallow medications - now bed bound and not able to move arms and legs on  her own - mostly confused with poor insight - renal function not improving with diuretics and lactic acid continues to be elevated - I suggest comfort care at this point- I have had and extensive converstation with her husband and he has decided on comfort care- will will give her a pureed diet as tolerated and oral meds as tolerated as well but ultimate goal is for her to go to a hospice home due to her over all condition  DVT  prophylaxis: Heparin Code Status: DNR Family Communication:  Disposition Plan: cont to follow in SDU Consultants:   PCCM  Palliative care Procedures:   none Antimicrobials:  Anti-infectives (From admission, onward)   Start     Dose/Rate Route Frequency Ordered Stop   10/21/17 0456  vancomycin (VANCOCIN) 1,750 mg in sodium chloride 0.9 % 500 mL IVPB  Status:  Discontinued     1,750 mg 250 mL/hr over 120 Minutes Intravenous Every 48 hours 10/19/17 1020 10/21/17 1351   10/19/17 0400  vancomycin (VANCOCIN) 1,250 mg in sodium chloride 0.9 % 250 mL IVPB  Status:  Discontinued     1,250 mg 166.7 mL/hr over 90 Minutes Intravenous Every 24 hours 10/18/17 1031 10/19/17 1020   10/18/17 2000  rifaximin (XIFAXAN) tablet 550 mg     550 mg Oral 2 times daily 10/18/17 1541     10/18/17 1600  meropenem (MERREM) 1 g in sodium chloride 0.9 % 100 mL IVPB     1 g 200 mL/hr over 30 Minutes Intravenous Every 12 hours 10/18/17 1546     10/18/17 1300  aztreonam (AZACTAM) 1 g in dextrose 5 % 50 mL IVPB  Status:  Discontinued     1 g 100 mL/hr over 30 Minutes Intravenous Every 8 hours 10/18/17 0835 10/18/17 1541   10/18/17 1200  metroNIDAZOLE (FLAGYL) IVPB 500 mg  Status:  Discontinued     500 mg 100 mL/hr over 60 Minutes Intravenous Every 8 hours 10/18/17 0835 10/18/17 1541   10/18/17 1200  vancomycin (VANCOCIN) 1,500 mg in sodium chloride 0.9 % 500 mL IVPB  Status:  Discontinued     1,500 mg 250 mL/hr over 120 Minutes Intravenous Every 48 hours 10/18/17 0835 10/18/17 1031   10/18/17 0415  aztreonam (AZACTAM) 2 g in dextrose 5 % 50 mL IVPB     2 g 100 mL/hr over 30 Minutes Intravenous  Once 10/18/17 0412 10/18/17 0532   10/18/17 0415  metroNIDAZOLE (FLAGYL) IVPB 500 mg     500 mg 100 mL/hr over 60 Minutes Intravenous  Once 10/18/17 0412 10/18/17 0547   10/18/17 0415  vancomycin (VANCOCIN) IVPB 1000 mg/200 mL premix     1,000 mg 200 mL/hr over 60 Minutes Intravenous  Once 10/18/17 0412 10/18/17 0555         Objective: Vitals:   10/25/17 1959 10/26/17 0500 10/26/17 0800 10/26/17 0913  BP:   (!) 115/54   Pulse:   77 80  Resp:   14 14  Temp: (!) 97.5 F (36.4 C) (!) 96 F (35.6 C)  (!) 97.3 F (36.3 C)  TempSrc: Axillary Axillary  Axillary  SpO2:   100% 99%  Weight:  112.2 kg (247 lb 5.7 oz)    Height:        Intake/Output Summary (Last 24 hours) at 10/26/2017 1249 Last data filed at 10/26/2017 0600 Gross per 24 hour  Intake 310 ml  Output 500 ml  Net -190 ml   Filed Weights   10/24/17 0300 10/25/17 0411  10/26/17 0500  Weight: 118.4 kg (261 lb 0.4 oz) 116.3 kg (256 lb 6.3 oz) 112.2 kg (247 lb 5.7 oz)    Examination: General exam: Appears comfortable  HEENT: PERRLA, oral mucosa very dry, no sclera icterus or thrush Respiratory system: Clear to auscultation. Respiratory effort normal. Cardiovascular system: S1 & S2 heard, RRR.  No murmurs  Gastrointestinal system: Abdomen soft, non-tender, nondistended. Normal bowel sound. No organomegaly Extremities: No cyanosis, clubbing - extensive edema Skin: + abrasions/ shallow ulcers extensively Psychiatry:  Quite confused and sleepy    Data Reviewed: I have personally reviewed following labs and imaging studies  CBC: Recent Labs  Lab 10/21/17 0608 10/23/17 0354 10/24/17 0755 10/25/17 1050 10/26/17 0546  WBC 20.9* 21.7* 13.3* 11.9* 15.5*  HGB 10.5* 10.8* 10.8* 9.8* 10.4*  HCT 32.6* 32.5* 33.0* 29.9* 31.5*  MCV 103.5* 101.9* 101.9* 102.4* 102.9*  PLT 111* 107* 92* 77* 78*   Basic Metabolic Panel: Recent Labs  Lab 10/20/17 0521  10/22/17 0623 10/23/17 0354 10/24/17 0755 10/25/17 1050 10/25/17 1730 10/26/17 0826  NA 137   < > 133* 135 136 140 139 142  K 4.8   < > 5.5* 4.7 5.5* 5.6* 5.4* 4.9  CL 105   < > 102 105 105 107 106 107  CO2 23   < > 22 21* 21* 21* 20* 21*  GLUCOSE 157*   < > 147* 199* 229* 197* 191* 198*  BUN 64*   < > 85* 89* 102* 114* 117* 122*  CREATININE 2.65*   < > 2.84* 3.05* 3.49* 3.59*  3.58* 3.50*  CALCIUM 8.2*   < > 8.1* 8.4* 8.7* 8.7* 8.4* 8.5*  MG 2.1  --  2.3 2.3  --   --   --   --   PHOS 5.9*  --  6.4* 6.6*  --   --   --   --    < > = values in this interval not displayed.   GFR: Estimated Creatinine Clearance: 19 mL/min (A) (by C-G formula based on SCr of 3.5 mg/dL (H)). Liver Function Tests: Recent Labs  Lab 10/21/17 1610 10/23/17 0354 10/24/17 0755 10/25/17 1730  AST 59* 94* 154* 82*  ALT 63* 71* 88* 58*  ALKPHOS 244* 309* 328* 248*  BILITOT 7.6* 6.9* 7.1* 8.2*  PROT 5.3* 5.5* 5.3* 5.2*  ALBUMIN 1.6* 1.6* 1.6* 2.2*   No results for input(s): LIPASE, AMYLASE in the last 168 hours. Recent Labs  Lab 10/21/17 0608 10/24/17 0448 10/25/17 1050 10/26/17 0826  AMMONIA 10 16 26  36*   Coagulation Profile: Recent Labs  Lab 10/21/17 0608 10/23/17 0354 10/24/17 0755  INR 2.36 1.84 1.99   Cardiac Enzymes: No results for input(s): CKTOTAL, CKMB, CKMBINDEX, TROPONINI in the last 168 hours. BNP (last 3 results) No results for input(s): PROBNP in the last 8760 hours. HbA1C: No results for input(s): HGBA1C in the last 72 hours. CBG: Recent Labs  Lab 10/25/17 1229 10/25/17 1615 10/25/17 2156 10/26/17 0807 10/26/17 1125  GLUCAP 188* 171* 166* 196* 164*   Lipid Profile: No results for input(s): CHOL, HDL, LDLCALC, TRIG, CHOLHDL, LDLDIRECT in the last 72 hours. Thyroid Function Tests: No results for input(s): TSH, T4TOTAL, FREET4, T3FREE, THYROIDAB in the last 72 hours. Anemia Panel: No results for input(s): VITAMINB12, FOLATE, FERRITIN, TIBC, IRON, RETICCTPCT in the last 72 hours. Urine analysis:    Component Value Date/Time   COLORURINE AMBER (A) 10/18/2017 0406   APPEARANCEUR HAZY (A) 10/18/2017 0406   LABSPEC 1.017 10/18/2017  0406   PHURINE 5.0 10/18/2017 0406   GLUCOSEU NEGATIVE 10/18/2017 0406   HGBUR NEGATIVE 10/18/2017 0406   BILIRUBINUR NEGATIVE 10/18/2017 0406   KETONESUR NEGATIVE 10/18/2017 0406   PROTEINUR NEGATIVE 10/18/2017  0406   NITRITE NEGATIVE 10/18/2017 0406   LEUKOCYTESUR NEGATIVE 10/18/2017 0406   Sepsis Labs: @LABRCNTIP (procalcitonin:4,lacticidven:4) ) Recent Results (from the past 240 hour(s))  Blood Culture (routine x 2)     Status: None   Collection Time: 10/18/17  4:00 AM  Result Value Ref Range Status   Specimen Description BLOOD RIGHT ARM  Final   Special Requests   Final    BOTTLES DRAWN AEROBIC ONLY Blood Culture adequate volume   Culture NO GROWTH 5 DAYS  Final   Report Status 10/23/2017 FINAL  Final  Blood Culture (routine x 2)     Status: None   Collection Time: 10/18/17  4:22 AM  Result Value Ref Range Status   Specimen Description BLOOD RIGHT ARM  Final   Special Requests   Final    BOTTLES DRAWN AEROBIC ONLY Blood Culture adequate volume   Culture NO GROWTH 5 DAYS  Final   Report Status 10/23/2017 FINAL  Final  MRSA PCR Screening     Status: None   Collection Time: 10/18/17 12:24 PM  Result Value Ref Range Status   MRSA by PCR NEGATIVE NEGATIVE Final    Comment:        The GeneXpert MRSA Assay (FDA approved for NASAL specimens only), is one component of a comprehensive MRSA colonization surveillance program. It is not intended to diagnose MRSA infection nor to guide or monitor treatment for MRSA infections.   Culture, blood (Routine X 2) w Reflex to ID Panel     Status: None (Preliminary result)   Collection Time: 10/22/17  6:30 AM  Result Value Ref Range Status   Specimen Description BLOOD LEFT ANTECUBITAL  Final   Special Requests   Final    BOTTLES DRAWN AEROBIC AND ANAEROBIC Blood Culture adequate volume   Culture NO GROWTH 4 DAYS  Final   Report Status PENDING  Incomplete  Culture, blood (Routine X 2) w Reflex to ID Panel     Status: None (Preliminary result)   Collection Time: 10/22/17  6:45 AM  Result Value Ref Range Status   Specimen Description BLOOD LEFT HAND  Final   Special Requests   Final    BOTTLES DRAWN AEROBIC AND ANAEROBIC Blood Culture adequate  volume   Culture NO GROWTH 4 DAYS  Final   Report Status PENDING  Incomplete  MRSA PCR Screening     Status: None   Collection Time: 10/23/17  3:08 AM  Result Value Ref Range Status   MRSA by PCR NEGATIVE NEGATIVE Final    Comment:        The GeneXpert MRSA Assay (FDA approved for NASAL specimens only), is one component of a comprehensive MRSA colonization surveillance program. It is not intended to diagnose MRSA infection nor to guide or monitor treatment for MRSA infections.          Radiology Studies: No results found.    Scheduled Meds: . aspirin  81 mg Oral Daily  . calcium carbonate  1 tablet Oral BID  . feeding supplement (PRO-STAT SUGAR FREE 64)  30 mL Oral TID BM  . heparin  5,000 Units Subcutaneous Q8H  . insulin aspart  0-15 Units Subcutaneous TID WC  . lactulose  30 g Oral BID  . levothyroxine  62.5 mcg Intravenous  QAC breakfast  . mouth rinse  15 mL Mouth Rinse BID  . multivitamin with minerals  1 tablet Oral Daily  . octreotide  100 mcg Subcutaneous Q8H  . pantoprazole (PROTONIX) IV  40 mg Intravenous Q24H  . rifaximin  550 mg Oral BID   Continuous Infusions: . sodium chloride 10 mL/hr at 10/25/17 2000  . furosemide Stopped (10/26/17 0936)  . meropenem (MERREM) IV Stopped (10/26/17 0333)     LOS: 8 days    Time spent in minutes: >50 min- large amount of time taken for palliative care discussion with husband.    Calvert Cantor, MD [facility] Pager: [email or web address] Password [facility] 10/26/2017, 12:49 PM

## 2017-10-26 NOTE — Progress Notes (Signed)
CRITICAL VALUE ALERT  Critical Value:  Lactic acid 3.0  Date & Time Notied:  10/26/17   0708  Provider Notified: Butler Denmarkizwan, MD  Orders Received/Actions taken:

## 2017-10-27 DIAGNOSIS — R601 Generalized edema: Secondary | ICD-10-CM

## 2017-10-27 LAB — GLUCOSE, CAPILLARY
Glucose-Capillary: 190 mg/dL — ABNORMAL HIGH (ref 65–99)
Glucose-Capillary: 164 mg/dL — ABNORMAL HIGH (ref 65–99)
Glucose-Capillary: 157 mg/dL — ABNORMAL HIGH (ref 65–99)

## 2017-10-27 LAB — CULTURE, BLOOD (ROUTINE X 2)
CULTURE: NO GROWTH
Culture: NO GROWTH
SPECIAL REQUESTS: ADEQUATE
Special Requests: ADEQUATE

## 2017-10-27 MED ORDER — GLYCOPYRROLATE 0.2 MG/ML IJ SOLN: 0.2000 mg | INTRAMUSCULAR | Status: DC | PRN

## 2017-10-27 MED ORDER — MORPHINE SULFATE (PF) 4 MG/ML IV SOLN
2.0000 mg | INTRAVENOUS | Status: DC | PRN
  Administered 2017-10-27: 2 mg via INTRAVENOUS
  Filled 2017-10-27: qty 1

## 2017-10-27 MED ORDER — LORAZEPAM 2 MG/ML IJ SOLN: 1.0000 mg | INTRAMUSCULAR | Status: DC | PRN

## 2017-10-27 NOTE — Clinical Social Work Note (Addendum)
[facility]. They will contact husband regarding a time to do paperwork and then call CSW back with a time to arrange transport. CSW paged MD to notify.  Charlynn CourtSarah Noemi Ishmael, CSW [phone]  1:56 pm Hospice needs MD to discontinue Xifaxan prior to setting up transport. CSW paged MD to notify.  Charlynn CourtSarah Fina Heizer, CSW [phone]

## 2017-10-27 NOTE — Progress Notes (Signed)
PT Cancellation Note  Patient Details Name: Nancy GentileKacie S Swilling MRN: [id number] DOB: Apr 03, 1946   Cancelled Treatment:    Reason Eval/Treat Not Completed: Medical issues which prohibited therapy(Pt comfort care and waiting on Hospice bed. Will sign off. )Nursing agrees.    Amadeo GarnetDawn F Jermarion Poffenberger 10/27/2017, 10:12 AM  Eber Jonesawn Kaitlynn Tramontana,PT Acute Rehabilitation [phone] (pager)

## 2017-10-27 NOTE — Discharge Summary (Addendum)
Physician Discharge Summary  Nancy Baldwin:[id number] DOB: 03/20/1946 DOA: 10/18/2017  PCP: Ignatius Specking, MD  Admit date: 10/18/2017 Discharge date: 10/27/2017  Admitted From: home Disposition:  hospice home   Recommendations for Outpatient Follow-up:  1. Continue wound care Discharge Condition:  stable   CODE STATUS:  DNR   Diet recommendation:  Regular or pureeded diet as tolerated (was not able to chew)     Discharge Diagnoses:  Active Problems:   Septic shock (HCC)   Cellulitis of lower extremity   Palliative care by specialist Severe fluid overloaded Hypoalbuminemia Chronic dialstolic CHF AKI Hyperkalemia Metabolic acidosis/ Lactic acidosis Thrombocytopenia  Morbid obesity Multiple ulcers on body Elevated INR  Cirrhosis due to NASH Failure to thrive  DNR (do not resuscitate)       Subjective: Barley awake. Falls asleep easily.   Brief Summary: Nancy Baldwin is a chronically ill-appearing 71 year old female with a significant medical history which includes: N/A cirrhosis in the setting of NASH, chronic diastolic heart failure, stage II/III chronic kidney disease, chronic thrombocytopenia, hepatic encephalopathy, and recent admission for right lower extremity cellulitis, discharged on 12/1 on doxycycline.   She was discharged to home under the care of her husband and apparently was doing okay, eating, getting about in the house using a walker, had baseline intermittent confusion.   Over the past 1-2 days she began to have worsening confusion and worsening discoloration, redness, and heat over the lower extremities. Her husband called EMS given concerns about her worsening lower extremity wounds. In the emergency room she was noted to be confused. She had too numerous to count lesions lesions/skin tears/ulcerations all over her body the worst over her lower extremities. She was afebrile, but noted to be hypotensive with blood pressure in the 80s she was  started on norepinephrine and admitted to the ICU.Patient has been weaned off of vasopressors, however despite antibiotics and aggressive treatment she remains obtunded for most part of the day, and she has progressive renal failure. Palliative consulted and ongoing goals of care discussions are underway.    Hospital Course:  Active Problems:   Septic shock- hypothermia, hypotension, leukocytosis, lactic acidosis - extensive cellulitis of right leg and of various parts of body- due to small ulcers through out body (appears to be due to very poor hygiene) - UA negative - pressors stopped on 12/11   - on Meropenem 12/16- given days of treatment    - has been on stress dose steroids but hypothermia (95-96 degrees without warming blanket) and mild lactic acidosis not resolving yet - WBC count has been up and down (on steroids) and unreliable-      Cellulitis of lower extremity - see above- improved  Eschar right foot - appears to be pressure ulcer  NASH Cirrhosis, 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 Hepatic encephalopathy- has baseline confusion at home as well and therefore may have dementia Mild chronic thrombocytopenia possibly from cirrhosis  - given Xifaxin and Lactulose but lately has not been swallowing medications - platelets being followed  AKI  - anasarca Metabolic acidosis - being treated as possibly hepatorenal - per Dr Charlean Sanfilippo conversation with nephrology, she is not a candidate for dialysis - baseline Cr 0.8 in 8/18- up to 1.8 in Nov - now fluid overloaded with BUN < 100 and Cr ~ 3.5- GFR is about 12 - also has severely low albumin contributing to anasarca - on Lasix 80 mg BID-  increase dose to 120 mg QID as there  was very little urine output - with a Cr of > 3, she needs much higher doses of Lasix to produce urine - edema improved with increase in dose but renal function unchanged  Hyperkalemia - is not taking oral mediations - improved with rectal  Kayexalate enema and Lasix  Grade 2 dCHF - last ECHO in 8/18- CXR on 12/12 only showed mild pulm vascular congestion     Palliative care by specialist   DNR (do not resuscitate)  Failure to thrive - not eating solid food or drinking fluid for a number of days now - not able to swallow medications - has been persistently hypothermic with mild lactic acidosis - previously ambulatory with walker- now bed bound and not able to move arms and legs on her own - mostly confused with poor insight- is very difficult to awaken at times - renal function not improving with diuretics and lactic acid continues to be elevated - 12/16- I suggest comfort care at this point- I have had an extensive converstation with her husband and he has decided on comfort care- will give her a pureed diet as tolerated and oral meds as tolerated as well but ultimate goal is for her to go to a hospice home due to her over all condition 12/17- not eating again today- still not taking medications- awakens slightly but falls asleep again- not moving arms or legs at all  Discharge Instructions   Allergies as of 10/27/2017      Reactions   Amoxicillin Other (See Comments)   Unknown   Codeine Other (See Comments)   Unknown   Decongest-aid [pseudoephedrine] Other (See Comments)   Unknown   Decongestant [oxymetazoline] Other (See Comments)   Unknown   Metformin And Related Other (See Comments)   Unkown   Tricor [fenofibrate] Other (See Comments)   Unknown      Medication List    STOP taking these medications   aspirin 81 MG tablet   doxycycline 100 MG tablet Commonly known as:  VIBRA-TABS   furosemide 20 MG tablet Commonly known as:  LASIX   insulin glargine 100 unit/mL Sopn Commonly known as:  LANTUS   pantoprazole 40 MG tablet Commonly known as:  PROTONIX   potassium chloride 10 MEQ tablet Commonly known as:  K-DUR   pravastatin 10 MG tablet Commonly known as:  PRAVACHOL   propranolol 20 MG  tablet Commonly known as:  INDERAL   rifaximin 550 MG Tabs tablet Commonly known as:  XIFAXAN   spironolactone 25 MG tablet Commonly known as:  ALDACTONE   TUMS 500 MG chewable tablet Generic drug:  calcium carbonate   venlafaxine XR 150 MG 24 hr capsule Commonly known as:  EFFEXOR-XR   vitamin C 100 MG tablet   Vitamin D (Cholecalciferol) 1000 units Tabs     TAKE these medications   haloperidol 0.5 MG tablet Commonly known as:  HALDOL Take 0.5 mg by mouth at bedtime.   lactulose 10 GM/15ML solution Commonly known as:  CHRONULAC Take 45 mLs (30 g total) by mouth 2 (two) times daily.   levothyroxine 125 MCG tablet Commonly known as:  SYNTHROID, LEVOTHROID Take 1 tablet (125 mcg total) by mouth daily before breakfast.   multivitamin with minerals Tabs tablet Take 1 tablet by mouth daily.       Allergies  Allergen Reactions  . Amoxicillin Other (See Comments)    Unknown  . Codeine Other (See Comments)    Unknown  . Decongest-Aid [Pseudoephedrine] Other (See Comments)  Unknown  . Decongestant [Oxymetazoline] Other (See Comments)    Unknown  . Metformin And Related Other (See Comments)    Unkown  . Tricor [Fenofibrate] Other (See Comments)    Unknown     Procedures/Studies:    Dg Chest 1 View  Result Date: 10/14/2017 CLINICAL DATA:  Pain following fall.  Shortness of Breath EXAM: CHEST 1 VIEW COMPARISON:  October 08, 2017 FINDINGS: There is atelectatic change in the left base. Lungs elsewhere clear. Heart is upper normal in size with pulmonary vascular within normal limits. There is an old fracture of the right proximal humerus with displacement of fracture fragments and chronic remodeling. Patient has had kyphoplasty procedures at T7 and T8. There is aortic atherosclerosis. IMPRESSION: Atelectatic change left base. Lungs elsewhere clear. Stable cardiac silhouette. There is aortic atherosclerosis. Chronic nonunited fracture proximal right humerus. Previous  thoracic spine fractures, status post kyphoplasty. Aortic Atherosclerosis (ICD10-I70.0). Electronically Signed   By: Bretta BangWilliam  Woodruff III M.D.   On: 10/14/2017 07:32   Dg Ribs Unilateral W/chest Left  Result Date: 10/08/2017 CLINICAL DATA:  Fall, left rib pain. EXAM: LEFT RIBS AND CHEST - 3+ VIEW COMPARISON:  Chest x-ray 08/16/2017 FINDINGS: Multiple left rib fractures are noted, involving at least ribs 4 through 8 laterally. At least some of these were noted on prior chest x-ray and prior CT from 06/12/2017. Lungs are clear. No effusions or pneumothorax. Heart is borderline in size. IMPRESSION: Multiple left lateral rib fractures, at least ribs 4 through 8. At least some with these were present on prior chest CT and chest x-rays dating back to 06/12/2017. No pneumothorax. Electronically Signed   By: Charlett NoseKevin  Dover M.D.   On: 10/08/2017 09:28   Dg Thoracic Spine 2 View  Result Date: 10/08/2017 CLINICAL DATA:  Back pain after fall today. EXAM: THORACIC SPINE 2 VIEWS COMPARISON:  CT scan of June 12, 2017. FINDINGS: Status post kyphoplasty of T7 and T8. No acute fracture or spondylolisthesis is noted. Disc spaces are well-maintained. Mild S-shaped scoliosis of lower thoracic spine is noted. IMPRESSION: Status post kyphoplasty of T7 and T8 vertebral bodies. No acute abnormality seen in the thoracic spine. Electronically Signed   By: Lupita RaiderJames  Green Jr, M.D.   On: 10/08/2017 09:39   Dg Lumbar Spine Complete  Result Date: 10/08/2017 CLINICAL DATA:  Low back pain after fall today. EXAM: LUMBAR SPINE - COMPLETE 4+ VIEW COMPARISON:  CT scan of June 12, 2017. FINDINGS: Moderate dextroscoliosis of lumbar spine is noted. Stable old compression fractures of T12 and L1 vertebral bodies is noted. Severe degenerative disc disease is noted at L3-4. Osteopenia is noted. No acute fracture or spondylolisthesis is noted. IMPRESSION: Old T12 and L1 fractures. No acute abnormality seen in the lumbar spine. Electronically  Signed   By: Lupita RaiderJames  Green Jr, M.D.   On: 10/08/2017 09:41   Ct Head Wo Contrast  Result Date: 10/08/2017 CLINICAL DATA:  Status post fall.  Poor historian. EXAM: CT HEAD WITHOUT CONTRAST CT CERVICAL SPINE WITHOUT CONTRAST TECHNIQUE: Multidetector CT imaging of the head and cervical spine was performed following the standard protocol without intravenous contrast. Multiplanar CT image reconstructions of the cervical spine were also generated. COMPARISON:  None. FINDINGS: CT HEAD FINDINGS Brain: No evidence of acute infarction, hemorrhage, extra-axial collection, ventriculomegaly, or mass effect. Generalized cerebral atrophy. Periventricular white matter low attenuation likely secondary to microangiopathy. Vascular: Cerebrovascular atherosclerotic calcifications are noted. Skull: Negative for fracture or focal lesion. Sinuses/Orbits: Visualized portions of the orbits are unremarkable. Visualized  portions of the paranasal sinuses and mastoid air cells are unremarkable. Other: None. CT CERVICAL SPINE FINDINGS Alignment: Normal. Skull base and vertebrae: No acute fracture. No primary bone lesion or focal pathologic process. Soft tissues and spinal canal: No prevertebral fluid or swelling. No visible canal hematoma. Disc levels: Degenerative disc disease with disc height loss at C5-6, C6-7 C7-T1. Broad-based disc osteophyte complex at C5-6 and C6-7 impressing on the thecal sac. Bilateral uncovertebral degenerative changes at C6-7 with foraminal encroachment. Upper chest: Lung apices are clear. Other: No fluid collection or hematoma. IMPRESSION: 1. No acute intracranial pathology. 2. No acute osseous injury of the cervical spine. Electronically Signed   By: Elige Ko   On: 10/08/2017 09:49   Ct Cervical Spine Wo Contrast  Result Date: 10/08/2017 CLINICAL DATA:  Status post fall.  Poor historian. EXAM: CT HEAD WITHOUT CONTRAST CT CERVICAL SPINE WITHOUT CONTRAST TECHNIQUE: Multidetector CT imaging of the head and  cervical spine was performed following the standard protocol without intravenous contrast. Multiplanar CT image reconstructions of the cervical spine were also generated. COMPARISON:  None. FINDINGS: CT HEAD FINDINGS Brain: No evidence of acute infarction, hemorrhage, extra-axial collection, ventriculomegaly, or mass effect. Generalized cerebral atrophy. Periventricular white matter low attenuation likely secondary to microangiopathy. Vascular: Cerebrovascular atherosclerotic calcifications are noted. Skull: Negative for fracture or focal lesion. Sinuses/Orbits: Visualized portions of the orbits are unremarkable. Visualized portions of the paranasal sinuses and mastoid air cells are unremarkable. Other: None. CT CERVICAL SPINE FINDINGS Alignment: Normal. Skull base and vertebrae: No acute fracture. No primary bone lesion or focal pathologic process. Soft tissues and spinal canal: No prevertebral fluid or swelling. No visible canal hematoma. Disc levels: Degenerative disc disease with disc height loss at C5-6, C6-7 C7-T1. Broad-based disc osteophyte complex at C5-6 and C6-7 impressing on the thecal sac. Bilateral uncovertebral degenerative changes at C6-7 with foraminal encroachment. Upper chest: Lung apices are clear. Other: No fluid collection or hematoma. IMPRESSION: 1. No acute intracranial pathology. 2. No acute osseous injury of the cervical spine. Electronically Signed   By: Elige Ko   On: 10/08/2017 09:49   Dg Chest Port 1 View  Result Date: 10/22/2017 CLINICAL DATA:  Shortness of breath. EXAM: PORTABLE CHEST 1 VIEW COMPARISON:  One-view chest x-ray 10/18/2017. FINDINGS: Heart size is normal. A right IJ catheter is stable. There lung volumes are slightly improved. Pulmonary vascular congestion remains. Atelectasis or scarring is stable at the left base. IMPRESSION: 1. Slight improved aeration without significant change in mild pulmonary vascular congestion. 2. Scarring or atelectasis at the left base  is stable. Electronically Signed   By: Marin Roberts M.D.   On: 10/22/2017 07:42   Dg Chest Portable 1 View  Result Date: 10/18/2017 CLINICAL DATA:  Sepsis.  Central line placement. EXAM: PORTABLE CHEST 1 VIEW COMPARISON:  Same day FINDINGS: Right internal jugular central line tip is at the SVC RA junction. No pneumothorax. Left lower lobe atelectasis persists. IMPRESSION: Right internal jugular central line tip well positioned at the SVC RA junction. Electronically Signed   By: Paulina Fusi M.D.   On: 10/18/2017 07:26   Dg Chest Port 1 View  Result Date: 10/18/2017 CLINICAL DATA:  Altered consciousness.  Sepsis. EXAM: PORTABLE CHEST 1 VIEW COMPARISON:  October 14, 2017 FINDINGS: Stable cardiomegaly. The hila and mediastinum are normal. Mild opacity in left base is favored to represent atelectasis. Pulmonary venous congestion without overt edema. No acute abnormalities otherwise seen. IMPRESSION: Cardiomegaly and pulmonary venous congestion. Opacity in left  base is favored to represent atelectasis, not significantly changed. Electronically Signed   By: Gerome Sam III M.D   On: 10/18/2017 05:11   Dg Knee Complete 4 Views Left  Result Date: 10/14/2017 CLINICAL DATA:  Pain following fall EXAM: LEFT KNEE - COMPLETE 4+ VIEW COMPARISON:  February 18, 2017 FINDINGS: Frontal, lateral, and bilateral oblique views were obtained. No evident fracture or dislocation. No joint effusion. The joint spaces appear unremarkable. No erosive change. IMPRESSION: No fracture or dislocation. No joint effusion. No appreciable arthropathic change. Electronically Signed   By: Bretta Bang III M.D.   On: 10/14/2017 07:33   Dg Knee Complete 4 Views Right  Result Date: 10/14/2017 CLINICAL DATA:  Pain following a fall EXAM: RIGHT KNEE - COMPLETE 4+ VIEW COMPARISON:  None. FINDINGS: Frontal, lateral, and bilateral oblique views were obtained. No evident fracture or dislocation. No joint effusion. Joint spaces appear  unremarkable. No erosive change. IMPRESSION: No fracture or dislocation. No joint effusion. No appreciable arthropathy. Electronically Signed   By: Bretta Bang III M.D.   On: 10/14/2017 07:34   Dg Foot Complete Right  Result Date: 10/08/2017 CLINICAL DATA:  Right foot injury due to a fall getting out of bed this morning. Initial encounter. EXAM: RIGHT FOOT COMPLETE - 3+ VIEW COMPARISON:  None. FINDINGS: No acute bony or joint abnormality is identified. Well corticated bone fragment off the dorsal margin of the neck of the talus is most consistent with remote injury. Soft tissues about the foot appear somewhat swollen. IMPRESSION: Soft tissue swelling without acute bony abnormality. Small well corticated bone fragment off the dorsal neck of the talus is likely due to remote injury. Electronically Signed   By: Drusilla Kanner M.D.   On: 10/08/2017 09:34   Dg Hips Bilat W Or Wo Pelvis 3-4 Views  Result Date: 10/08/2017 CLINICAL DATA:  Hip pain after fall this morning. EXAM: DG HIP (WITH OR WITHOUT PELVIS) 3-4V BILAT COMPARISON:  None. FINDINGS: There is no evidence of hip fracture or dislocation. There is no evidence of arthropathy or other focal bone abnormality. IMPRESSION: Normal bilateral hips. Electronically Signed   By: Lupita Raider, M.D.   On: 10/08/2017 09:32       Discharge Exam: Vitals:   10/27/17 0800 10/27/17 1100  BP: (!) 119/50   Pulse: 89   Resp: 13   Temp:  (!) 96 F (35.6 C)  SpO2: 97%    Vitals:   10/27/17 0500 10/27/17 0718 10/27/17 0800 10/27/17 1100  BP:   (!) 119/50   Pulse:   89   Resp:   13   Temp:  (!) 95.6 F (35.3 C)  (!) 96 F (35.6 C)  TempSrc:  Axillary  Axillary  SpO2:   97%   Weight: 110.6 kg (243 lb 13.3 oz)     Height:        General: Pt is lethargic - awakens briefly- voice is barely audible Cardiovascular: RRR, S1/S2 +, no rubs, no gallops Respiratory: CTA bilaterally, no wheezing, no rhonchi Abdominal: Soft, NT, ND, bowel sounds  + Extremities: + anasarca    The results of significant diagnostics from this hospitalization (including imaging, microbiology, ancillary and laboratory) are listed below for reference.     Microbiology: Recent Results (from the past 240 hour(s))  Blood Culture (routine x 2)     Status: None   Collection Time: 10/18/17  4:00 AM  Result Value Ref Range Status   Specimen Description BLOOD RIGHT ARM  Final   Special Requests   Final    BOTTLES DRAWN AEROBIC ONLY Blood Culture adequate volume   Culture NO GROWTH 5 DAYS  Final   Report Status 10/23/2017 FINAL  Final  Blood Culture (routine x 2)     Status: None   Collection Time: 10/18/17  4:22 AM  Result Value Ref Range Status   Specimen Description BLOOD RIGHT ARM  Final   Special Requests   Final    BOTTLES DRAWN AEROBIC ONLY Blood Culture adequate volume   Culture NO GROWTH 5 DAYS  Final   Report Status 10/23/2017 FINAL  Final  MRSA PCR Screening     Status: None   Collection Time: 10/18/17 12:24 PM  Result Value Ref Range Status   MRSA by PCR NEGATIVE NEGATIVE Final    Comment:        The GeneXpert MRSA Assay (FDA approved for NASAL specimens only), is one component of a comprehensive MRSA colonization surveillance program. It is not intended to diagnose MRSA infection nor to guide or monitor treatment for MRSA infections.   Culture, blood (Routine X 2) w Reflex to ID Panel     Status: None   Collection Time: 10/22/17  6:30 AM  Result Value Ref Range Status   Specimen Description BLOOD LEFT ANTECUBITAL  Final   Special Requests   Final    BOTTLES DRAWN AEROBIC AND ANAEROBIC Blood Culture adequate volume   Culture NO GROWTH 5 DAYS  Final   Report Status 10/27/2017 FINAL  Final  Culture, blood (Routine X 2) w Reflex to ID Panel     Status: None   Collection Time: 10/22/17  6:45 AM  Result Value Ref Range Status   Specimen Description BLOOD LEFT HAND  Final   Special Requests   Final    BOTTLES DRAWN AEROBIC AND  ANAEROBIC Blood Culture adequate volume   Culture NO GROWTH 5 DAYS  Final   Report Status 10/27/2017 FINAL  Final  MRSA PCR Screening     Status: None   Collection Time: 10/23/17  3:08 AM  Result Value Ref Range Status   MRSA by PCR NEGATIVE NEGATIVE Final    Comment:        The GeneXpert MRSA Assay (FDA approved for NASAL specimens only), is one component of a comprehensive MRSA colonization surveillance program. It is not intended to diagnose MRSA infection nor to guide or monitor treatment for MRSA infections.      Labs: BNP (last 3 results) Recent Labs    06/14/17 0624 10/14/17 0631 10/18/17 0646  BNP 306.0* 431.0* 1,302.0*   Basic Metabolic Panel: Recent Labs  Lab 10/22/17 0623 10/23/17 0354 10/24/17 0755 10/25/17 1050 10/25/17 1730 10/26/17 0826  NA 133* 135 136 140 139 142  K 5.5* 4.7 5.5* 5.6* 5.4* 4.9  CL 102 105 105 107 106 107  CO2 22 21* 21* 21* 20* 21*  GLUCOSE 147* 199* 229* 197* 191* 198*  BUN 85* 89* 102* 114* 117* 122*  CREATININE 2.84* 3.05* 3.49* 3.59* 3.58* 3.50*  CALCIUM 8.1* 8.4* 8.7* 8.7* 8.4* 8.5*  MG 2.3 2.3  --   --   --   --   PHOS 6.4* 6.6*  --   --   --   --    Liver Function Tests: Recent Labs  Lab 10/21/17 0608 10/23/17 0354 10/24/17 0755 10/25/17 1730  AST 59* 94* 154* 82*  ALT 63* 71* 88* 58*  ALKPHOS 244* 309* 328* 248*  BILITOT 7.6*  6.9* 7.1* 8.2*  PROT 5.3* 5.5* 5.3* 5.2*  ALBUMIN 1.6* 1.6* 1.6* 2.2*   No results for input(s): LIPASE, AMYLASE in the last 168 hours. Recent Labs  Lab 10/21/17 0608 10/24/17 0448 10/25/17 1050 10/26/17 0826  AMMONIA 10 16 26  36*   CBC: Recent Labs  Lab 10/21/17 0608 10/23/17 0354 10/24/17 0755 10/25/17 1050 10/26/17 0546  WBC 20.9* 21.7* 13.3* 11.9* 15.5*  HGB 10.5* 10.8* 10.8* 9.8* 10.4*  HCT 32.6* 32.5* 33.0* 29.9* 31.5*  MCV 103.5* 101.9* 101.9* 102.4* 102.9*  PLT 111* 107* 92* 77* 78*   Cardiac Enzymes: No results for input(s): CKTOTAL, CKMB, CKMBINDEX,  TROPONINI in the last 168 hours. BNP: Invalid input(s): POCBNP CBG: Recent Labs  Lab 10/26/17 1621 10/26/17 1844 10/26/17 2101 10/27/17 0804 10/27/17 1119  GLUCAP 198* 202* 207* 190* 164*   D-Dimer No results for input(s): DDIMER in the last 72 hours. Hgb A1c No results for input(s): HGBA1C in the last 72 hours. Lipid Profile No results for input(s): CHOL, HDL, LDLCALC, TRIG, CHOLHDL, LDLDIRECT in the last 72 hours. Thyroid function studies No results for input(s): TSH, T4TOTAL, T3FREE, THYROIDAB in the last 72 hours.  Invalid input(s): FREET3 Anemia work up No results for input(s): VITAMINB12, FOLATE, FERRITIN, TIBC, IRON, RETICCTPCT in the last 72 hours. Urinalysis    Component Value Date/Time   COLORURINE AMBER (A) 10/18/2017 0406   APPEARANCEUR HAZY (A) 10/18/2017 0406   LABSPEC 1.017 10/18/2017 0406   PHURINE 5.0 10/18/2017 0406   GLUCOSEU NEGATIVE 10/18/2017 0406   HGBUR NEGATIVE 10/18/2017 0406   BILIRUBINUR NEGATIVE 10/18/2017 0406   KETONESUR NEGATIVE 10/18/2017 0406   PROTEINUR NEGATIVE 10/18/2017 0406   NITRITE NEGATIVE 10/18/2017 0406   LEUKOCYTESUR NEGATIVE 10/18/2017 0406   Sepsis Labs Invalid input(s): PROCALCITONIN,  WBC,  LACTICIDVEN Microbiology Recent Results (from the past 240 hour(s))  Blood Culture (routine x 2)     Status: None   Collection Time: 10/18/17  4:00 AM  Result Value Ref Range Status   Specimen Description BLOOD RIGHT ARM  Final   Special Requests   Final    BOTTLES DRAWN AEROBIC ONLY Blood Culture adequate volume   Culture NO GROWTH 5 DAYS  Final   Report Status 10/23/2017 FINAL  Final  Blood Culture (routine x 2)     Status: None   Collection Time: 10/18/17  4:22 AM  Result Value Ref Range Status   Specimen Description BLOOD RIGHT ARM  Final   Special Requests   Final    BOTTLES DRAWN AEROBIC ONLY Blood Culture adequate volume   Culture NO GROWTH 5 DAYS  Final   Report Status 10/23/2017 FINAL  Final  MRSA PCR Screening      Status: None   Collection Time: 10/18/17 12:24 PM  Result Value Ref Range Status   MRSA by PCR NEGATIVE NEGATIVE Final    Comment:        The GeneXpert MRSA Assay (FDA approved for NASAL specimens only), is one component of a comprehensive MRSA colonization surveillance program. It is not intended to diagnose MRSA infection nor to guide or monitor treatment for MRSA infections.   Culture, blood (Routine X 2) w Reflex to ID Panel     Status: None   Collection Time: 10/22/17  6:30 AM  Result Value Ref Range Status   Specimen Description BLOOD LEFT ANTECUBITAL  Final   Special Requests   Final    BOTTLES DRAWN AEROBIC AND ANAEROBIC Blood Culture adequate volume   Culture NO  GROWTH 5 DAYS  Final   Report Status 10/27/2017 FINAL  Final  Culture, blood (Routine X 2) w Reflex to ID Panel     Status: None   Collection Time: 10/22/17  6:45 AM  Result Value Ref Range Status   Specimen Description BLOOD LEFT HAND  Final   Special Requests   Final    BOTTLES DRAWN AEROBIC AND ANAEROBIC Blood Culture adequate volume   Culture NO GROWTH 5 DAYS  Final   Report Status 10/27/2017 FINAL  Final  MRSA PCR Screening     Status: None   Collection Time: 10/23/17  3:08 AM  Result Value Ref Range Status   MRSA by PCR NEGATIVE NEGATIVE Final    Comment:        The GeneXpert MRSA Assay (FDA approved for NASAL specimens only), is one component of a comprehensive MRSA colonization surveillance program. It is not intended to diagnose MRSA infection nor to guide or monitor treatment for MRSA infections.      Time coordinating discharge: Over 30 minutes  SIGNED:   Calvert CantorSaima Ciel Yanes, MD  [facility] 10/27/2017, 2:50 PM Pager   If 7PM-7AM, please contact night-coverage [email or web address] Password [email or web address]

## 2017-10-27 NOTE — Clinical Social Work Note (Addendum)
CSW facilitated patient discharge including contacting patient family and facility to confirm patient discharge plans. Clinical information faxed to facility and family agreeable with plan. CSW arranged ambulance transport via [facility]. RN to call report prior to discharge [phone]).  CSW will sign off for now as social work intervention is no longer needed. Please consult us again if new needs arise.  Charlynn CourtSarah Jalexus Brett, CSW [phone]

## 2017-10-27 NOTE — Progress Notes (Signed)
Pt to be [facility]. Report called to Zella Ballobin, RN at [phone]. Central lined removed. Pt awaiting arrival of [facility]. Will pass on to Night shift RN.

## 2017-10-27 NOTE — Progress Notes (Signed)
Chart reviewed and patient assessment completed. Patient moaning and grimacing. RN to give prn morphine. Plan [facility]. Discontinued medications/interventions not aimed at comfort.   NO CHARGE  Vennie HomansMegan Brayten Komar, FNP-C Palliative Medicine Team  Phone: [phone] Fax: [phone]

## 2017-10-27 NOTE — Clinical Social Work Note (Signed)
Clinical Social Work Assessment  Patient Details  Name: Nancy GentileKacie S Lamp MRN: [id number] Date of Birth: [birth year]  Date of referral:  10/27/17               Reason for consult:  Facility Placement, End of Life/Hospice                Permission sought to share information with:  Facility Medical sales representativeContact Representative, Family Supports Permission granted to share information::  Yes, Verbal Permission Granted  Name::     Fredderick PhenixJames Samarin  Agency::  Hospice Facilities  Relationship::  Husband  Contact Information:  [phone]  Housing/Transportation Living arrangements for the past 2 months:  Single Family Home Source of Information:  Patient, Medical Team, Spouse Patient Interpreter Needed:  None Criminal Activity/Legal Involvement Pertinent to Current Situation/Hospitalization:  No - Comment as needed Significant Relationships:  Adult Children, Spouse Lives with:  Spouse Do you feel safe going back to the place where you live?  No Need for family participation in patient care:  Yes (Comment)  Care giving concerns:  Residential hospice placement.   Social Worker assessment / plan:  Patient not fully oriented. No supports at bedside. CSW called patient's husband, introduced role, and explained that discharge planning would be discussed. Patient's husband confirmed plan for hospice facility and that [facility]. He stated there was no reason to continue giving her medications. CSW called, made verbal referral, and faxed requested documentation. No further concerns. CSW encouraged patient's husband to contact CSW as needed. CSW will continue to follow patient and her husband for support and facilitate discharge to hospice house.  Employment status:  Unemployed Database administratornsurance information:  Managed Medicare PT Recommendations:  Skilled Nursing Facility Information / Referral to community resources:  Other (Comment Required)(Hospice facilities)  Patient/Family's Response to care:  Patient  not fully oriented. Patient's husband agreeable to hospice house referral. [facility]. Patient's family supportive and involved in patient's care. Patient's husband appreciated social work intervention.  Patient/Family's Understanding of and Emotional Response to Diagnosis, Current Treatment, and Prognosis:  Patient not fully oriented. Patient's husband has a good understanding of the reason for admission and her prognosis. Patient's husband appears happy with hospital care.  Emotional Assessment Appearance:  Appears stated age Attitude/Demeanor/Rapport:  Unable to Assess Affect (typically observed):  Unable to Assess Orientation:  Oriented to Self, Oriented to Place Alcohol / Substance use:  Never Used Psych involvement (Current and /or in the community):  No (Comment)  Discharge Needs  Concerns to be addressed:  Care Coordination Readmission within the last 30 days:  Yes Current discharge risk:  Cognitively Impaired, Dependent with Mobility, Terminally ill Barriers to Discharge:  No Barriers Identified   Margarito LinerSarah C Lyndal Reggio, LCSW 10/27/2017, 12:09 PM

## 2017-11-11 DEATH — deceased

## 2018-10-31 IMAGING — DX DG KNEE COMPLETE 4+V*L*
4 series · 4 of 4 positions shown · non-contrast
Comparison: February 18, 2017

CLINICAL DATA: Pain following fall

EXAM:
LEFT KNEE - COMPLETE 4+ VIEW

[knee ap]
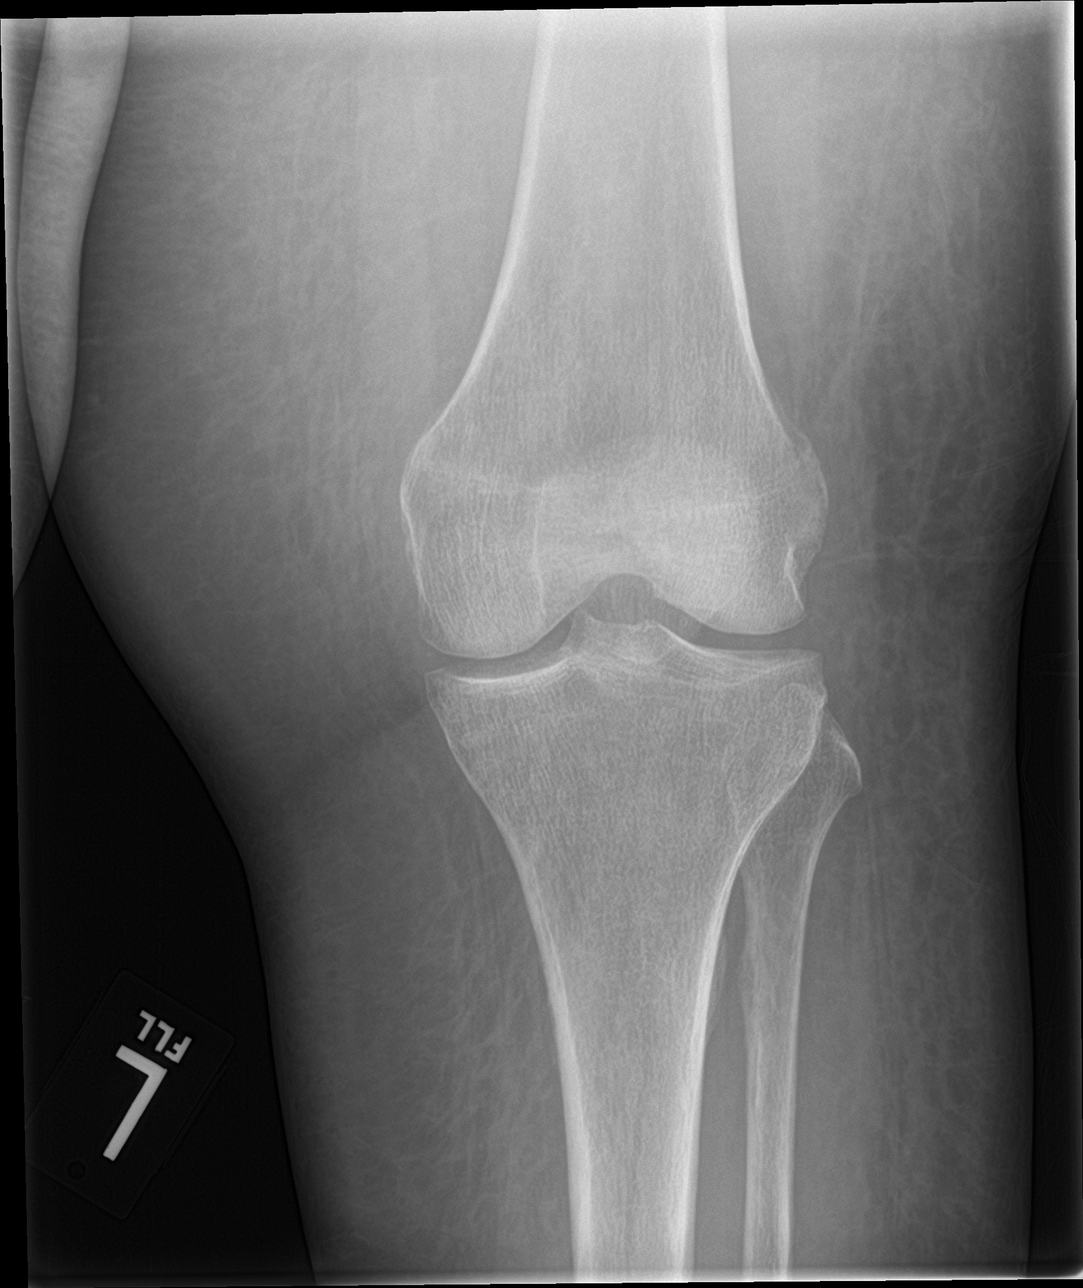

[tunnel]
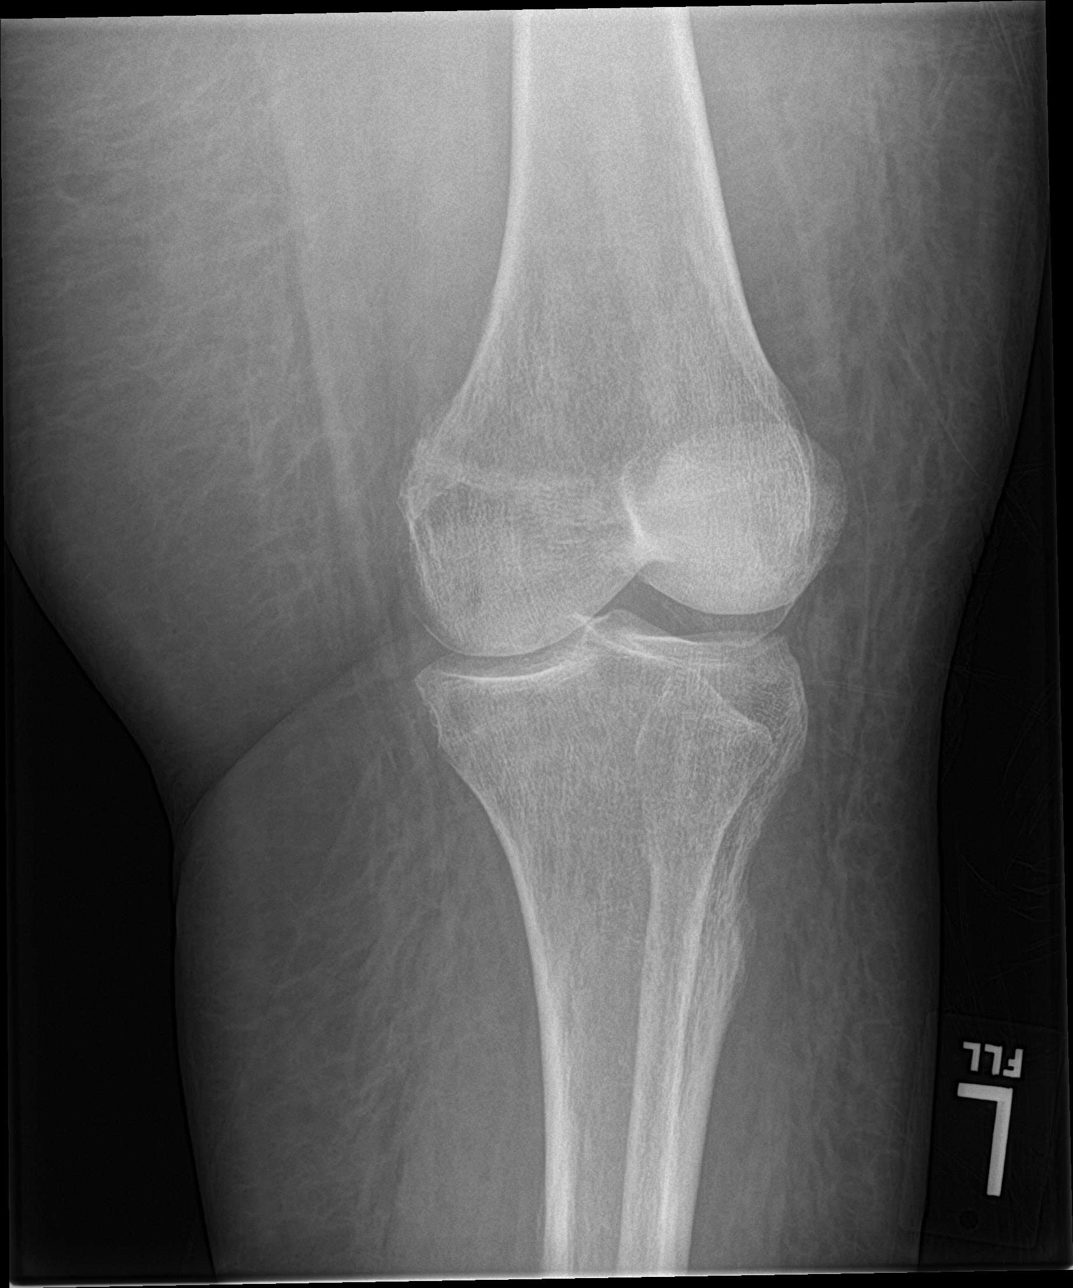

[knee lat]
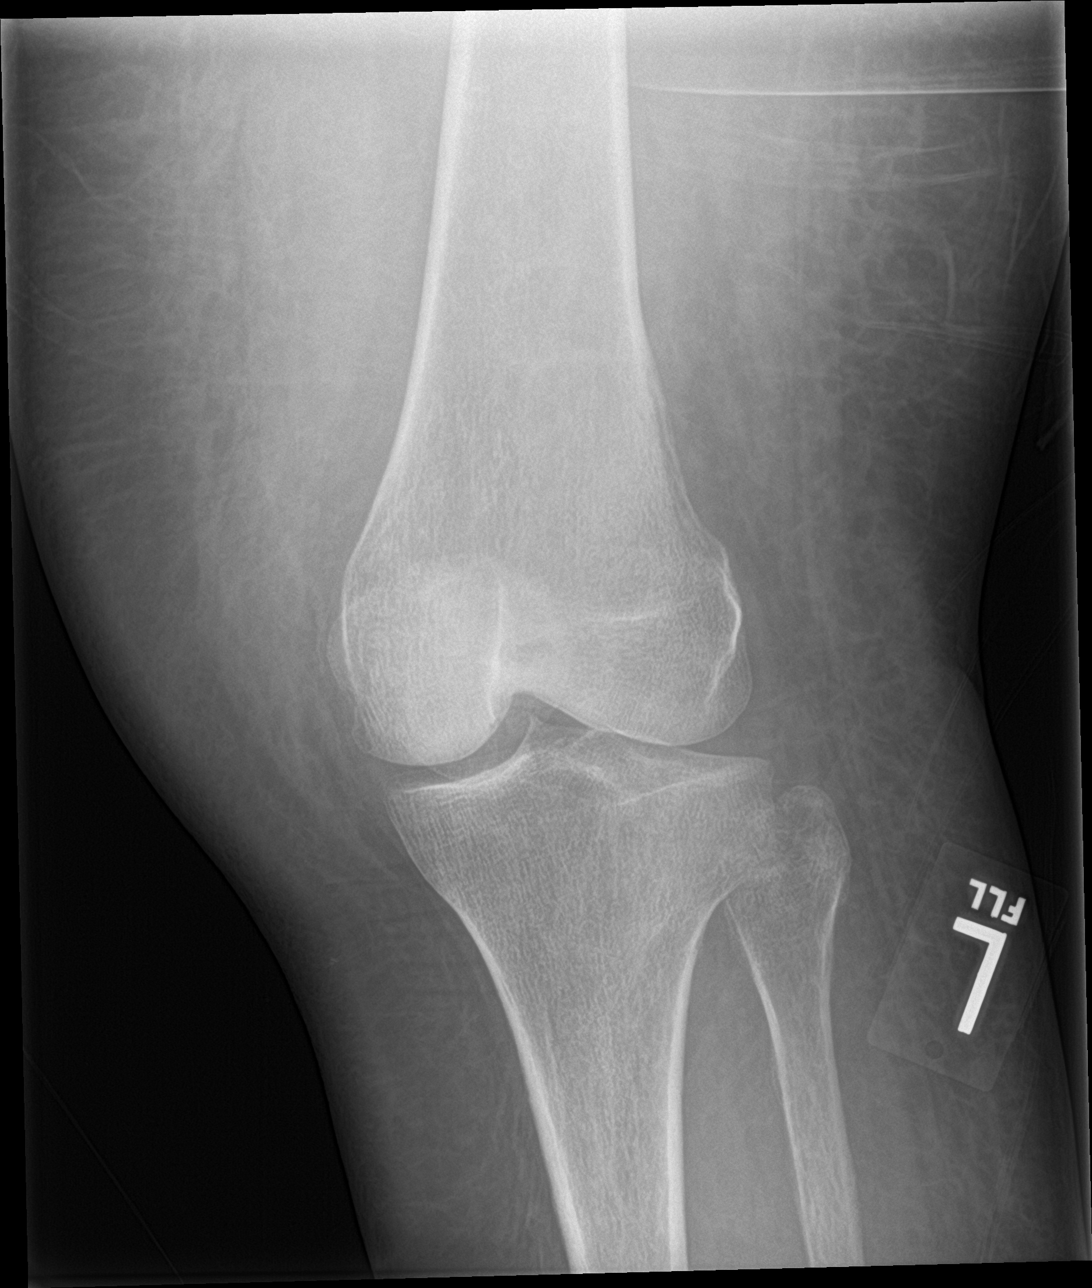

[knee sunrise]
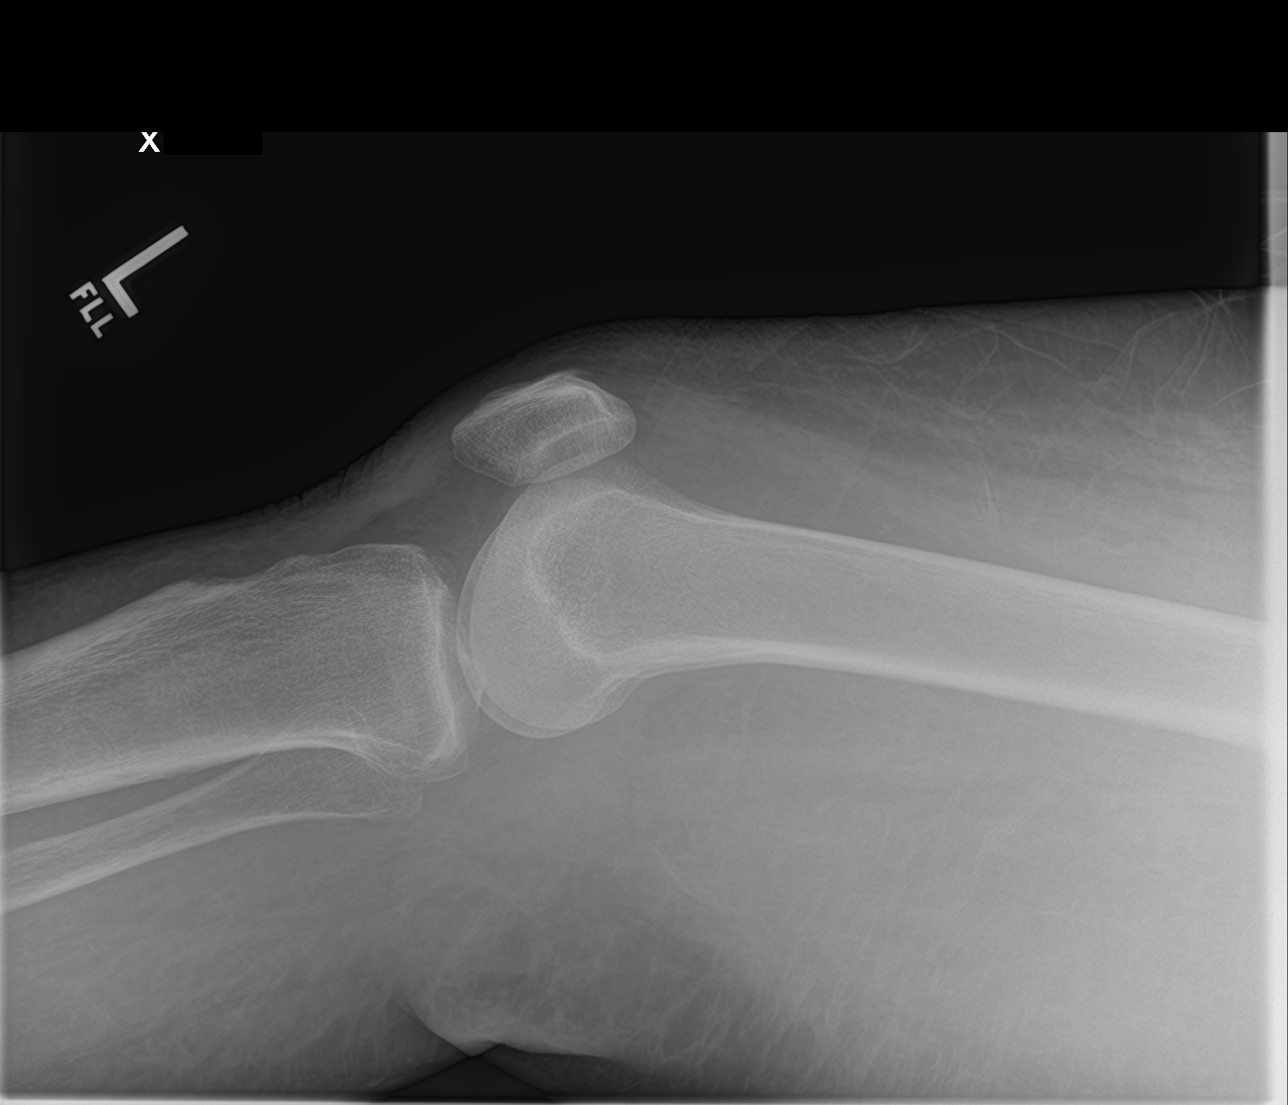

[4 of 4 positions shown; findings below may reference images not displayed]

FINDINGS: Frontal, lateral, and bilateral oblique views were obtained. No
evident fracture or dislocation. No joint effusion. The joint spaces
appear unremarkable. No erosive change.
IMPRESSION: No fracture or dislocation. No joint effusion. No appreciable
arthropathic change.

## 2018-10-31 IMAGING — DX DG CHEST 1V
1 series · 1 of 1 positions shown · non-contrast
Comparison: October 08, 2017

CLINICAL DATA: Pain following fall.  Shortness of Breath

EXAM:
CHEST 1 VIEW

[chest ap]
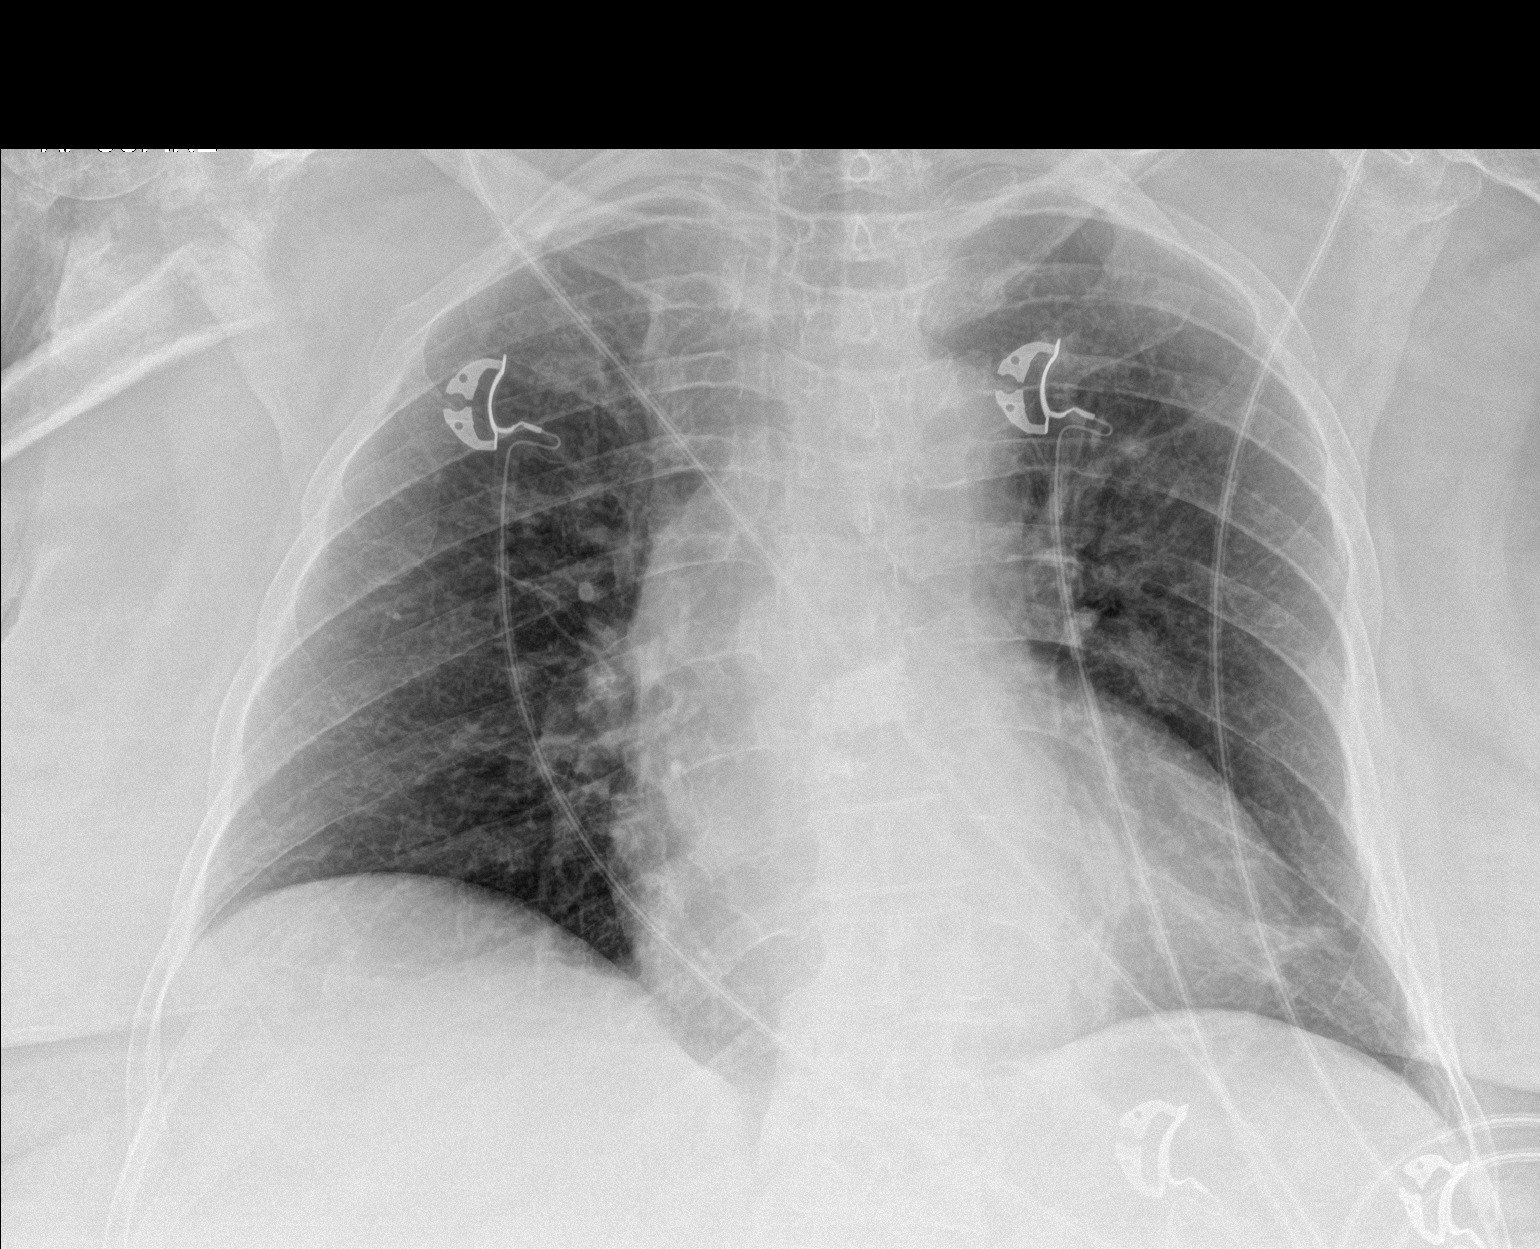

[1 of 1 positions shown; findings below may reference images not displayed]

FINDINGS: There is atelectatic change in the left base. Lungs elsewhere clear.
Heart is upper normal in size with pulmonary vascular within normal
limits.

There is an old fracture of the right proximal humerus with
displacement of fracture fragments and chronic remodeling. Patient
has had kyphoplasty procedures at T7 and T8. There is aortic
atherosclerosis.
IMPRESSION: Atelectatic change left base. Lungs elsewhere clear. Stable cardiac
silhouette. There is aortic atherosclerosis.

Chronic nonunited fracture proximal right humerus. Previous thoracic
spine fractures, status post kyphoplasty.

Aortic Atherosclerosis ([id number]).

## 2018-11-04 IMAGING — CR DG CHEST 1V PORT
1 series · 1 of 1 positions shown · non-contrast
Comparison: October 14, 2017

CLINICAL DATA: Altered consciousness.  Sepsis.

EXAM:
PORTABLE CHEST 1 VIEW

[portable]
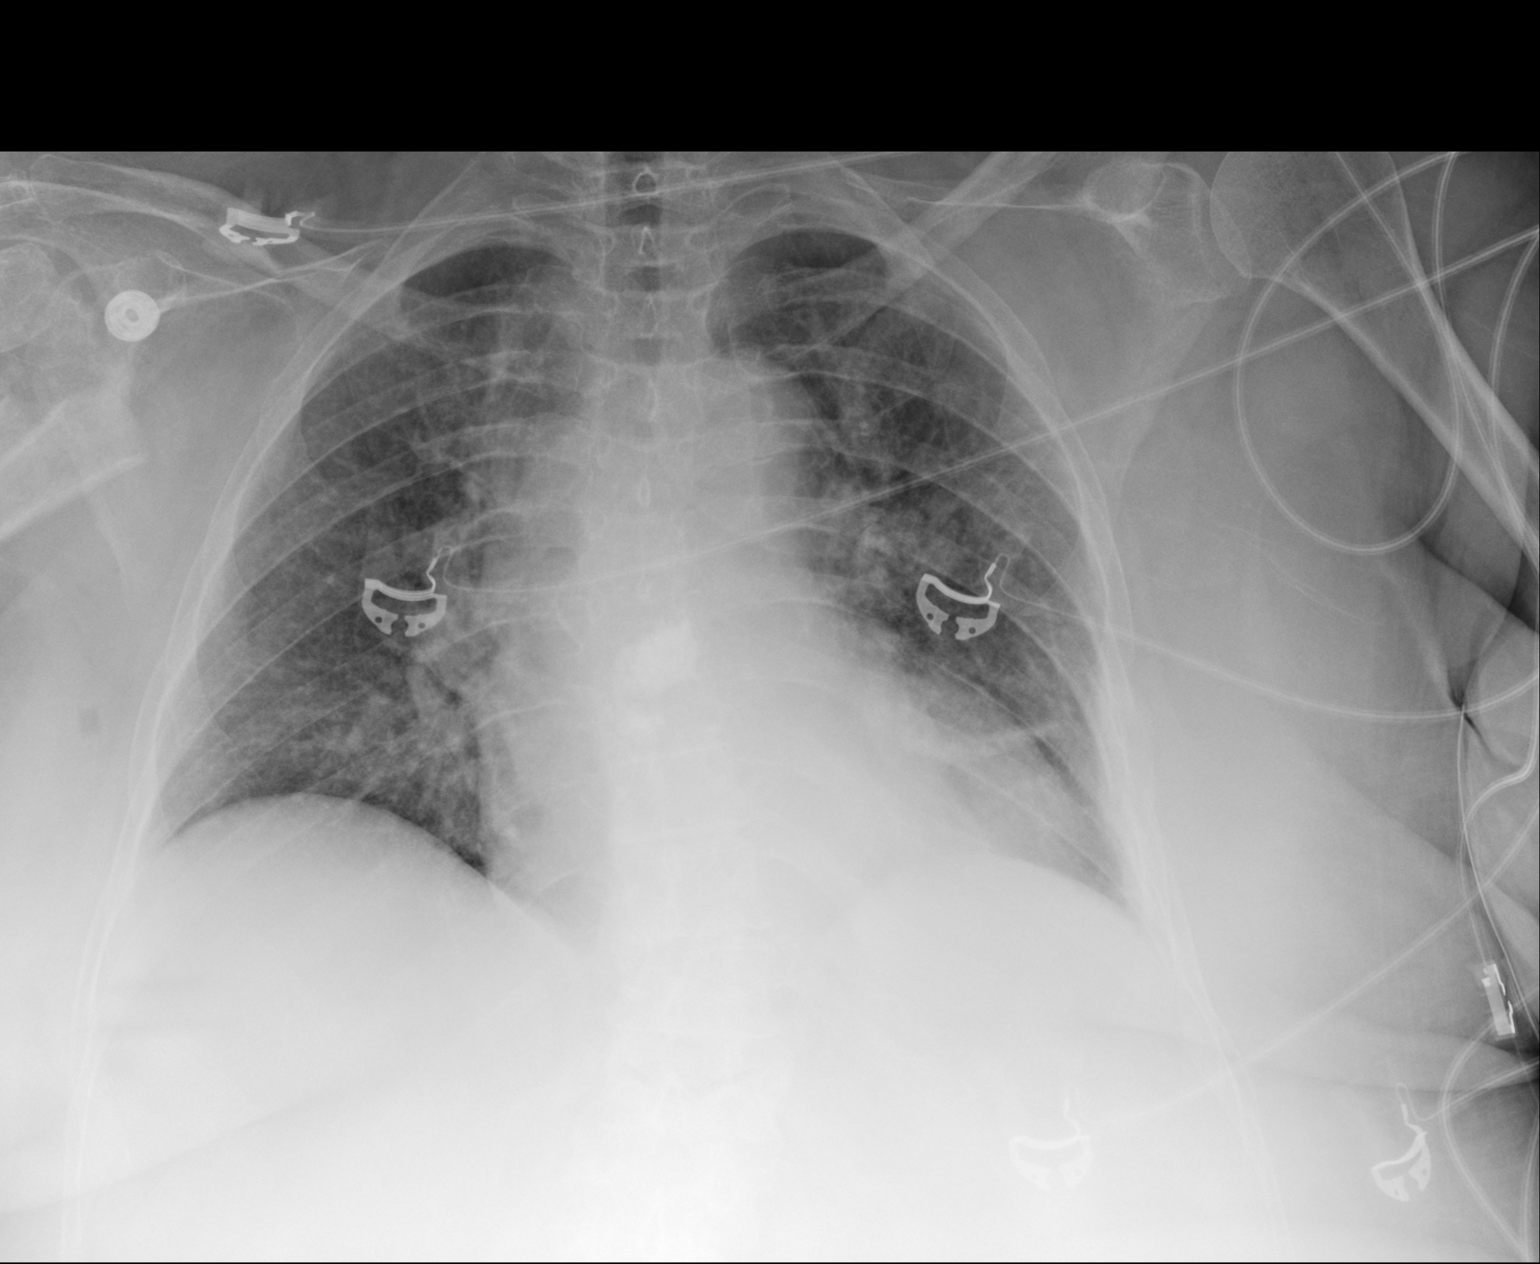

[1 of 1 positions shown; findings below may reference images not displayed]

FINDINGS: Stable cardiomegaly. The hila and mediastinum are normal. Mild
opacity in left base is favored to represent atelectasis. Pulmonary
venous congestion without overt edema. No acute abnormalities
otherwise seen.
IMPRESSION: Cardiomegaly and pulmonary venous congestion. Opacity in left base
is favored to represent atelectasis, not significantly changed.

## 2018-11-04 IMAGING — CR DG CHEST 1V PORT
1 series · 1 of 1 positions shown · non-contrast
Comparison: Same day

CLINICAL DATA: Sepsis.  Central line placement.

EXAM:
PORTABLE CHEST 1 VIEW

[portable]
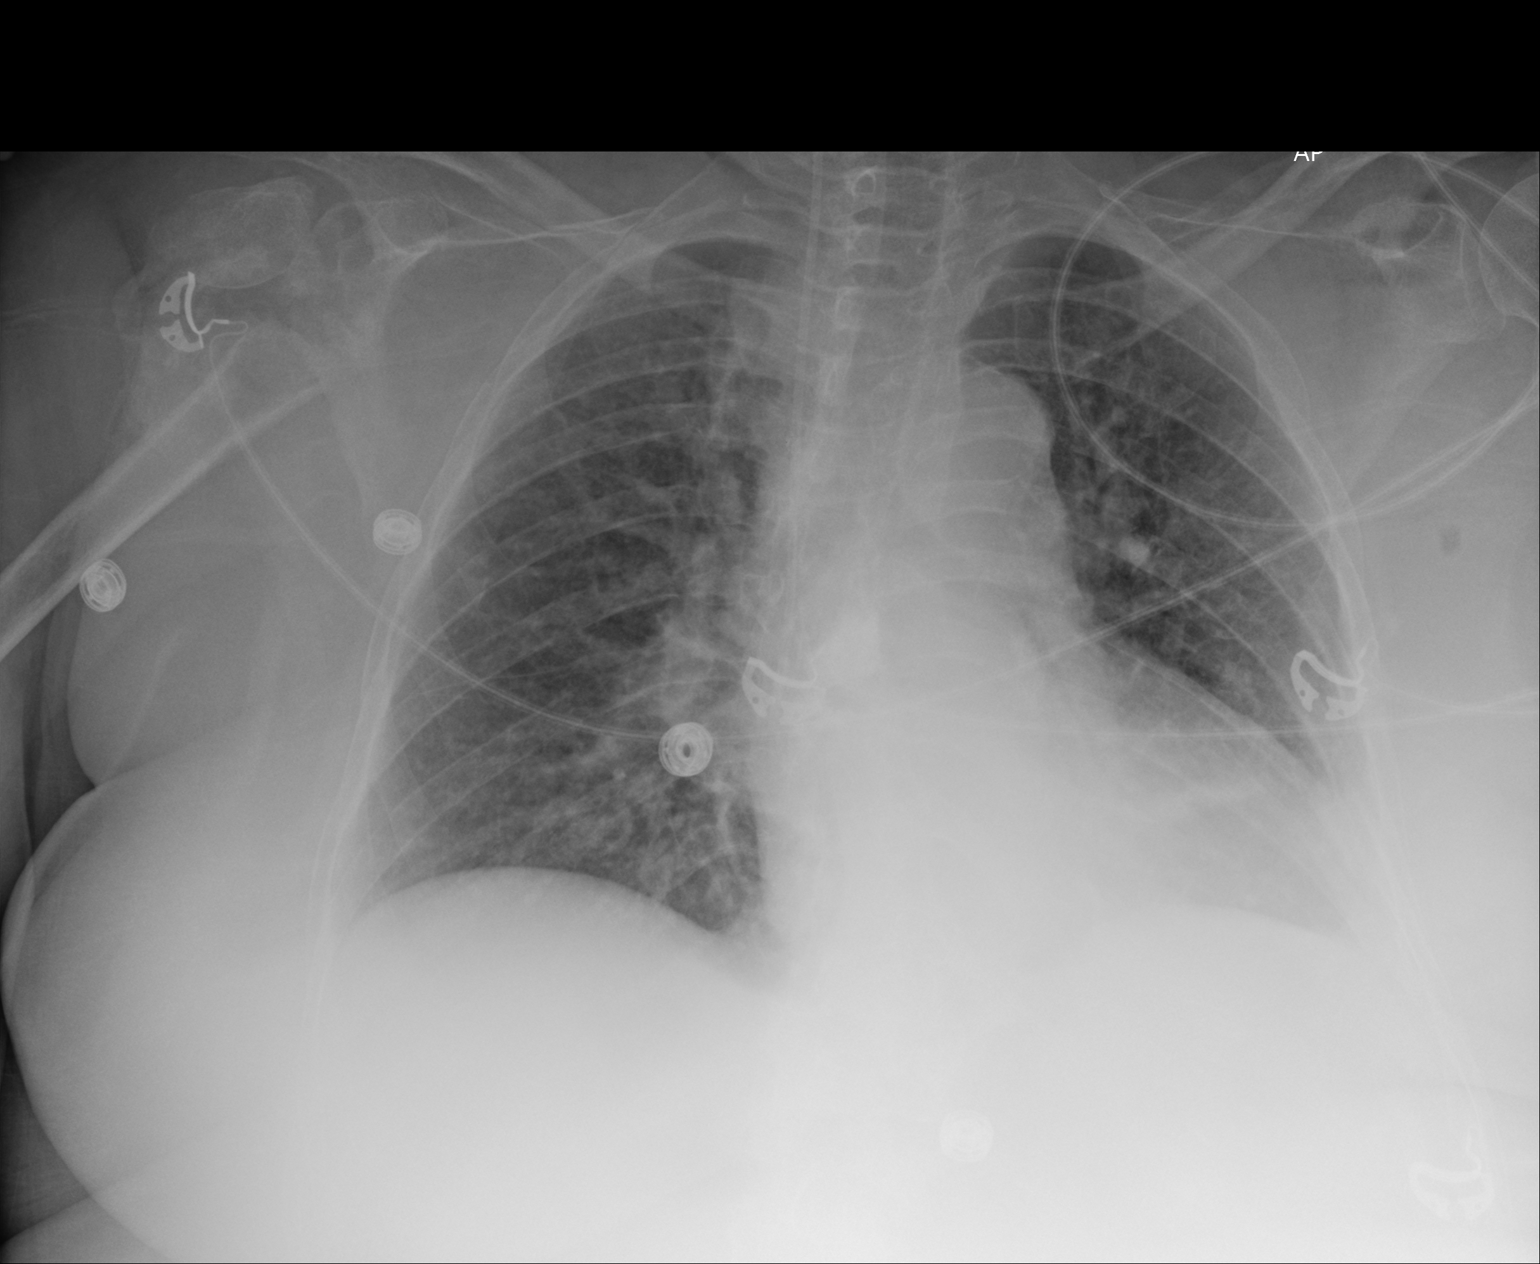

[1 of 1 positions shown; findings below may reference images not displayed]

FINDINGS: Right internal jugular central line tip is at the SVC RA junction.
No pneumothorax. Left lower lobe atelectasis persists.
IMPRESSION: Right internal jugular central line tip well positioned at the SVC
RA junction.
